# Patient Record
Sex: Female | Born: 1965 | Race: White | Hispanic: No | Marital: Married | Smoking: Never smoker
Health system: Southern US, Community
[De-identification: ages and names within clinical notes are randomized; demographics above are authoritative.]

## PROBLEM LIST (undated history)

## (undated) DIAGNOSIS — E119 Type 2 diabetes mellitus without complications: Secondary | ICD-10-CM

## (undated) DIAGNOSIS — IMO0001 Reserved for inherently not codable concepts without codable children: Secondary | ICD-10-CM

## (undated) DIAGNOSIS — G43909 Migraine, unspecified, not intractable, without status migrainosus: Secondary | ICD-10-CM

## (undated) DIAGNOSIS — K219 Gastro-esophageal reflux disease without esophagitis: Secondary | ICD-10-CM

## (undated) DIAGNOSIS — I10 Essential (primary) hypertension: Secondary | ICD-10-CM

## (undated) DIAGNOSIS — K760 Fatty (change of) liver, not elsewhere classified: Secondary | ICD-10-CM

## (undated) DIAGNOSIS — R112 Nausea with vomiting, unspecified: Secondary | ICD-10-CM

## (undated) DIAGNOSIS — Z9889 Other specified postprocedural states: Secondary | ICD-10-CM

## (undated) HISTORY — DX: Essential (primary) hypertension: I10

## (undated) HISTORY — DX: Migraine, unspecified, not intractable, without status migrainosus: G43.909

## (undated) HISTORY — PX: CHOLECYSTECTOMY: SHX55

## (undated) HISTORY — DX: Gastro-esophageal reflux disease without esophagitis: K21.9

## (undated) HISTORY — DX: Reserved for inherently not codable concepts without codable children: IMO0001

---

## 1989-02-25 HISTORY — PX: TUBAL LIGATION: SHX77

## 1997-02-25 HISTORY — PX: OTHER SURGICAL HISTORY: SHX169

## 1997-10-27 ENCOUNTER — Ambulatory Visit (HOSPITAL_COMMUNITY): Admission: RE | Admit: 1997-10-27 | Discharge: 1997-10-27 | Payer: Self-pay | Admitting: Gastroenterology

## 1997-11-30 ENCOUNTER — Encounter: Payer: Self-pay | Admitting: Gastroenterology

## 1997-11-30 ENCOUNTER — Ambulatory Visit (HOSPITAL_COMMUNITY): Admission: RE | Admit: 1997-11-30 | Discharge: 1997-11-30 | Payer: Self-pay | Admitting: Gastroenterology

## 1997-12-01 ENCOUNTER — Ambulatory Visit (HOSPITAL_COMMUNITY): Admission: RE | Admit: 1997-12-01 | Discharge: 1997-12-01 | Payer: Self-pay | Admitting: Gastroenterology

## 1997-12-01 ENCOUNTER — Encounter: Payer: Self-pay | Admitting: Gastroenterology

## 1997-12-05 ENCOUNTER — Inpatient Hospital Stay (HOSPITAL_COMMUNITY): Admission: RE | Admit: 1997-12-05 | Discharge: 1997-12-10 | Payer: Self-pay | Admitting: General Surgery

## 1998-01-24 ENCOUNTER — Ambulatory Visit (HOSPITAL_COMMUNITY): Admission: RE | Admit: 1998-01-24 | Discharge: 1998-01-24 | Payer: Self-pay | Admitting: General Surgery

## 1998-02-06 ENCOUNTER — Ambulatory Visit (HOSPITAL_COMMUNITY): Admission: RE | Admit: 1998-02-06 | Discharge: 1998-02-06 | Payer: Self-pay | Admitting: General Surgery

## 1998-02-06 ENCOUNTER — Encounter: Payer: Self-pay | Admitting: General Surgery

## 1998-07-21 ENCOUNTER — Ambulatory Visit (HOSPITAL_COMMUNITY): Admission: RE | Admit: 1998-07-21 | Discharge: 1998-07-21 | Payer: Self-pay | Admitting: Gastroenterology

## 1998-07-21 ENCOUNTER — Encounter: Payer: Self-pay | Admitting: Gastroenterology

## 1998-12-13 ENCOUNTER — Ambulatory Visit (HOSPITAL_COMMUNITY): Admission: RE | Admit: 1998-12-13 | Discharge: 1998-12-13 | Payer: Self-pay | Admitting: Gastroenterology

## 1998-12-13 ENCOUNTER — Encounter: Payer: Self-pay | Admitting: Gastroenterology

## 2000-12-12 ENCOUNTER — Other Ambulatory Visit: Admission: RE | Admit: 2000-12-12 | Discharge: 2000-12-12 | Payer: Self-pay | Admitting: Obstetrics and Gynecology

## 2000-12-24 ENCOUNTER — Ambulatory Visit (HOSPITAL_COMMUNITY): Admission: RE | Admit: 2000-12-24 | Discharge: 2000-12-24 | Payer: Self-pay | Admitting: Gastroenterology

## 2000-12-24 ENCOUNTER — Encounter (INDEPENDENT_AMBULATORY_CARE_PROVIDER_SITE_OTHER): Payer: Self-pay

## 2004-10-04 ENCOUNTER — Ambulatory Visit (HOSPITAL_COMMUNITY): Admission: RE | Admit: 2004-10-04 | Discharge: 2004-10-04 | Payer: Self-pay | Admitting: Obstetrics and Gynecology

## 2004-11-07 ENCOUNTER — Ambulatory Visit (HOSPITAL_COMMUNITY): Admission: RE | Admit: 2004-11-07 | Discharge: 2004-11-07 | Payer: Self-pay | Admitting: Obstetrics and Gynecology

## 2005-12-17 ENCOUNTER — Ambulatory Visit (HOSPITAL_COMMUNITY): Admission: RE | Admit: 2005-12-17 | Discharge: 2005-12-17 | Payer: Self-pay | Admitting: Obstetrics and Gynecology

## 2006-08-28 ENCOUNTER — Ambulatory Visit (HOSPITAL_COMMUNITY): Admission: RE | Admit: 2006-08-28 | Discharge: 2006-08-28 | Payer: Self-pay | Admitting: Family Medicine

## 2007-04-10 ENCOUNTER — Other Ambulatory Visit: Admission: RE | Admit: 2007-04-10 | Discharge: 2007-04-10 | Payer: Self-pay | Admitting: Obstetrics and Gynecology

## 2008-06-14 ENCOUNTER — Other Ambulatory Visit: Admission: RE | Admit: 2008-06-14 | Discharge: 2008-06-14 | Payer: Self-pay | Admitting: Obstetrics and Gynecology

## 2009-03-13 ENCOUNTER — Ambulatory Visit (HOSPITAL_COMMUNITY): Admission: RE | Admit: 2009-03-13 | Discharge: 2009-03-13 | Payer: Self-pay | Admitting: Obstetrics and Gynecology

## 2009-06-19 ENCOUNTER — Ambulatory Visit (HOSPITAL_COMMUNITY): Admission: RE | Admit: 2009-06-19 | Discharge: 2009-06-19 | Payer: Self-pay | Admitting: Family Medicine

## 2010-03-18 ENCOUNTER — Encounter: Payer: Self-pay | Admitting: Obstetrics and Gynecology

## 2010-07-13 NOTE — Procedures (Signed)
[facility]  Patient:    Jeanette Pierce, Jeanette Pierce Visit Number: [id number] MRN: [id number]          Service Type: Attending:  Verlin Grills, M.D. Dictated by:   Verlin Grills, M.D. Proc. Date: 12/24/00   CC:         Sharlet Salina T. Hoxworth, M.D.  Venita Lick. Pleas Koch., M.D. [facility]   Procedure Report  PROCEDURE PERFORMED:  Colonoscopy.  INDICATION FOR PROCEDURE:  Jeanette Pierce (date of birth 1965/09/26) is a 45 year old female.  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  She underwent removal of her distal ileum, cecum, ileocecal valve, appendix and distal ascending colon by Dr. Glenna Fellows.  She has also undergone a laparoscopic cholecystectomy and tubal ligation.  She is allergic to BACTRIM and ERYTHROMYCIN.  Jeanette Pierce is having right lower quadrant abdominal pain and watery, nonbloody diarrhea.  ENDOSCOPIST:  Verlin Grills, M.D.  PREMEDICATION:  Versed 10 mg, Demerol 50 mg.  ENDOSCOPE: Olympus pediatric colonoscope.  DESCRIPTION OF PROCEDURE:  After obtaining informed consent, Jeanette Pierce was placed in the left lateral decubitus position.  I administered intravenous Versed and intravenous Demerol to achieve conscious sedation for the procedure.  The patients blood pressure, oxygen saturation and cardiac rhythm were monitored throughout the procedure and documented in the medical record.  Anal inspection was normal.  Digital rectal exam was normal.  The Olympus pediatric video colonoscope was introduced into the rectum and easily advanced to what appears to be an end-to-end small bowel - right colon anastomosis. Colonic preparation for the exam today was excellent.  Rectum normal.  Sigmoid colon and descending colon normal.  Splenic flexure normal.  Transverse colon normal.  Hepatic flexure normal.  Ascending colon normal.  Ileal right colonic anastomosis normal.  Distal ileum  normal.  ASSESSMENT:  Normal proctocolonoscopy to the ileal right colonic surgical anastomosis; normal appearing distal ileum.  Multiple biopsies were taken through the length of the colon to rule out microscopic - collagenous colitis. Dictated by:   Verlin Grills, M.D. Attending:  Verlin Grills, M.D. DD:  12/24/00 TD:  12/25/00 Job: [id number]

## 2012-06-24 ENCOUNTER — Other Ambulatory Visit: Payer: Self-pay | Admitting: Obstetrics and Gynecology

## 2012-09-06 ENCOUNTER — Other Ambulatory Visit: Payer: Self-pay | Admitting: Family Medicine

## 2012-09-16 ENCOUNTER — Encounter: Payer: Self-pay | Admitting: Family Medicine

## 2012-09-16 ENCOUNTER — Ambulatory Visit (INDEPENDENT_AMBULATORY_CARE_PROVIDER_SITE_OTHER): Payer: Self-pay | Admitting: Family Medicine

## 2012-09-16 DIAGNOSIS — K219 Gastro-esophageal reflux disease without esophagitis: Secondary | ICD-10-CM

## 2012-09-16 DIAGNOSIS — G43909 Migraine, unspecified, not intractable, without status migrainosus: Secondary | ICD-10-CM | POA: Insufficient documentation

## 2012-09-16 MED ORDER — ATENOLOL 100 MG PO TABS: 100.0000 mg | ORAL_TABLET | Freq: Every day | ORAL | Status: DC

## 2012-09-16 MED ORDER — IMIPRAMINE HCL 25 MG PO TABS: ORAL_TABLET | ORAL | Status: DC

## 2012-09-16 NOTE — Progress Notes (Signed)
  Subjective:    Patient ID: Jeanette Pierce, female    DOB: 07/13/1965, 47 y.o.   MRN: [id number]  Hypertension This is a chronic problem. The current episode started more than 1 year ago. The problem is unchanged. Pertinent negatives include no anxiety, blurred vision or chest pain. There are no associated agents to hypertension. Past treatments include beta blockers. The current treatment provides moderate improvement. There are no compliance problems.  There is no history of CVA.   prophylacitic rx still helping. Mod headaches around twice per wk On grapefruit diet.cholesterol in the past--good.  Review of Systems  Eyes: Negative for blurred vision.  Cardiovascular: Negative for chest pain.   Takes ibuprofen. Four to six ibu's as need ed for ha's     Objective:   Physical Exam  Alert no acute distress. HEENT normal. Neuro exam intact. Lungs clear heart regular in rhythm      Assessment & Plan:  In impression chronic migraine headaches stable on current medication. Plan medications refilled. Diet exercise discussed. WSL

## 2012-10-02 ENCOUNTER — Telehealth: Payer: Self-pay | Admitting: Family Medicine

## 2012-10-02 NOTE — Telephone Encounter (Signed)
Discussed with pt take a home pregnancy test and carolyn will do bw papers and she can pick up on monday

## 2012-10-02 NOTE — Telephone Encounter (Signed)
Pt having headaches and nausea. Menstrual cramps but no cycle since 08/27/12. Last 2 cycles have been 2 weeks apart. She is not taking birth control and has not taken a pregnancy test. Wants to know if she can have test done for menopause.

## 2012-10-02 NOTE — Telephone Encounter (Signed)
Patient says that she doesn't know if she is starting menopause or if she is pregnant .Marland Kitchen She would like someone to call her back to determine what is going on so she doesn't have to take a test right away.

## 2012-10-05 ENCOUNTER — Other Ambulatory Visit: Payer: Self-pay | Admitting: Nurse Practitioner

## 2012-10-05 DIAGNOSIS — N912 Amenorrhea, unspecified: Secondary | ICD-10-CM

## 2012-10-05 NOTE — Telephone Encounter (Signed)
Please call patient at work [phone] and let her know when Jeanette Pierce does get her paper work done, stopped by on Monday AM to get papers but not ready, does she need to go fasting for this blood work?

## 2012-10-05 NOTE — Telephone Encounter (Signed)
Blood work papers left up front for patient pick up. Patient notified

## 2012-10-05 NOTE — Telephone Encounter (Signed)
No bloodwork for hormones does not have to be fasting.  I will order labs and send to printer.

## 2012-10-06 LAB — LUTEINIZING HORMONE: LH: 5.2 m[IU]/mL

## 2012-10-08 ENCOUNTER — Encounter: Payer: Self-pay | Admitting: Nurse Practitioner

## 2012-11-09 ENCOUNTER — Ambulatory Visit (INDEPENDENT_AMBULATORY_CARE_PROVIDER_SITE_OTHER): Payer: Self-pay | Admitting: Nurse Practitioner

## 2012-11-09 ENCOUNTER — Encounter: Payer: Self-pay | Admitting: Nurse Practitioner

## 2012-11-09 VITALS — BP 120/92 | Temp 101.1°F | Ht 62.0 in | Wt 200.6 lb

## 2012-11-09 DIAGNOSIS — J069 Acute upper respiratory infection, unspecified: Secondary | ICD-10-CM

## 2012-11-09 DIAGNOSIS — J209 Acute bronchitis, unspecified: Secondary | ICD-10-CM

## 2012-11-09 DIAGNOSIS — R0989 Other specified symptoms and signs involving the circulatory and respiratory systems: Secondary | ICD-10-CM

## 2012-11-09 MED ORDER — MEDROXYPROGESTERONE ACETATE 10 MG PO TABS: 10.0000 mg | ORAL_TABLET | Freq: Every day | ORAL | Status: DC

## 2012-11-09 MED ORDER — PREDNISONE 20 MG PO TABS: ORAL_TABLET | ORAL | Status: DC

## 2012-11-09 MED ORDER — ALBUTEROL SULFATE HFA 108 (90 BASE) MCG/ACT IN AERS: 2.0000 | INHALATION_SPRAY | RESPIRATORY_TRACT | Status: DC | PRN

## 2012-11-09 MED ORDER — ALBUTEROL SULFATE (2.5 MG/3ML) 0.083% IN NEBU
2.5000 mg | INHALATION_SOLUTION | Freq: Once | RESPIRATORY_TRACT | Status: AC
  Administered 2012-11-09: 2.5 mg via RESPIRATORY_TRACT

## 2012-11-09 MED ORDER — AMOXICILLIN 500 MG PO TABS: ORAL_TABLET | ORAL | Status: DC

## 2012-11-09 MED ORDER — METHYLPREDNISOLONE ACETATE 40 MG/ML IJ SUSP
40.0000 mg | Freq: Once | INTRAMUSCULAR | Status: AC
  Administered 2012-11-09: 40 mg via INTRAMUSCULAR

## 2012-11-09 NOTE — Patient Instructions (Signed)
Endometrial hyperplasia

## 2012-11-10 ENCOUNTER — Encounter: Payer: Self-pay | Admitting: Nurse Practitioner

## 2012-11-10 NOTE — Progress Notes (Signed)
Subjective:  Presents complaints of cough and congestion over the past week, worse over the past 3 days. Low-grade fever. Cough slightly better, producing green sputum. Some headache. Body aches. Sore throat especially with cough. No ear pain. Slight wheezing at times. No vomiting diarrhea or abdominal pain. Taking fluids well. Voiding normal limit. Some relief of cough with OTC meds.  Objective:   BP 120/92  Temp(Src) 101.1 F (38.4 C)  Ht 5\' 2"  (1.575 m)  Wt 200 lb 9.6 oz (90.992 kg)  BMI 36.68 kg/m2 NAD. Alert, oriented. TMs clear effusion, no erythema. Pharynx injected with PND noted. Neck supple with mild soft nontender adenopathy. Lungs coarse expiratory crackles noted throughout lung fields with scattered expiratory wheezes. No tachypnea. Normal color. Given albuterol 2.5 mg neb treatment. Wheezing and crackles resolved. Subjective improvement of symptoms. Heart regular rate rhythm.  Assessment:Acute bronchitis - Plan: albuterol (PROVENTIL) (2.5 MG/3ML) 0.083% nebulizer solution 2.5 mg, methylPREDNISolone acetate (DEPO-MEDROL) injection 40 mg  Reactive airway disease that is not asthma  Acute upper respiratory infections of unspecified site  Plan: Meds ordered this encounter  Medications  . predniSONE (DELTASONE) 20 MG tablet    Sig: 3 po qd x 3 d then 2 po qd x 3 d then 1 po qd x 3 d; start 9/16    Dispense:  18 tablet    Refill:  0    Order Specific Question:  Supervising Provider    Answer:  Merlyn Albert [[id number]]  . albuterol (PROVENTIL HFA;VENTOLIN HFA) 108 (90 BASE) MCG/ACT inhaler    Sig: Inhale 2 puffs into the lungs every 4 (four) hours as needed for wheezing.    Dispense:  1 Inhaler    Refill:  0    Please dispense least expensive brand; patient will be paying out of pocket    Order Specific Question:  Supervising Provider    Answer:  Merlyn Albert [[id number]]  . amoxicillin (AMOXIL) 500 MG tablet    Sig: One po TID x 10 days    Dispense:  30 tablet    Refill:  0     Order Specific Question:  Supervising Provider    Answer:  Merlyn Albert [[id number]]  . medroxyPROGESTERone (PROVERA) 10 MG tablet    Sig: Take 1 tablet (10 mg total) by mouth daily. X 7 d q 3 months if no menstrual cycle    Dispense:  7 tablet    Refill:  3    HOLD UNTIL PATIENT CALLS    Order Specific Question:  Supervising Provider    Answer:  Merlyn Albert [[id number]]  . albuterol (PROVENTIL) (2.5 MG/3ML) 0.083% nebulizer solution 2.5 mg    Sig:   . methylPREDNISolone acetate (DEPO-MEDROL) injection 40 mg    Sig:    OTC meds as directed for congestion. Warning signs reviewed. Call back in 72 hours if no improvement, sooner if worse.

## 2013-02-22 ENCOUNTER — Telehealth: Payer: Self-pay | Admitting: Family Medicine

## 2013-02-22 ENCOUNTER — Other Ambulatory Visit: Payer: Self-pay | Admitting: Nurse Practitioner

## 2013-02-22 MED ORDER — AMOXICILLIN 500 MG PO TABS: ORAL_TABLET | ORAL | Status: DC

## 2013-02-22 NOTE — Telephone Encounter (Signed)
Will call in antibiotic but recommend office visit by end of week if no better or sooner if worse.

## 2013-02-22 NOTE — Telephone Encounter (Signed)
No correct # in system (tried work #- their closed)

## 2013-02-22 NOTE — Telephone Encounter (Signed)
Patient calls with complaints of slight temperature, sore throat, cough, tightness in chest, coughing up yellowish green mucous. She said she cannot afford an OV at this time and is hoping to have something called in.  [facility]

## 2013-02-23 NOTE — Telephone Encounter (Signed)
Notified patient antibiotic was called in to pharmacy.

## 2013-03-16 ENCOUNTER — Encounter: Payer: Self-pay | Admitting: Family Medicine

## 2013-03-16 ENCOUNTER — Ambulatory Visit (INDEPENDENT_AMBULATORY_CARE_PROVIDER_SITE_OTHER): Payer: Self-pay | Admitting: Family Medicine

## 2013-03-16 VITALS — BP 154/86 | Ht 62.0 in | Wt 216.2 lb

## 2013-03-16 DIAGNOSIS — G43909 Migraine, unspecified, not intractable, without status migrainosus: Secondary | ICD-10-CM

## 2013-03-16 MED ORDER — IBUPROFEN 800 MG PO TABS: 800.0000 mg | ORAL_TABLET | Freq: Four times a day (QID) | ORAL | Status: DC | PRN

## 2013-03-16 MED ORDER — ATENOLOL 100 MG PO TABS: 100.0000 mg | ORAL_TABLET | Freq: Every day | ORAL | Status: DC

## 2013-03-16 MED ORDER — ALBUTEROL SULFATE HFA 108 (90 BASE) MCG/ACT IN AERS: 2.0000 | INHALATION_SPRAY | RESPIRATORY_TRACT | Status: DC | PRN

## 2013-03-16 MED ORDER — IMIPRAMINE HCL 25 MG PO TABS: ORAL_TABLET | ORAL | Status: DC

## 2013-03-16 NOTE — Progress Notes (Signed)
   Subjective:    Patient ID: Jeanette Pierce, female    DOB: 17-May-1965, 48 y.o.   MRN: [id number]  HPI Patient is here today for a 6 month check up on her migraines.   She states her migraines are under better control. Atenolol helping the headaches.  Works at [employer].  ibu more for the severe headches.     She would like a prescription for 800mg  Ibu.   Exercising some. But not a lot.  Review of Systems No chest pain no back pain no abdominal pain no change in bowel habits no blood in stool no rash ROS otherwise negative    Objective:   Physical Exam  Alert no apparent distress. HEENT normal. Lungs clear. Heart regular in rhythm. Blood pressure improved on repeat 140/82.      Assessment & Plan:  Impression 1 migraine headaches discussed #2 borderline blood pressure. Plan maintain same medications. Diet exercise discussed in encourage. WSL

## 2013-09-29 ENCOUNTER — Ambulatory Visit: Payer: Self-pay | Admitting: Family Medicine

## 2013-10-15 ENCOUNTER — Encounter: Payer: Self-pay | Admitting: Family Medicine

## 2013-10-15 ENCOUNTER — Ambulatory Visit (INDEPENDENT_AMBULATORY_CARE_PROVIDER_SITE_OTHER): Payer: Self-pay | Admitting: Family Medicine

## 2013-10-15 VITALS — BP 102/78 | Ht 62.0 in | Wt 212.2 lb

## 2013-10-15 DIAGNOSIS — G43119 Migraine with aura, intractable, without status migrainosus: Secondary | ICD-10-CM

## 2013-10-15 MED ORDER — IMIPRAMINE HCL 25 MG PO TABS: ORAL_TABLET | ORAL | Status: DC

## 2013-10-15 MED ORDER — ATENOLOL 100 MG PO TABS: 100.0000 mg | ORAL_TABLET | Freq: Every day | ORAL | Status: DC

## 2013-10-15 NOTE — Progress Notes (Signed)
   Subjective:    Patient ID: Jeanette Pierce, female    DOB: 1966-01-25, 48 y.o.   MRN: [id number]  HPI Patient is here for a follow up visit on her migraine headaches. Patient states that her migraines are lasting longer now. The last 2 migraines she had lasted for about 1 week.   Migraines lasting longerPatient has no other concerns at this time.   Substantial nausea and constant headache  Ibuprofen  Pain steady throb, begins with it  Gets nausea but no vomiting  Feels unsteady momentarily/    Review of Systems No loss of consciousness no seizure disorder and chest pain no neck pain no back pain no radiation to arms ROS otherwise negative    Objective:   Physical Exam  Alert talkative pleasant no acute distress. Vitals stable. Lungs clear. Heart regular in rhythm neuro exam intact      Assessment & Plan:  Impression worsening migraine headaches discussed. Patient wishes to stance same maintenance meds due to financial considerations.

## 2014-03-22 ENCOUNTER — Ambulatory Visit (INDEPENDENT_AMBULATORY_CARE_PROVIDER_SITE_OTHER): Payer: Self-pay | Admitting: Family Medicine

## 2014-03-22 ENCOUNTER — Encounter: Payer: Self-pay | Admitting: Family Medicine

## 2014-03-22 VITALS — BP 122/75 | Ht 62.0 in | Wt 215.0 lb

## 2014-03-22 DIAGNOSIS — J329 Chronic sinusitis, unspecified: Secondary | ICD-10-CM

## 2014-03-22 DIAGNOSIS — G43119 Migraine with aura, intractable, without status migrainosus: Secondary | ICD-10-CM

## 2014-03-22 MED ORDER — IMIPRAMINE HCL 25 MG PO TABS: ORAL_TABLET | ORAL | Status: DC

## 2014-03-22 MED ORDER — AMOXICILLIN 500 MG PO CAPS: 500.0000 mg | ORAL_CAPSULE | Freq: Three times a day (TID) | ORAL | Status: DC

## 2014-03-22 MED ORDER — ATENOLOL 100 MG PO TABS: 100.0000 mg | ORAL_TABLET | Freq: Every day | ORAL | Status: DC

## 2014-03-22 NOTE — Progress Notes (Signed)
   Subjective:    Patient ID: Rhea Pinkaquel H Parcel, female    DOB: 14-May-1965, 49 y.o.   MRN: [id number]  HPI Patient is here today for a 6 month check up.  She needs a refill on her meds.  migr ha's more freq, two or three in the last 6 mo that were difficult  Outlet adult color ing books  Diet disc not the best Drinking some sugar in th4 diet  Headache and sinus cong and frontal, sig coughing and hacking, taking alka selt cold and flu, still bring up green gunky xstuffe C/o cold since last Thursday. Taking OTC meds.     Review of Systems No fever no chills good cough decent appetite no chest pain no abdominal pain    Objective:   Physical Exam Alert vitals stable HET moderate nasal congestion frontal tenderness pharynx normal neck supple lungs clear heart regular rhythm       Assessment & Plan:  Impression 1 acute rhinosinusitis #2 migraine headaches fairly stable plan chronic meds refilled. Diet exercise discussed preventive measures encouraged but declined due to no insurance a mocks 500 3 times a day 10 days. WSL

## 2014-05-13 ENCOUNTER — Other Ambulatory Visit: Payer: Self-pay | Admitting: Family Medicine

## 2014-06-15 ENCOUNTER — Other Ambulatory Visit: Payer: Self-pay | Admitting: Family Medicine

## 2014-09-13 ENCOUNTER — Other Ambulatory Visit: Payer: Self-pay | Admitting: Family Medicine

## 2014-09-21 ENCOUNTER — Ambulatory Visit (INDEPENDENT_AMBULATORY_CARE_PROVIDER_SITE_OTHER): Payer: Self-pay | Admitting: Nurse Practitioner

## 2014-09-21 ENCOUNTER — Encounter: Payer: Self-pay | Admitting: Nurse Practitioner

## 2014-09-21 VITALS — BP 116/72 | Ht 62.0 in | Wt 223.0 lb

## 2014-09-21 DIAGNOSIS — G43009 Migraine without aura, not intractable, without status migrainosus: Secondary | ICD-10-CM

## 2014-09-21 MED ORDER — ATENOLOL 100 MG PO TABS
100.0000 mg | ORAL_TABLET | Freq: Every day | ORAL | Status: DC
Start: 2014-09-21 — End: 2015-03-19

## 2014-09-21 MED ORDER — IMIPRAMINE HCL 25 MG PO TABS: ORAL_TABLET | ORAL | Status: DC

## 2014-09-21 NOTE — Progress Notes (Signed)
Subjective:  Presents for recheck of her migraine headaches. Stable. Rare headache. Mild headache usually relieved with rest and a light snack. Has no insurance at this time. Defers physical and lab work.  Objective:   BP 116/72 mmHg  Ht  (1.575 m)  Wt 223 lb (101.152 kg)  BMI 40.78 kg/m2 NAD. Alert, oriented. Lungs clear. Heart regular rate rhythm.  Assessment: Migraine without aura and without status migrainosus, not intractable  Plan:  Meds ordered this encounter  Medications  . imipramine (TOFRANIL) 25 MG tablet    Sig: Take four tabs qhs    Dispense:  360 tablet    Refill:  1    Order Specific Question:  Supervising Provider    Answer:  Merlyn Albert [[id number]]  . atenolol (TENORMIN) 100 MG tablet    Sig: Take 1 tablet (100 mg total) by mouth daily.    Dispense:  90 tablet    Refill:  0    Order Specific Question:  Supervising Provider    Answer:  Merlyn Albert [[id number]]   Continue current medications as directed. If patient gets insurance, recommend preventive health physical. Return in about 6 months (around 03/24/2015) for recheck.

## 2015-03-19 ENCOUNTER — Other Ambulatory Visit: Payer: Self-pay | Admitting: Nurse Practitioner

## 2015-03-21 ENCOUNTER — Ambulatory Visit (INDEPENDENT_AMBULATORY_CARE_PROVIDER_SITE_OTHER): Payer: Self-pay | Admitting: Family Medicine

## 2015-03-21 ENCOUNTER — Encounter: Payer: Self-pay | Admitting: Family Medicine

## 2015-03-21 VITALS — BP 120/70 | Ht 62.0 in | Wt 225.2 lb

## 2015-03-21 DIAGNOSIS — G43009 Migraine without aura, not intractable, without status migrainosus: Secondary | ICD-10-CM

## 2015-03-21 MED ORDER — IMIPRAMINE HCL 25 MG PO TABS: ORAL_TABLET | ORAL | Status: DC

## 2015-03-21 MED ORDER — ATENOLOL 100 MG PO TABS: ORAL_TABLET | ORAL | Status: DC

## 2015-03-21 NOTE — Progress Notes (Signed)
   Subjective:    Patient ID: Jeanette Pierce, female    DOB: 1965-10-08, 50 y.o.   MRN: [id number]  HPI Patient is here today for a follow up visit on migraines. Patient is doing well. Patient does have some concerns about her weight.   Patient states overall chronic meds for controlling her migraines well. Has a severe 11 twice per month. Generally Motrin helps it.   Admits to virtually no exercise. Admits to admits to difficulty with snacking in the evening time.   Minimal trouble with wheezing   Review of Systems No chest pain no vomiting no diarrhea ROS otherwise negative    Objective:   Physical Exam  Alert vitals stable. HEENT normal lungs clear heart regular in rhythm neuro intact      Assessment & Plan:  Impression migraine headaches good control discussed maintain same meds #2 obesity discussed plan diet exercise discussed meds refilled recheck in 6 months WSL of note patient is not insured and she declines all preventive measures I've encouraged her to reconsider particularly important intervention such as regular mammograms etc.

## 2015-03-24 ENCOUNTER — Ambulatory Visit: Payer: Self-pay | Admitting: Nurse Practitioner

## 2015-07-20 ENCOUNTER — Other Ambulatory Visit: Payer: Self-pay | Admitting: Family Medicine

## 2015-09-17 ENCOUNTER — Other Ambulatory Visit: Payer: Self-pay | Admitting: Family Medicine

## 2015-09-18 ENCOUNTER — Ambulatory Visit (INDEPENDENT_AMBULATORY_CARE_PROVIDER_SITE_OTHER): Payer: Self-pay | Admitting: Family Medicine

## 2015-09-18 ENCOUNTER — Encounter: Payer: Self-pay | Admitting: Family Medicine

## 2015-09-18 VITALS — BP 122/82 | Ht 62.0 in | Wt 210.6 lb

## 2015-09-18 DIAGNOSIS — G43009 Migraine without aura, not intractable, without status migrainosus: Secondary | ICD-10-CM

## 2015-09-18 MED ORDER — IBUPROFEN 800 MG PO TABS: 800.0000 mg | ORAL_TABLET | Freq: Four times a day (QID) | ORAL | 2 refills | Status: DC | PRN

## 2015-09-18 MED ORDER — ATENOLOL 100 MG PO TABS: ORAL_TABLET | ORAL | 1 refills | Status: DC

## 2015-09-18 MED ORDER — IMIPRAMINE HCL 25 MG PO TABS: ORAL_TABLET | ORAL | 1 refills | Status: DC

## 2015-09-18 NOTE — Progress Notes (Signed)
   Subjective:    Patient ID: Jeanette Pierce, female    DOB: [birth year], 50 y.o.   MRN: [id number]  HPI  Patient arrives for a follow up on migraines. Patient states she has been doing well but has noticed more headaches during the heat  Takes both meds qhs, if delays by an hour or tow   Rarely misses  Uses ibu 800 prn for an actual ha   Not much nausea  Not bad lately    Review of Systems No headache, no major weight loss or weight gain, no chest pain no back pain abdominal pain no change in bowel habits complete ROS otherwise negative     Objective:   Physical Exam Alert vital stable no acute distress blood pressure good on repeat HEENT normal lungs clear heart regular rate and rhythm neuro intact       Assessment & Plan:  Impression migraine headaches overall decent control plan compliance discussed medications refilled diet exercise discussed recheck in 6 months WSL

## 2015-09-19 ENCOUNTER — Other Ambulatory Visit: Payer: Self-pay | Admitting: Family Medicine

## 2016-02-09 ENCOUNTER — Other Ambulatory Visit: Payer: Self-pay | Admitting: Family Medicine

## 2016-02-09 DIAGNOSIS — Z1231 Encounter for screening mammogram for malignant neoplasm of breast: Secondary | ICD-10-CM

## 2016-02-24 ENCOUNTER — Other Ambulatory Visit: Payer: Self-pay | Admitting: Family Medicine

## 2016-02-27 NOTE — Telephone Encounter (Signed)
Ok times three mo since stable

## 2016-03-04 ENCOUNTER — Encounter (HOSPITAL_COMMUNITY): Payer: Self-pay | Admitting: Radiology

## 2016-03-04 ENCOUNTER — Ambulatory Visit (HOSPITAL_COMMUNITY)
Admission: RE | Admit: 2016-03-04 | Discharge: 2016-03-04 | Disposition: A | Discharge status: Home / Self Care | Payer: Self-pay | Source: Ambulatory Visit | Attending: Family Medicine | Admitting: Family Medicine

## 2016-03-04 DIAGNOSIS — Z1231 Encounter for screening mammogram for malignant neoplasm of breast: Secondary | ICD-10-CM | POA: Insufficient documentation

## 2016-03-18 ENCOUNTER — Ambulatory Visit: Payer: Self-pay | Admitting: Family Medicine

## 2016-03-25 ENCOUNTER — Encounter: Payer: Self-pay | Admitting: Family Medicine

## 2016-03-25 ENCOUNTER — Ambulatory Visit (INDEPENDENT_AMBULATORY_CARE_PROVIDER_SITE_OTHER): Payer: Self-pay | Admitting: Family Medicine

## 2016-03-25 VITALS — BP 124/76 | Ht 62.0 in | Wt 226.0 lb

## 2016-03-25 DIAGNOSIS — G43009 Migraine without aura, not intractable, without status migrainosus: Secondary | ICD-10-CM

## 2016-03-25 MED ORDER — ATENOLOL 100 MG PO TABS: ORAL_TABLET | ORAL | 1 refills | Status: DC

## 2016-03-25 MED ORDER — IMIPRAMINE HCL 25 MG PO TABS: 100.0000 mg | ORAL_TABLET | Freq: Every day | ORAL | 1 refills | Status: DC

## 2016-03-25 NOTE — Progress Notes (Signed)
   Subjective:    Patient ID: Jeanette Pierce, female    DOB: 02/09/1966, 51 y.o.   MRN: [id number]  Hypertension  This is a chronic problem. Compliance problems include exercise and diet.   Actually patient takes atenolol primarily for control of migraine headaches and not blood pressure  migr stil under good control Acid reflux. Taking tums. Sig these days. Pt takes tums, helps quickly, goes away.  Cycles are off. Every other month all last yr,  No cycle from October to January. Mid jan had a cycle that was rather solid ,       Review of Systems No headache, no major weight loss or weight gain, no chest pain no back pain abdominal pain no change in bowel habits complete ROS otherwise negative     Objective:   Physical Exam Alert vitals stable, NAD. Blood pressure good on repeat. HEENT normal. Lungs clear. Heart regular rate and rhythm.        Assessment & Plan:  Impression #1 migraine headaches good control #3 irregular cycles with occasional hot flashes likely nearing menopause discussed plan medications refilled. 15 minutes spent most in discussion. Follow-up in 6 months declines blood work due to no insurance status

## 2016-09-03 ENCOUNTER — Other Ambulatory Visit: Payer: Self-pay | Admitting: Family Medicine

## 2016-09-03 NOTE — Telephone Encounter (Signed)
Last seen 03/25/16 for Migraine

## 2016-09-16 ENCOUNTER — Encounter: Payer: Self-pay | Admitting: Family Medicine

## 2016-09-16 ENCOUNTER — Ambulatory Visit (INDEPENDENT_AMBULATORY_CARE_PROVIDER_SITE_OTHER): Payer: Self-pay | Admitting: Family Medicine

## 2016-09-16 VITALS — BP 124/96 | Ht 62.0 in | Wt 227.0 lb

## 2016-09-16 DIAGNOSIS — G43109 Migraine with aura, not intractable, without status migrainosus: Secondary | ICD-10-CM

## 2016-09-16 MED ORDER — IMIPRAMINE HCL 25 MG PO TABS: 100.0000 mg | ORAL_TABLET | Freq: Every day | ORAL | 1 refills | Status: DC

## 2016-09-16 MED ORDER — ATENOLOL 100 MG PO TABS: ORAL_TABLET | ORAL | 1 refills | Status: DC

## 2016-09-16 NOTE — Progress Notes (Signed)
   Subjective:    Patient ID: Jeanette Pierce, female    DOB: 08/10/1965, 51 y.o.   MRN: [id number]  Hypertension  This is a recurrent problem. The current episode started more than 1 year ago.  Is a    : A he should supplement patient is taking in hopes of helping lose weight  Patient states she is here for migraines and states she does not diet, and does not exercise as she needs to.  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 Blood pressure good on repeat. HEENT normal. Lungs clear. Heart regular rate and rhythm.        Assessment & Plan:  Imp migr h a's good control, meds refilled exrcise disc

## 2017-03-19 ENCOUNTER — Ambulatory Visit: Payer: Self-pay | Admitting: Family Medicine

## 2017-03-19 ENCOUNTER — Encounter: Payer: Self-pay | Admitting: Family Medicine

## 2017-03-19 VITALS — BP 134/90 | Ht 62.0 in | Wt 235.0 lb

## 2017-03-19 DIAGNOSIS — G43109 Migraine with aura, not intractable, without status migrainosus: Secondary | ICD-10-CM

## 2017-03-19 MED ORDER — ATENOLOL 100 MG PO TABS: ORAL_TABLET | ORAL | 3 refills | Status: DC

## 2017-03-19 MED ORDER — IMIPRAMINE HCL 25 MG PO TABS: 100.0000 mg | ORAL_TABLET | Freq: Every day | ORAL | 3 refills | Status: DC

## 2017-03-19 NOTE — Progress Notes (Signed)
   Subjective:    Patient ID: Jeanette Pierce H Amore, female    DOB: 1965-05-31, 52 y.o.   MRN: [id number]  Hypertension  This is a chronic problem. Compliance problems include diet and exercise.    Pt declines flu vaccine.   Pt states no concerns today.    Two or three times per month gets migraines h a's  Now wearing new glasses   Pt not exercising    Review of Systems No headache, no major weight loss or weight gain, no chest pain no back pain abdominal pain no change in bowel habits complete ROS otherwise negative     Objective:   Physical Exam Alert vitals stable, NAD. Blood pressure good on repeat. HEENT normal. Lungs clear. Heart regular rate and rhythm.        Assessment & Plan:  Impression migraine headaches.  Medication definitely helping.  No negative side effects.  Diet exercise discussed.  Patient declines all preventive screening mammograms future colonoscopy and physicals etc.

## 2017-05-24 LAB — POCT GLYCOSYLATED HEMOGLOBIN (HGB A1C)

## 2017-05-24 LAB — GLUCOSE, POCT (MANUAL RESULT ENTRY): POC GLUCOSE: 232 mg/dL — AB (ref 70–99)

## 2017-05-30 ENCOUNTER — Encounter: Payer: Self-pay | Admitting: Nurse Practitioner

## 2017-05-30 ENCOUNTER — Ambulatory Visit: Payer: Self-pay | Admitting: Nurse Practitioner

## 2017-05-30 VITALS — BP 132/80 | Ht 62.0 in | Wt 236.4 lb

## 2017-05-30 DIAGNOSIS — Z79899 Other long term (current) drug therapy: Secondary | ICD-10-CM

## 2017-05-30 DIAGNOSIS — E119 Type 2 diabetes mellitus without complications: Secondary | ICD-10-CM

## 2017-05-30 DIAGNOSIS — K219 Gastro-esophageal reflux disease without esophagitis: Secondary | ICD-10-CM

## 2017-05-30 DIAGNOSIS — Z13220 Encounter for screening for lipoid disorders: Secondary | ICD-10-CM

## 2017-05-30 MED ORDER — METFORMIN HCL ER 500 MG PO TB24: 500.0000 mg | ORAL_TABLET | Freq: Every day | ORAL | 2 refills | Status: DC

## 2017-05-30 NOTE — Progress Notes (Signed)
Subjective:    Patient ID: Rhea Pink, female    DOB: Feb 20, 1966, 52 y.o.   MRN: [id number]  HPI Patient presents today after attending a health screening through her employer that demonstrated a Hgb A1C of 6.5 and a random glucose testing of 232. She has never been diagnosed with diabetes. She denies polyphagia, polydipsia, or polyuria. She denies numbness or tingling in BLE. She denies chest pain or shortness of breath. Last eye exam was performed at the end of 2018. Last Dental exam at the beginning of 2018. Patient complaints of epigastric pain. She reports often eating late at night but denies smoking, caffeine intake, or alcohol use. She uses tums 2-3 times per week that alleviates the symptoms.   Past Medical History:  Diagnosis Date  . Hypertension   . Migraine headache   . Reflux    Past Surgical History:  Procedure Laterality Date  . CHOLECYSTECTOMY  before 1999  . iliocolotomy  1999  . TUBAL LIGATION  1991   Family History  Problem Relation Age of Onset  . Heart disease Mother   . COPD Father   . Asthma Father   . Cancer Maternal Grandfather        colon and stomach   Social History   Tobacco Use  . Smoking status: Never Smoker  . Smokeless tobacco: Never Used  Substance Use Topics  . Alcohol use: Not on file  . Drug use: Not on file   Occupation: Recently unemployed Living Environment: Lives with husband at home Caffeine: Minimal caffeine use.  Diet: Breakfast is usually cereal or oatmeal. Lunch is often a salad or sandwich. Dinner is often skipped and eats late night snacks.  Exercise: Denies any current physical activity  Allergies  Allergen Reactions  . Erythromycin     Nausea, vomiting    Current Outpatient Medications:  .  atenolol (TENORMIN) 100 MG tablet, TAKE 1 TABLET (100 MG TOTAL) BY MOUTH DAILY., Disp: 90 tablet, Rfl: 3 .  ibuprofen (ADVIL,MOTRIN) 800 MG tablet, TAKE 1 TABLET BY MOUTH EVERY 6 HOURS AS NEEDED, Disp: 36 tablet, Rfl: 2 .   imipramine (TOFRANIL) 25 MG tablet, Take 4 tablets (100 mg total) by mouth at bedtime., Disp: 360 tablet, Rfl: 3 .  Rizatriptan Benzoate (MAXALT PO), Take 10 mg by mouth. , Disp: , Rfl:    Review of Systems General: Pt reports adequate level with a regular sleeping pattern. Pt denies unexplained weight loss or weight gain.  Cardiac: Denies chest pain, pressure, or palpitations. Pt denies syncope or dizziness.  Pulmonary: Denies shortness of breath, dyspnea, or cough.   Endocrine: Denies polydipsia, polyphagia, polyuria. Also denies intolerance or sensitivity to heat or cold.       Objective:   Physical Exam  Vitals:   05/30/17 1006  BP: 132/80     General: WDWN. Appears calm and well-groomed. Dressed appropriate for weather. Thyroid: Midline and non tender with no visible enlargement, nodules, or masses on palpation.  Pulmonary: Chest expansion symmetrical. No use of accessory muscles. lungs without adventitious breath sounds on auscultation.  Cardiovascular: RRR. S1 S2 intact. No murmurs or gallops. Pulses: dorsalis pedis and posterior tibial +2. No peripheral edema.  Abdomen: Soft with mild tenderness over gastric area. No masses or organomegaly noted on palpation.   Diabetic Foot Exam - Simple   Simple Foot Form Diabetic Foot exam was performed with the following findings:  Yes 05/30/2017  1:05 PM  Visual Inspection No deformities, no ulcerations, no other  skin breakdown bilaterally:  Yes Sensation Testing Intact to touch and monofilament testing bilaterally:  Yes Pulse Check Posterior Tibialis and Dorsalis pulse intact bilaterally:  Yes Comments        Assessment & Plan:   Problem List Items Addressed This Visit      Digestive   Esophageal reflux     Endocrine   Type 2 diabetes mellitus without complication, without long-term current use of insulin (HCC) - Primary   Relevant Medications   metFORMIN (GLUCOPHAGE-XR) 500 MG 24 hr tablet    Other Visit Diagnoses     High risk medication use       Relevant Orders   Hepatic function panel   Basic metabolic panel   TSH   Lipid screening       Relevant Orders   Lipid panel       Plan -Labs: Lipid panel, liver function, BMP, and TSH. Limited labs due to lack of insurance.  -Medications: Begin taking metformin. Potential ADE discussed. -Patient teaching: Patient pap smear 2-3 years over due. Discussed with patient the importance of receiving a pap smear, but patient unable to afford at this time due to recent unemployment. Healthy diet, increasing activity, and reducing stress discussed. Decrease sugar and simple carbs in diet. Given Rx for glucose testing machine and supplies. Check sugar at least 3 times per week, record results and call back in a few weeks if no improvement.  Bring record to next visit.  -Follow Up: Return in 3 months for recheck  Return in about 3 months (around 08/29/2017).

## 2017-05-30 NOTE — Patient Instructions (Addendum)
Diabetes Mellitus and Nutrition When you have diabetes (diabetes mellitus), it is very important to have healthy eating habits because your blood sugar (glucose) levels are greatly affected by what you eat and drink. Eating healthy foods in the appropriate amounts, at about the same times every day, can help you:  Control your blood glucose.  Lower your risk of heart disease.  Improve your blood pressure.  Reach or maintain a healthy weight.  Every person with diabetes is different, and each person has different needs for a meal plan. Your health care provider may recommend that you work with a diet and nutrition specialist (dietitian) to make a meal plan that is best for you. Your meal plan may vary depending on factors such as:  The calories you need.  The medicines you take.  Your weight.  Your blood glucose, blood pressure, and cholesterol levels.  Your activity level.  Other health conditions you have, such as heart or kidney disease.  How do carbohydrates affect me? Carbohydrates affect your blood glucose level more than any other type of food. Eating carbohydrates naturally increases the amount of glucose in your blood. Carbohydrate counting is a method for keeping track of how many carbohydrates you eat. Counting carbohydrates is important to keep your blood glucose at a healthy level, especially if you use insulin or take certain oral diabetes medicines. It is important to know how many carbohydrates you can safely have in each meal. This is different for every person. Your dietitian can help you calculate how many carbohydrates you should have at each meal and for snack. Foods that contain carbohydrates include:  Bread, cereal, rice, pasta, and crackers.  Potatoes and corn.  Peas, beans, and lentils.  Milk and yogurt.  Fruit and juice.  Desserts, such as cakes, cookies, ice cream, and candy.  How does alcohol affect me? Alcohol can cause a sudden decrease in blood  glucose (hypoglycemia), especially if you use insulin or take certain oral diabetes medicines. Hypoglycemia can be a life-threatening condition. Symptoms of hypoglycemia (sleepiness, dizziness, and confusion) are similar to symptoms of having too much alcohol. If your health care provider says that alcohol is safe for you, follow these guidelines:  Limit alcohol intake to no more than 1 drink per day for nonpregnant women and 2 drinks per day for men. One drink equals 12 oz of beer, 5 oz of wine, or 1 oz of hard liquor.  Do not drink on an empty stomach.  Keep yourself hydrated with water, diet soda, or unsweetened iced tea.  Keep in mind that regular soda, juice, and other mixers may contain a lot of sugar and must be counted as carbohydrates.  What are tips for following this plan? Reading food labels  Start by checking the serving size on the label. The amount of calories, carbohydrates, fats, and other nutrients listed on the label are based on one serving of the food. Many foods contain more than one serving per package.  Check the total grams (g) of carbohydrates in one serving. You can calculate the number of servings of carbohydrates in one serving by dividing the total carbohydrates by 15. For example, if a food has 30 g of total carbohydrates, it would be equal to 2 servings of carbohydrates.  Check the number of grams (g) of saturated and trans fats in one serving. Choose foods that have low or no amount of these fats.  Check the number of milligrams (mg) of sodium in one serving. Most people   the number of grams (g) of saturated and trans fats in one serving. Choose foods that have low or no amount of these fats.  · Check the number of milligrams (mg) of sodium in one serving. Most people should limit total sodium intake to less than 2,300 mg per day.  · Always check the nutrition information of foods labeled as "low-fat" or "nonfat". These foods may be higher in added sugar or refined carbohydrates and should be avoided.  · Talk to your dietitian to identify your daily goals for nutrients listed on the label.  Shopping  · Avoid buying canned, premade, or processed foods. These  foods tend to be high in fat, sodium, and added sugar.  · Shop around the outside edge of the grocery store. This includes fresh fruits and vegetables, bulk grains, fresh meats, and fresh dairy.  Cooking  · Use low-heat cooking methods, such as baking, instead of high-heat cooking methods like deep frying.  · [place] using healthy oils, such as olive, canola, or sunflower oil.  · Avoid cooking with butter, cream, or high-fat meats.  Meal planning  · Eat meals and snacks regularly, preferably at the same times every day. Avoid going long periods of time without eating.  · Eat foods high in fiber, such as fresh fruits, vegetables, beans, and whole grains. Talk to your dietitian about how many servings of carbohydrates you can eat at each meal.  · Eat 4-6 ounces of lean protein each day, such as lean meat, chicken, fish, eggs, or tofu. 1 ounce is equal to 1 ounce of meat, chicken, or fish, 1 egg, or 1/4 cup of tofu.  · Eat some foods each day that contain healthy fats, such as avocado, nuts, seeds, and fish.  Lifestyle    · Check your blood glucose regularly.  · Exercise at least 30 minutes 5 or more days each week, or as told by your health care provider.  · Take medicines as told by your health care provider.  · Do not use any products that contain nicotine or tobacco, such as cigarettes and e-cigarettes. If you need help quitting, ask your health care provider.  · Work with a counselor or diabetes educator to identify strategies to manage stress and any emotional and social challenges.  What are some questions to ask my health care provider?  · Do I need to meet with a diabetes educator?  · Do I need to meet with a dietitian?  · What number can I call if I have questions?  · When are the best times to check my blood glucose?  Where to find more information:  · American Diabetes Association: diabetes.org/food-and-fitness/food  · Academy of Nutrition and Dietetics:  www.eatright.org/resources/health/diseases-and-conditions/diabetes  · National Institute of Diabetes and Digestive and Kidney Diseases (NIH): www.niddk.nih.gov/health-information/diabetes/overview/diet-eating-physical-activity  Summary  · A healthy meal plan will help you control your blood glucose and maintain a healthy lifestyle.  · Working with a diet and nutrition specialist (dietitian) can help you make a meal plan that is best for you.  · Keep in mind that carbohydrates and alcohol have immediate effects on your blood glucose levels. It is important to count carbohydrates and to use alcohol carefully.  This information is not intended to replace advice given to you by your health care provider. Make sure you discuss any questions you have with your health care provider.  Document Released: 11/08/2004 Document Revised: 03/18/2016 Document Reviewed: 03/18/2016  Elsevier Interactive Patient Education © 2018 Elsevier Inc.      This helps you identify foods that cause symptoms.  Avoid foods that cause symptoms. These may be different for everyone.  Eat small meals often instead of 3 large meals a day.  Eat your meals slowly, in a place where you are relaxed.  Limit fried foods.  Sneeringer foods using methods other than frying.  Avoid drinking alcohol.  Avoid drinking large amounts of liquids with your meals.  Avoid bending over or lying down until 2-3 hours after eating. What foods are not recommended? These are some foods and drinks  that may make your symptoms worse: Vegetables Tomatoes. Tomato juice. Tomato and spaghetti sauce. Chili peppers. Onion and garlic. Horseradish. Fruits Oranges, grapefruit, and lemon (fruit and juice). Meats High-fat meats, fish, and poultry. This includes hot dogs, ribs, ham, sausage, salami, and bacon. Dairy Whole milk and chocolate milk. Sour cream. Cream. Butter. Ice cream. Cream cheese. Drinks Coffee and tea. Bubbly (carbonated) drinks or energy drinks. Condiments Hot sauce. Barbecue sauce. Sweets/Desserts Chocolate and cocoa. Donuts. Peppermint and spearmint. Fats and Oils High-fat foods. This includes [place] fries and potato chips. Other Vinegar. Strong spices. This includes black pepper, white pepper, red pepper, cayenne, curry powder, cloves, ginger, and chili powder. The items listed above may not be a complete list of foods and drinks to avoid. Contact your dietitian for more information. This information is not intended to replace advice given to you by your health care provider. Make sure you discuss any questions you have with your health care provider. Document Released: 08/13/2011 Document Revised: 07/20/2015 Document Reviewed: 12/16/2012 Elsevier Interactive Patient Education  2017 [employer]

## 2017-05-31 ENCOUNTER — Encounter: Payer: Self-pay | Admitting: Nurse Practitioner

## 2017-06-04 ENCOUNTER — Other Ambulatory Visit: Payer: Self-pay | Admitting: Family Medicine

## 2017-06-04 LAB — LIPID PANEL
Chol/HDL Ratio: 3.5 ratio (ref 0.0–4.4)
Cholesterol, Total: 203 mg/dL — ABNORMAL HIGH (ref 100–199)
HDL: 58 mg/dL (ref >39)
LDL CALC: 113 mg/dL — AB (ref 0–99)
TRIGLYCERIDES: 162 mg/dL — AB (ref 0–149)
VLDL Cholesterol Cal: 32 mg/dL (ref 5–40)

## 2017-06-04 LAB — HEPATIC FUNCTION PANEL
ALT: 87 IU/L — AB (ref 0–32)
AST: 44 IU/L — ABNORMAL HIGH (ref 0–40)
Albumin: 4.4 g/dL (ref 3.5–5.5)
Alkaline Phosphatase: 61 IU/L (ref 39–117)
BILIRUBIN TOTAL: 0.3 mg/dL (ref 0.0–1.2)
Bilirubin, Direct: 0.1 mg/dL (ref 0.00–0.40)
TOTAL PROTEIN: 6.8 g/dL (ref 6.0–8.5)

## 2017-06-04 LAB — TSH: TSH: 2.13 u[IU]/mL (ref 0.450–4.500)

## 2017-06-04 LAB — BASIC METABOLIC PANEL
BUN/Creatinine Ratio: 23 (ref 9–23)
BUN: 17 mg/dL (ref 6–24)
CALCIUM: 9.3 mg/dL (ref 8.7–10.2)
CHLORIDE: 103 mmol/L (ref 96–106)
CO2: 23 mmol/L (ref 20–29)
CREATININE: 0.74 mg/dL (ref 0.57–1.00)
GFR calc non Af Amer: 95 mL/min/{1.73_m2} (ref >59)
GFR, EST AFRICAN AMERICAN: 110 mL/min/{1.73_m2} (ref >59)
Glucose: 112 mg/dL — ABNORMAL HIGH (ref 65–99)
Potassium: 4.7 mmol/L (ref 3.5–5.2)
Sodium: 141 mmol/L (ref 134–144)

## 2017-06-09 ENCOUNTER — Telehealth: Payer: Self-pay | Admitting: Nurse Practitioner

## 2017-06-09 NOTE — Telephone Encounter (Signed)
Called pt. See result note.

## 2017-06-09 NOTE — Telephone Encounter (Signed)
Patient is calling to get results to her recent lab work.

## 2017-08-25 ENCOUNTER — Encounter: Payer: Self-pay | Admitting: Nurse Practitioner

## 2017-08-25 ENCOUNTER — Ambulatory Visit: Payer: Self-pay | Admitting: Nurse Practitioner

## 2017-08-25 VITALS — BP 122/88 | Ht 62.0 in | Wt 236.0 lb

## 2017-08-25 DIAGNOSIS — E119 Type 2 diabetes mellitus without complications: Secondary | ICD-10-CM

## 2017-08-25 LAB — POCT GLYCOSYLATED HEMOGLOBIN (HGB A1C): Hemoglobin A1C: 6.2 % — AB (ref 4.0–5.6)

## 2017-08-26 ENCOUNTER — Encounter: Payer: Self-pay | Admitting: Nurse Practitioner

## 2017-08-26 NOTE — Progress Notes (Signed)
Subjective:  Presents for recheck on her diabetes. Now walking about 2-3 miles 2-3 times per week. Has her glucose log. All of her sugars fasting and non fasting are below 130. Fasting averaging low 100s. Doing well with diet. Stopped Metformin due to concerns about side effects after talking to some friends. Mainly concerned about GI effects.   Objective:   BP 122/88   Ht 5\' 2"  (1.575 m)   Wt 236 lb (107 kg)   BMI 43.16 kg/m  NAD. Alert, oriented. Lungs clear. Heart RRR.  Results for orders placed or performed in visit on 08/25/17  POCT glycosylated hemoglobin (Hb A1C)  Result Value Ref Range   Hemoglobin A1C 6.2 (A) 4.0 - 5.6 %   HbA1c POC (<> result, manual entry)  4.0 - 5.6 %   HbA1c, POC (prediabetic range)  5.7 - 6.4 %   HbA1c, POC (controlled diabetic range)  0.0 - 7.0 %     Assessment:   Problem List Items Addressed This Visit      Endocrine   Type 2 diabetes mellitus without complication, without long-term current use of insulin (HCC) - Primary   Relevant Orders   POCT glycosylated hemoglobin (Hb A1C) (Completed)       Plan:  Encouraged continued healthy lifestyle changes. Discussed potential adverse effects with Metformin. Decreased risk of GI issues with coated XR tablet. Also this may help her with weight loss. If she decides to take medicine, stop if any issues.  Hold on urine microalbumin ratio at this point due to costs.  Return in about 4 months (around 12/26/2017).

## 2017-08-29 ENCOUNTER — Ambulatory Visit: Payer: Self-pay | Admitting: Nurse Practitioner

## 2017-12-29 ENCOUNTER — Encounter: Payer: Self-pay | Admitting: Family Medicine

## 2017-12-29 ENCOUNTER — Ambulatory Visit (INDEPENDENT_AMBULATORY_CARE_PROVIDER_SITE_OTHER): Payer: Self-pay | Admitting: Family Medicine

## 2017-12-29 VITALS — BP 124/86 | Ht 62.0 in | Wt 236.0 lb

## 2017-12-29 DIAGNOSIS — E119 Type 2 diabetes mellitus without complications: Secondary | ICD-10-CM

## 2017-12-29 LAB — POCT GLYCOSYLATED HEMOGLOBIN (HGB A1C): Hemoglobin A1C: 6.2 % — AB (ref 4.0–5.6)

## 2017-12-29 MED ORDER — IMIPRAMINE HCL 25 MG PO TABS: 100.0000 mg | ORAL_TABLET | Freq: Every day | ORAL | 0 refills | Status: DC

## 2017-12-29 NOTE — Progress Notes (Signed)
   Subjective:    Patient ID: Jeanette Pierce, female    DOB: 07-26-1965, 52 y.o.   MRN: [id number]  Diabetes  She presents for her follow-up diabetic visit. She has type 2 diabetes mellitus. Risk factors for coronary artery disease include diabetes mellitus. Current diabetic treatment includes diet. She is compliant with treatment all of the time. Her weight is stable. She is following a diabetic diet. She has not had a previous visit with a dietitian. She does not see a podiatrist.Eye exam is not current.   Patient states she would like to wean off of her migraine medication due to side effects  Results for orders placed or performed in visit on 12/29/17  POCT glycosylated hemoglobin (Hb A1C)  Result Value Ref Range   Hemoglobin A1C 6.2 (A) 4.0 - 5.6 %   HbA1c POC (<> result, manual entry)     HbA1c, POC (prediabetic range)     HbA1c, POC (controlled diabetic range)     Patient brings in long list of potential side effects associated with imipramine.  Le.  Would like to get off the medicine.  Patient also states that she does not believe she has diabetes.  Had an A1c at 6.5% had a health fair this spring.  Has not taken metformin.  Review of Systems No headache, no major weight loss or weight gain, no chest pain no back pain abdominal pain no change in bowel habits complete ROS otherwise negative     Objective:   Physical Exam  Alert vitals stable, NAD. Blood pressure good on repeat. HEENT normal. Lungs clear. Heart regular rate and rhythm.       Assessment & Plan:  Impression type 2 diabetes.  A1c better.  Patient rejecting diagnosis of  2.  Migraine headaches with desire to gout.  Will maintain Imitrex beta-blocker rationale discussed approach given

## 2017-12-29 NOTE — Patient Instructions (Signed)
Each month dro your imipramine by 25 mg. tis is a very slow and cautious drop, and a good idea, and if it recurs on this we might need to send you to a migraine specialist

## 2018-03-23 ENCOUNTER — Other Ambulatory Visit: Payer: Self-pay | Admitting: Family Medicine

## 2018-05-11 ENCOUNTER — Other Ambulatory Visit: Payer: Self-pay | Admitting: Family Medicine

## 2018-05-14 MED ORDER — IMIPRAMINE HCL 25 MG PO TABS: 100.0000 mg | ORAL_TABLET | Freq: Every day | ORAL | 0 refills | Status: DC

## 2018-05-14 NOTE — Addendum Note (Signed)
Addended by: Margaretha Sheffield on: 05/14/2018 09:18 AM   Modules accepted: Orders

## 2018-06-09 ENCOUNTER — Other Ambulatory Visit: Payer: Self-pay | Admitting: Family Medicine

## 2018-06-29 ENCOUNTER — Ambulatory Visit: Payer: Self-pay | Admitting: Family Medicine

## 2018-06-30 ENCOUNTER — Other Ambulatory Visit: Payer: Self-pay

## 2018-06-30 ENCOUNTER — Ambulatory Visit (INDEPENDENT_AMBULATORY_CARE_PROVIDER_SITE_OTHER): Payer: Self-pay | Admitting: Family Medicine

## 2018-06-30 ENCOUNTER — Encounter: Payer: Self-pay | Admitting: Family Medicine

## 2018-06-30 DIAGNOSIS — G43109 Migraine with aura, not intractable, without status migrainosus: Secondary | ICD-10-CM

## 2018-06-30 DIAGNOSIS — E119 Type 2 diabetes mellitus without complications: Secondary | ICD-10-CM

## 2018-06-30 MED ORDER — ATENOLOL 100 MG PO TABS: 100.0000 mg | ORAL_TABLET | Freq: Every day | ORAL | 1 refills | Status: DC

## 2018-06-30 MED ORDER — IMIPRAMINE HCL 25 MG PO TABS: 100.0000 mg | ORAL_TABLET | Freq: Every day | ORAL | 5 refills | Status: DC

## 2018-06-30 NOTE — Progress Notes (Signed)
   Subjective:    Patient ID: Jeanette Pierce, female    DOB: 1965-07-10, 53 y.o.   MRN: [id number]  Diabetes  She presents for her follow-up diabetic visit. She has type 2 diabetes mellitus. Risk factors for coronary artery disease include hypertension. Current diabetic treatment includes diet. Her weight is stable. She is following a diabetic diet.   Virtual Visit via Video Note  I connected with Jeanette Pierce on 06/30/18 at 10:00 AM EDT by a video enabled telemedicine application and verified that I am speaking with the correct person using two identifiers.  Location: Patient: home Provider: office   I discussed the limitations of evaluation and management by telemedicine and the availability of in person appointments. The patient expressed understanding and agreed to proceed.  History of Present Illness:    Observations/Objective:   Assessment and Plan:   Follow Up Instructions:    I discussed the assessment and treatment plan with the patient. The patient was provided an opportunity to ask questions and all were answered. The patient agreed with the plan and demonstrated an understanding of the instructions.   The patient was advised to call back or seek an in-person evaluation if the symptoms worsen or if the condition fails to improve as anticipated.  I provided18 minutes of non-face-to-face time during this encounter.     Patient attempting to watch her diet.  Has cut down her sugars.  Exercising fair amount.  Generally sugars in decent control.  Reports migraines stable.  Was unable to come off the imipramine.  Definitely would like to maintain.  Review of Systems No headache, no major weight loss or weight gain, no chest pain no back pain abdominal pain no change in bowel habits complete ROS otherwise negative     Objective:   Physical Exam  Virtual visit      Assessment & Plan:  Impression 1 type 2 diabetes good control discussed maintain same meds  2.   Migraine headaches.  Chronic.  Tolerated well with meds to maintain same follow-up in 6 months

## 2018-09-28 ENCOUNTER — Telehealth: Payer: Self-pay | Admitting: *Deleted

## 2018-09-28 MED ORDER — FLUCONAZOLE 150 MG PO TABS: 150.0000 mg | ORAL_TABLET | ORAL | 0 refills | Status: DC

## 2018-09-28 MED ORDER — KETOCONAZOLE 2 % EX CREA: 1.0000 "application " | TOPICAL_CREAM | Freq: Two times a day (BID) | CUTANEOUS | 1 refills | Status: DC

## 2018-09-28 NOTE — Telephone Encounter (Signed)
Patient calls and states she thinks she is dealing with a terrible yeast infection. Patient states she is completely ray in vaginal and rectal are and burns when se pees and poops and bleeds when she wipes. Patient states she is also having nausea and metallic taste in mouth and dizziness.  Patient uses [facility] in [place]

## 2018-09-28 NOTE — Telephone Encounter (Signed)
Diflucan 150 numb three one every other day. Ketoconazole 30 g bid to affected skin one ref

## 2018-09-28 NOTE — Telephone Encounter (Signed)
Prescriptions sent electronically to pharmacy. Patient notified. °

## 2018-09-30 ENCOUNTER — Ambulatory Visit (INDEPENDENT_AMBULATORY_CARE_PROVIDER_SITE_OTHER): Payer: Self-pay | Admitting: Family Medicine

## 2018-09-30 ENCOUNTER — Encounter (HOSPITAL_COMMUNITY): Payer: Self-pay | Admitting: Emergency Medicine

## 2018-09-30 ENCOUNTER — Inpatient Hospital Stay (HOSPITAL_COMMUNITY)
Admission: EM | Admit: 2018-09-30 | Discharge: 2018-10-02 | DRG: 638 | Disposition: A | Discharge status: Home / Self Care | Payer: Self-pay | Attending: Family Medicine | Admitting: Family Medicine

## 2018-09-30 ENCOUNTER — Other Ambulatory Visit: Payer: Self-pay

## 2018-09-30 ENCOUNTER — Encounter: Payer: Self-pay | Admitting: Family Medicine

## 2018-09-30 DIAGNOSIS — K760 Fatty (change of) liver, not elsewhere classified: Secondary | ICD-10-CM | POA: Diagnosis present

## 2018-09-30 DIAGNOSIS — E1310 Other specified diabetes mellitus with ketoacidosis without coma: Secondary | ICD-10-CM

## 2018-09-30 DIAGNOSIS — K219 Gastro-esophageal reflux disease without esophagitis: Secondary | ICD-10-CM | POA: Diagnosis present

## 2018-09-30 DIAGNOSIS — R748 Abnormal levels of other serum enzymes: Secondary | ICD-10-CM

## 2018-09-30 DIAGNOSIS — E1165 Type 2 diabetes mellitus with hyperglycemia: Secondary | ICD-10-CM

## 2018-09-30 DIAGNOSIS — B379 Candidiasis, unspecified: Secondary | ICD-10-CM | POA: Diagnosis present

## 2018-09-30 DIAGNOSIS — Z79899 Other long term (current) drug therapy: Secondary | ICD-10-CM

## 2018-09-30 DIAGNOSIS — E1110 Type 2 diabetes mellitus with ketoacidosis without coma: Principal | ICD-10-CM | POA: Diagnosis present

## 2018-09-30 DIAGNOSIS — E875 Hyperkalemia: Secondary | ICD-10-CM | POA: Diagnosis present

## 2018-09-30 DIAGNOSIS — N179 Acute kidney failure, unspecified: Secondary | ICD-10-CM | POA: Diagnosis present

## 2018-09-30 DIAGNOSIS — Z20828 Contact with and (suspected) exposure to other viral communicable diseases: Secondary | ICD-10-CM | POA: Diagnosis present

## 2018-09-30 DIAGNOSIS — G43909 Migraine, unspecified, not intractable, without status migrainosus: Secondary | ICD-10-CM | POA: Diagnosis present

## 2018-09-30 DIAGNOSIS — I10 Essential (primary) hypertension: Secondary | ICD-10-CM

## 2018-09-30 DIAGNOSIS — Z881 Allergy status to other antibiotic agents status: Secondary | ICD-10-CM

## 2018-09-30 HISTORY — DX: Type 2 diabetes mellitus without complications: E11.9

## 2018-09-30 LAB — SARS CORONAVIRUS 2 BY RT PCR (HOSPITAL ORDER, PERFORMED IN ~~LOC~~ HOSPITAL LAB): SARS Coronavirus 2: NEGATIVE

## 2018-09-30 LAB — BLOOD GAS, VENOUS
Acid-base deficit: 15 mmol/L — ABNORMAL HIGH (ref 0.0–2.0)
Bicarbonate: 13.6 mmol/L — ABNORMAL LOW (ref 20.0–28.0)
Drawn by: 4437
FIO2: 21
O2 Saturation: 92.8 %
Patient temperature: 37
pCO2, Ven: 24.5 mmHg — ABNORMAL LOW (ref 44.0–60.0)
pH, Ven: 7.263 (ref 7.250–7.430)
pO2, Ven: 78.6 mmHg — ABNORMAL HIGH (ref 32.0–45.0)

## 2018-09-30 LAB — URINALYSIS, ROUTINE W REFLEX MICROSCOPIC
Bacteria, UA: NONE SEEN
Bilirubin Urine: NEGATIVE
Glucose, UA: >=500 mg/dL — AB
Hgb urine dipstick: NEGATIVE
Ketones, ur: 20 mg/dL — AB
Leukocytes,Ua: NEGATIVE
Nitrite: NEGATIVE
Protein, ur: NEGATIVE mg/dL
Specific Gravity, Urine: 1.025 (ref 1.005–1.030)
pH: 5 (ref 5.0–8.0)

## 2018-09-30 LAB — CBC WITH DIFFERENTIAL/PLATELET
Abs Immature Granulocytes: 0.1 10*3/uL — ABNORMAL HIGH (ref 0.00–0.07)
Basophils Absolute: 0.1 10*3/uL (ref 0.0–0.1)
Basophils Relative: 1 %
Eosinophils Absolute: 0 10*3/uL (ref 0.0–0.5)
Eosinophils Relative: 0 %
HCT: 55.6 % — ABNORMAL HIGH (ref 36.0–46.0)
Hemoglobin: 17.8 g/dL — ABNORMAL HIGH (ref 12.0–15.0)
Immature Granulocytes: 1 %
Lymphocytes Relative: 14 %
Lymphs Abs: 1.6 10*3/uL (ref 0.7–4.0)
MCH: 29 pg (ref 26.0–34.0)
MCHC: 32 g/dL (ref 30.0–36.0)
MCV: 90.6 fL (ref 80.0–100.0)
Monocytes Absolute: 0.6 10*3/uL (ref 0.1–1.0)
Monocytes Relative: 6 %
Neutro Abs: 8.7 10*3/uL — ABNORMAL HIGH (ref 1.7–7.7)
Neutrophils Relative %: 78 %
Platelets: 383 10*3/uL (ref 150–400)
RBC: 6.14 MIL/uL — ABNORMAL HIGH (ref 3.87–5.11)
RDW: 14.4 % (ref 11.5–15.5)
WBC: 11.1 10*3/uL — ABNORMAL HIGH (ref 4.0–10.5)
nRBC: 0 % (ref 0.0–0.2)

## 2018-09-30 LAB — COMPREHENSIVE METABOLIC PANEL
ALT: 118 U/L — ABNORMAL HIGH (ref 0–44)
AST: 45 U/L — ABNORMAL HIGH (ref 15–41)
Albumin: 5.1 g/dL — ABNORMAL HIGH (ref 3.5–5.0)
Alkaline Phosphatase: 169 U/L — ABNORMAL HIGH (ref 38–126)
Anion gap: >20 — ABNORMAL HIGH (ref 5–15)
BUN: 43 mg/dL — ABNORMAL HIGH (ref 6–20)
CO2: 12 mmol/L — ABNORMAL LOW (ref 22–32)
Calcium: 11.2 mg/dL — ABNORMAL HIGH (ref 8.9–10.3)
Chloride: 92 mmol/L — ABNORMAL LOW (ref 98–111)
Creatinine, Ser: 1.42 mg/dL — ABNORMAL HIGH (ref 0.44–1.00)
GFR calc Af Amer: 50 mL/min — ABNORMAL LOW (ref >60)
GFR calc non Af Amer: 43 mL/min — ABNORMAL LOW (ref >60)
Glucose, Bld: 942 mg/dL (ref 70–99)
Potassium: 6.6 mmol/L (ref 3.5–5.1)
Sodium: 133 mmol/L — ABNORMAL LOW (ref 135–145)
Total Bilirubin: 1.4 mg/dL — ABNORMAL HIGH (ref 0.3–1.2)
Total Protein: 9 g/dL — ABNORMAL HIGH (ref 6.5–8.1)

## 2018-09-30 LAB — BASIC METABOLIC PANEL
Anion gap: >20 — ABNORMAL HIGH (ref 5–15)
Anion gap: 12 (ref 5–15)
BUN: 35 mg/dL — ABNORMAL HIGH (ref 6–20)
BUN: 27 mg/dL — ABNORMAL HIGH (ref 6–20)
CO2: 12 mmol/L — ABNORMAL LOW (ref 22–32)
CO2: 16 mmol/L — ABNORMAL LOW (ref 22–32)
Calcium: 9.5 mg/dL (ref 8.9–10.3)
Calcium: 9.2 mg/dL (ref 8.9–10.3)
Chloride: 105 mmol/L (ref 98–111)
Chloride: 112 mmol/L — ABNORMAL HIGH (ref 98–111)
Creatinine, Ser: 1.43 mg/dL — ABNORMAL HIGH (ref 0.44–1.00)
Creatinine, Ser: 0.93 mg/dL (ref 0.44–1.00)
GFR calc Af Amer: 49 mL/min — ABNORMAL LOW (ref >60)
GFR calc Af Amer: >60 mL/min (ref >60)
GFR calc non Af Amer: 43 mL/min — ABNORMAL LOW (ref >60)
GFR calc non Af Amer: >60 mL/min (ref >60)
Glucose, Bld: 705 mg/dL (ref 70–99)
Glucose, Bld: 345 mg/dL — ABNORMAL HIGH (ref 70–99)
Potassium: 4.2 mmol/L (ref 3.5–5.1)
Potassium: 4 mmol/L (ref 3.5–5.1)
Sodium: 139 mmol/L (ref 135–145)
Sodium: 140 mmol/L (ref 135–145)

## 2018-09-30 LAB — CBG MONITORING, ED
Glucose-Capillary: >600 mg/dL (ref 70–99)
Glucose-Capillary: >600 mg/dL (ref 70–99)
Glucose-Capillary: >600 mg/dL (ref 70–99)
Glucose-Capillary: >600 mg/dL (ref 70–99)
Glucose-Capillary: 558 mg/dL (ref 70–99)
Glucose-Capillary: 445 mg/dL — ABNORMAL HIGH (ref 70–99)
Glucose-Capillary: 350 mg/dL — ABNORMAL HIGH (ref 70–99)
Glucose-Capillary: 283 mg/dL — ABNORMAL HIGH (ref 70–99)

## 2018-09-30 LAB — MAGNESIUM: Magnesium: 2.9 mg/dL — ABNORMAL HIGH (ref 1.7–2.4)

## 2018-09-30 MED ORDER — ATENOLOL 25 MG PO TABS
100.0000 mg | ORAL_TABLET | Freq: Every day | ORAL | Status: DC
  Administered 2018-09-30 – 2018-10-01 (×2): 100 mg via ORAL
  Filled 2018-09-30 (×2): qty 4

## 2018-09-30 MED ORDER — SODIUM CHLORIDE 0.9 % IV SOLN
INTRAVENOUS | Status: DC
  Administered 2018-09-30: 21:00:00 via INTRAVENOUS

## 2018-09-30 MED ORDER — ENOXAPARIN SODIUM 40 MG/0.4ML ~~LOC~~ SOLN
40.0000 mg | SUBCUTANEOUS | Status: DC
  Administered 2018-09-30 – 2018-10-01 (×2): 40 mg via SUBCUTANEOUS
  Filled 2018-09-30 (×2): qty 0.4

## 2018-09-30 MED ORDER — DEXTROSE-NACL 5-0.45 % IV SOLN
INTRAVENOUS | Status: DC
  Administered 2018-10-01: via INTRAVENOUS

## 2018-09-30 MED ORDER — CALCIUM GLUCONATE-NACL 1-0.675 GM/50ML-% IV SOLN
1.0000 g | Freq: Once | INTRAVENOUS | Status: AC
  Administered 2018-09-30: 1000 mg via INTRAVENOUS
  Filled 2018-09-30: qty 50

## 2018-09-30 MED ORDER — DEXTROSE 50 % IV SOLN: 25.0000 mL | INTRAVENOUS | Status: DC | PRN

## 2018-09-30 MED ORDER — SODIUM CHLORIDE 0.9 % IV BOLUS
500.0000 mL | Freq: Once | INTRAVENOUS | Status: AC
  Administered 2018-09-30: 500 mL via INTRAVENOUS

## 2018-09-30 MED ORDER — KETOCONAZOLE 2 % EX CREA
1.0000 "application " | TOPICAL_CREAM | Freq: Two times a day (BID) | CUTANEOUS | Status: DC
  Administered 2018-09-30 – 2018-10-01 (×3): 1 via TOPICAL
  Filled 2018-09-30 (×2): qty 15

## 2018-09-30 MED ORDER — INSULIN REGULAR BOLUS VIA INFUSION
0.0000 [IU] | Freq: Three times a day (TID) | INTRAVENOUS | Status: DC
  Filled 2018-09-30: qty 10

## 2018-09-30 MED ORDER — KETOCONAZOLE 2 % EX CREA
TOPICAL_CREAM | CUTANEOUS | Status: AC
  Filled 2018-09-30: qty 15

## 2018-09-30 MED ORDER — FLUCONAZOLE 150 MG PO TABS
150.0000 mg | ORAL_TABLET | Freq: Once | ORAL | Status: AC
  Administered 2018-10-01: 150 mg via ORAL
  Filled 2018-09-30: qty 1

## 2018-09-30 MED ORDER — IMIPRAMINE HCL 25 MG PO TABS
100.0000 mg | ORAL_TABLET | Freq: Every day | ORAL | Status: DC
  Administered 2018-09-30 – 2018-10-01 (×2): 100 mg via ORAL
  Filled 2018-09-30 (×2): qty 2
  Filled 2018-09-30: qty 4

## 2018-09-30 MED ORDER — INSULIN REGULAR(HUMAN) IN NACL 100-0.9 UT/100ML-% IV SOLN
INTRAVENOUS | Status: DC
  Administered 2018-09-30: 5.4 [IU]/h via INTRAVENOUS
  Filled 2018-09-30: qty 100

## 2018-09-30 MED ORDER — SODIUM CHLORIDE 0.9 % IV BOLUS
2000.0000 mL | Freq: Once | INTRAVENOUS | Status: AC
  Administered 2018-09-30: 2000 mL via INTRAVENOUS

## 2018-09-30 MED ORDER — SODIUM CHLORIDE 0.9 % IV SOLN: INTRAVENOUS | Status: AC

## 2018-09-30 MED ORDER — IMIPRAMINE HCL 25 MG PO TABS
ORAL_TABLET | ORAL | Status: AC
  Filled 2018-09-30: qty 4

## 2018-09-30 MED ORDER — INSULIN REGULAR(HUMAN) IN NACL 100-0.9 UT/100ML-% IV SOLN
INTRAVENOUS | Status: DC
  Administered 2018-09-30: 8.9 [IU]/h via INTRAVENOUS
  Administered 2018-09-30: 16.2 [IU]/h via INTRAVENOUS
  Filled 2018-09-30: qty 100

## 2018-09-30 MED ORDER — DEXTROSE-NACL 5-0.45 % IV SOLN: INTRAVENOUS | Status: DC

## 2018-09-30 MED ORDER — SODIUM CHLORIDE 0.9 % IV SOLN
INTRAVENOUS | Status: DC
  Administered 2018-09-30: 18:00:00 via INTRAVENOUS

## 2018-09-30 NOTE — ED Notes (Signed)
CRITICAL VALUE ALERT  Critical Value:  Potassium 6.6, Glucose 942  Date & Time Notied:  09/30/18 1636  Provider Notified: Dr. Roderic Palau  Orders Received/Actions taken: EDP notified, order for EKG

## 2018-09-30 NOTE — H&P (Signed)
History and Physical    Jeanette Pierce SLH:[id number] DOB: [birth year] DOA: 09/30/2018  PCP: Mikey Kirschner, MD   Patient coming from: Home  I have personally briefly reviewed patient's old medical records in [facility]  Chief Complaint: Weakness, Increased urination, high blood glucose  HPI: Jeanette Pierce is a 53 y.o. female with medical history significant for borderline diabetes mellitus, hypertension, presented to the ED with complaints of increased urination, lightheadedness and weakness over the past few days.  Reports vomiting in the ED.  Denies abdominal pain, no pain with urination, fever no chills, no loose stools, no difficulty breathing or cough. Patient called her primary care provider today, was told to check her blood sugar glucose-which read high.  Patient was referred to the ED.  She is not on medications for her diabetes.  ED Course: Tachycardic to 117, blood pressure 134/109.  Anion gap greater than 20, serum bicarb 12, potassium 6.6.  Blood glucose 942.  WBC 11.1.  EKG shows sinus tachycardia.  Patient started on insulin GTT.  Hospitalist admit for DKA.  Review of Systems: As per HPI all other systems reviewed and negative.  Past Medical History:  Diagnosis Date  . Diabetes mellitus without complication ([place])   . Hypertension   . Migraine headache   . Reflux     Past Surgical History:  Procedure Laterality Date  . CHOLECYSTECTOMY  before 1999  . iliocolotomy  1999  . TUBAL LIGATION  1991     reports that she has never smoked. She has never used smokeless tobacco. She reports previous alcohol use. She reports previous drug use.  Allergies  Allergen Reactions  . Erythromycin Nausea And Vomiting    Nausea, vomiting    Family History  Problem Relation Age of Onset  . Heart disease Mother   . COPD Father   . Asthma Father   . Cancer Maternal Grandfather        colon and stomach    Prior to Admission medications   Medication Sig Start Date End  Date Taking? Authorizing Provider  atenolol (TENORMIN) 100 MG tablet Take 1 tablet (100 mg total) by mouth daily. Patient taking differently: Take 100 mg by mouth at bedtime.  06/30/18  Yes Mikey Kirschner, MD  fluconazole (DIFLUCAN) 150 MG tablet Take 1 tablet (150 mg total) by mouth every other day. 09/28/18  Yes Mikey Kirschner, MD  ibuprofen (ADVIL,MOTRIN) 800 MG tablet TAKE 1 TABLET BY MOUTH EVERY 6 HOURS AS NEEDED Patient taking differently: Take 800 mg by mouth every 6 (six) hours as needed for mild pain or moderate pain.  06/04/17  Yes Luking, Elayne Snare, MD  imipramine (TOFRANIL) 25 MG tablet Take 4 tablets (100 mg total) by mouth at bedtime. Patient taking differently: Take 75 mg by mouth at bedtime.  06/30/18  Yes Mikey Kirschner, MD  ketoconazole (NIZORAL) 2 % cream Apply 1 application topically 2 (two) times daily. Affected skin 09/28/18  Yes Mikey Kirschner, MD  rizatriptan (MAXALT) 10 MG tablet Take 10 mg by mouth daily as needed (for migraine pain).    Yes [provider]    Physical Exam: Vitals:   09/30/18 1251 09/30/18 1815  BP: (!) 134/109 133/84  Pulse:  (!) 117  Resp: (!) 22   Temp: 97.9 F (36.6 C)   TempSrc: Oral   SpO2: 97% 100%  Weight: 93.9 kg   Height: 5\' 2"  (1.575 m)     Constitutional: NAD, calm, comfortable  Vitals:   09/30/18 1251 09/30/18 1815  BP: (!) 134/109 133/84  Pulse:  (!) 117  Resp: (!) 22   Temp: 97.9 F (36.6 C)   TempSrc: Oral   SpO2: 97% 100%  Weight: 93.9 kg   Height: 5\' 2"  (1.575 m)    Eyes: PERRL, lids and conjunctivae normal ENMT: Mucous membranes are dry. Posterior pharynx clear of any exudate or lesions.  Neck: normal, supple, no masses, no thyromegaly Respiratory: clear to auscultation bilaterally, no wheezing, no crackles. Normal respiratory effort. No accessory muscle use.  Cardiovascular: Regular rate and rhythm, no murmurs / rubs / gallops. No extremity edema. 2+ pedal pulses.  Abdomen: no tenderness, no masses  palpated. No hepatosplenomegaly. Bowel sounds positive.  Musculoskeletal: no clubbing / cyanosis. No joint deformity upper and lower extremities. Good ROM, no contractures. Normal muscle tone.  Skin: no rashes, lesions, ulcers. No induration Neurologic: CN 2-12 grossly intact. Sensation intact, DTR normal. Strength 5/5 in all 4.  Psychiatric: Normal judgment and insight. Alert and oriented x 3. Normal mood.   Labs on Admission: I have personally reviewed following labs and imaging studies  CBC: Recent Labs  Lab 09/30/18 1436  WBC 11.1*  NEUTROABS 8.7*  HGB 17.8*  HCT 55.6*  MCV 90.6  PLT 383   Basic Metabolic Panel: Recent Labs  Lab 09/30/18 1436  NA 133*  K 6.6*  CL 92*  CO2 12*  GLUCOSE 942*  BUN 43*  CREATININE 1.42*  CALCIUM 11.2*   Liver Function Tests: Recent Labs  Lab 09/30/18 1436  AST 45*  ALT 118*  ALKPHOS 169*  BILITOT 1.4*  PROT 9.0*  ALBUMIN 5.1*   CBG: Recent Labs  Lab 09/30/18 1250 09/30/18 1707 09/30/18 1824  GLUCAP >600* >600* >600*   Urine analysis:    Component Value Date/Time   COLORURINE STRAW (A) 09/30/2018 1256   APPEARANCEUR CLEAR 09/30/2018 1256   LABSPEC 1.025 09/30/2018 1256   PHURINE 5.0 09/30/2018 1256   GLUCOSEU >=500 (A) 09/30/2018 1256   HGBUR NEGATIVE 09/30/2018 1256   BILIRUBINUR NEGATIVE 09/30/2018 1256   KETONESUR 20 (A) 09/30/2018 1256   PROTEINUR NEGATIVE 09/30/2018 1256   NITRITE NEGATIVE 09/30/2018 1256   LEUKOCYTESUR NEGATIVE 09/30/2018 1256    Radiological Exams on Admission: No results found.  EKG: Independently reviewed.  Sinus tachycardia rate 115.  QTc 476.  Large P waves.  No old EKG to compare.  Assessment/Plan Active Problems:   DKA (diabetic ketoacidoses) (HCC)   DKA- random glucose 942, serum bicarb 12, anion gap > 20.  Possible component of HHS. Previously borderline diabetic, not on medications.  2 Liter bolus given in ED. -Continue insulin GTT, per DKA protocol - 500 ml N/s bolus -  Cont N/s 150 cc/hr  -BMP Q4h x 4 - HGbA1c - Add on venous blood gas  Acute kidney injury-creatinine 1.4, baseline ~0.7.  Likely prerenal from polyuria poor p.o. intake in the setting of DKA. -Hydrate - Follow BMP  Hyperkalemia- potassium 6.6.  Likely secondary to hyperglycemia, and acute kidney injury.  EKG without concerning abnormalities (no old EKG to compare). -Monitor K closely -Calcium gluconate x1  Elevated liver enzymes-AST 45, ALT 118, ALP 119.  Liver enzymes more elevated than prior. Benign abdominal exam. -Hepatitis panel -Right upper quadrant ultrasound -Liver panel a.m.  Hypertension-stable. -Continue home atenolol  Migraine headaches-stable. -Continue home atenolol, imipramine.  Yeast infection-recent diagnosis. Completing 3 dose course of Diflucan and on topical ketonazole. - Resume home meds  HIV and SARS-CoV-2 test  pending.   DVT prophylaxis: Lovenox Code Status: Full Family Communication: None at bedside Disposition Plan: Per rounding team Consults called: None Admission status: Inpt, Step down I certify that at the point of admission it is my clinical judgment that the patient will require inpatient hospital care spanning beyond 2 midnights from the point of admission due to high intensity of service, high risk for further deterioration and high frequency of surveillance required. The following factors support the patient status of inpatient: DKA requiring insulin drip and hence stepdown level of care.   Onnie BoerEjiroghene E Rashiya Lofland MD [facility]  09/30/2018, 7:14 PM

## 2018-09-30 NOTE — Progress Notes (Signed)
   Subjective:    Patient ID: Jeanette Pierce, female    DOB: [birth year], 53 y.o.   MRN: [id number]  HPI Pt states she is extremely dehydrated. Pt states that this has been going on for a couple days; has to urinate every 5 minutes; has a metal taste in mouth; barely able to keep food/drink down; decreased appetite.  Virtual Visit via Video Note  I connected with [place] on 09/30/18 at 11:30 AM EDT by a video enabled telemedicine application and verified that I am speaking with the correct person using two identifiers.  Location: Patient: home Provider: office   I discussed the limitations of evaluation and management by telemedicine and the availability of in person appointments. The patient expressed understanding and agreed to proceed.  History of Present Illness:    Observations/Objective:   Assessment and Plan:   Follow Up Instructions:    I discussed the assessment and treatment plan with the patient. The patient was provided an opportunity to ask questions and all were answered. The patient agreed with the plan and demonstrated an understanding of the instructions.   The patient was advised to call back or seek an in-person evaluation if the symptoms worsen or if the condition fails to improve as anticipated.  I provided 18 minutes of non-face-to-face time during this encounter.   Vicente Males, LPN  Diminished energy.  Frequent urination.  Weakness.  Lightheaded.  Feeling dehydrated.  Has not been checking her sugars  Review of Systems No chest pain no abdominal pain no change in bowel habits    Objective:   Physical Exam  Virtual      Assessment & Plan:  Impression polyuria with severe weakness and lightheadedness.  I asked patient to check her glucose which she did while we were on the phone.  Her glucose was too high to register.  She was directed to go immediately to the emergency room.  We called and spoke with the ER physician

## 2018-09-30 NOTE — ED Triage Notes (Signed)
Pt states that her monitor states that her sugar was high. She was sent by dr Karin Golden. She has been vomiting this has been going on for a couple of days.

## 2018-10-01 ENCOUNTER — Encounter (HOSPITAL_COMMUNITY): Payer: Self-pay

## 2018-10-01 ENCOUNTER — Inpatient Hospital Stay (HOSPITAL_COMMUNITY): Payer: Self-pay

## 2018-10-01 DIAGNOSIS — I10 Essential (primary) hypertension: Secondary | ICD-10-CM

## 2018-10-01 DIAGNOSIS — E1110 Type 2 diabetes mellitus with ketoacidosis without coma: Principal | ICD-10-CM

## 2018-10-01 LAB — CBG MONITORING, ED
Glucose-Capillary: 146 mg/dL — ABNORMAL HIGH (ref 70–99)
Glucose-Capillary: 163 mg/dL — ABNORMAL HIGH (ref 70–99)
Glucose-Capillary: 165 mg/dL — ABNORMAL HIGH (ref 70–99)
Glucose-Capillary: 166 mg/dL — ABNORMAL HIGH (ref 70–99)
Glucose-Capillary: 167 mg/dL — ABNORMAL HIGH (ref 70–99)
Glucose-Capillary: 169 mg/dL — ABNORMAL HIGH (ref 70–99)
Glucose-Capillary: 199 mg/dL — ABNORMAL HIGH (ref 70–99)
Glucose-Capillary: 200 mg/dL — ABNORMAL HIGH (ref 70–99)
Glucose-Capillary: 222 mg/dL — ABNORMAL HIGH (ref 70–99)

## 2018-10-01 LAB — BASIC METABOLIC PANEL
Anion gap: 7 (ref 5–15)
Anion gap: 6 (ref 5–15)
BUN: 24 mg/dL — ABNORMAL HIGH (ref 6–20)
BUN: 23 mg/dL — ABNORMAL HIGH (ref 6–20)
CO2: 22 mmol/L (ref 22–32)
CO2: 23 mmol/L (ref 22–32)
Calcium: 9.2 mg/dL (ref 8.9–10.3)
Calcium: 8.8 mg/dL — ABNORMAL LOW (ref 8.9–10.3)
Chloride: 114 mmol/L — ABNORMAL HIGH (ref 98–111)
Chloride: 112 mmol/L — ABNORMAL HIGH (ref 98–111)
Creatinine, Ser: 0.78 mg/dL (ref 0.44–1.00)
Creatinine, Ser: 0.8 mg/dL (ref 0.44–1.00)
GFR calc Af Amer: >60 mL/min (ref >60)
GFR calc Af Amer: >60 mL/min (ref >60)
GFR calc non Af Amer: >60 mL/min (ref >60)
GFR calc non Af Amer: >60 mL/min (ref >60)
Glucose, Bld: 175 mg/dL — ABNORMAL HIGH (ref 70–99)
Glucose, Bld: 147 mg/dL — ABNORMAL HIGH (ref 70–99)
Potassium: 4.1 mmol/L (ref 3.5–5.1)
Potassium: 3.8 mmol/L (ref 3.5–5.1)
Sodium: 143 mmol/L (ref 135–145)
Sodium: 141 mmol/L (ref 135–145)

## 2018-10-01 LAB — HEPATIC FUNCTION PANEL
ALT: 79 U/L — ABNORMAL HIGH (ref 0–44)
AST: 30 U/L (ref 15–41)
Albumin: 3.8 g/dL (ref 3.5–5.0)
Alkaline Phosphatase: 112 U/L (ref 38–126)
Bilirubin, Direct: 0.1 mg/dL (ref 0.0–0.2)
Indirect Bilirubin: 0.6 mg/dL (ref 0.3–0.9)
Total Bilirubin: 0.7 mg/dL (ref 0.3–1.2)
Total Protein: 6.6 g/dL (ref 6.5–8.1)

## 2018-10-01 LAB — GLUCOSE, CAPILLARY
Glucose-Capillary: 226 mg/dL — ABNORMAL HIGH (ref 70–99)
Glucose-Capillary: 169 mg/dL — ABNORMAL HIGH (ref 70–99)

## 2018-10-01 MED ORDER — INSULIN ASPART 100 UNIT/ML ~~LOC~~ SOLN
18.0000 [IU] | Freq: Three times a day (TID) | SUBCUTANEOUS | Status: DC
  Administered 2018-10-01: 18 [IU] via SUBCUTANEOUS

## 2018-10-01 MED ORDER — INSULIN ASPART 100 UNIT/ML ~~LOC~~ SOLN
15.0000 [IU] | Freq: Three times a day (TID) | SUBCUTANEOUS | Status: DC
  Administered 2018-10-01 (×2): 15 [IU] via SUBCUTANEOUS
  Filled 2018-10-01: qty 1

## 2018-10-01 MED ORDER — INSULIN ASPART 100 UNIT/ML ~~LOC~~ SOLN
0.0000 [IU] | Freq: Three times a day (TID) | SUBCUTANEOUS | Status: DC
  Administered 2018-10-01: 4 [IU] via SUBCUTANEOUS
  Administered 2018-10-01: 7 [IU] via SUBCUTANEOUS
  Administered 2018-10-02 (×2): 11 [IU] via SUBCUTANEOUS

## 2018-10-01 MED ORDER — LIVING WELL WITH DIABETES BOOK
Freq: Once | Status: DC
  Filled 2018-10-01: qty 1

## 2018-10-01 MED ORDER — INSULIN GLARGINE 100 UNIT/ML ~~LOC~~ SOLN
70.0000 [IU] | SUBCUTANEOUS | Status: DC
  Administered 2018-10-01 – 2018-10-02 (×2): 70 [IU] via SUBCUTANEOUS
  Filled 2018-10-01 (×6): qty 0.7

## 2018-10-01 NOTE — Progress Notes (Addendum)
PROGRESS NOTE    Jeanette Pierce  XLK:[id number]  DOB: 1965-07-28  DOA: 09/30/2018 PCP: Merlyn AlbertLuking, William S, MD   Brief Admission Hx: 53 year old female with hypertension presented with severe symptomatic diabetic ketoacidosis  MDM/Assessment & Plan:   1. Diabetic ketoacidosis-patient has responded well to IV fluid hydration and the IV insulin infusion.  Her anion gap is corrected and she is being transitioned to subcutaneous insulin and taken off the IV insulin infusion.  Her insulin drip is running at about 5 to 6 units/h.  She is being transitioned to Lantus 70 units once daily followed by NovoLog 15 units 3 times daily AC plus resistant sliding scale coverage.  Check blood glucose 5 times per day after the insulin infusion has been discontinued.  Strict instructions were given for the insulin infusion to continue until 2 hours after Lantus has been given.  Consult to the diabetes education and coordination staff.  The patient would prefer insulin pens and is motivated to start teaching.  Hemoglobin A1c unfortunately is still pending at this time.  Added C-peptide and GAD 65 antibody testing. 2. Hyperkalemia-resolved this was secondary to acidosis associated with DKA.  Monitor BMP. 3. Elevated LFTs status hepatitis panel pending however ultrasound of the abdomen suggesting hepatic steatosis which is likely the cause. 4. Essential hypertension-patient has been resumed on her home atenolol. 5. Chronic migraine headaches- resumed on home imipramine and atenolol. 6. Yeast infections-secondary to poorly controlled diabetes mellitus-she has been treated with fluconazole and topical ketoconazole.  DVT prophylaxis: Lovenox Code Status: Full Family Communication: Patient updated at bedside Disposition Plan: Inpatient MedSurg for further treatments   Consultants:  Diabetes coordinator staff  Procedures:  N/A  Antimicrobials:  N/A  Subjective: Patient without specific complaints.  She is  tired today.  She is able to eat and drink.  Objective: Vitals:   10/01/18 0400 10/01/18 0430 10/01/18 0500 10/01/18 0630  BP: 102/75 107/75 104/73 105/73  Pulse: 67 67 64 63  Resp:      Temp:      TempSrc:      SpO2: 95% 97% 95% 100%  Weight:      Height:        Intake/Output Summary (Last 24 hours) at 10/01/2018 1231 Last data filed at 10/01/2018 1230 Gross per 24 hour  Intake 3857.9 ml  Output --  Net 3857.9 ml   Filed Weights   09/30/18 1251  Weight: 93.9 kg     REVIEW OF SYSTEMS  As per history otherwise all reviewed and reported negative  Exam:  General exam: Awake alert in no distress cooperative and pleasant. Respiratory system: Clear. No increased work of breathing. Cardiovascular system: S1 & S2 heard. No JVD, murmurs, gallops, clicks or pedal edema. Gastrointestinal system: Abdomen is nondistended, soft and nontender. Normal bowel sounds heard. Central nervous system: Alert and oriented. No focal neurological deficits. Extremities: no CCE.  Data Reviewed: Basic Metabolic Panel: Recent Labs  Lab 09/30/18 1436 09/30/18 1918 09/30/18 2229 10/01/18 0230 10/01/18 0631  NA 133* 139 140 143 141  K 6.6* 4.2 4.0 4.1 3.8  CL 92* 105 112* 114* 112*  CO2 12* 12* 16* 22 23  GLUCOSE 942* 705* 345* 175* 147*  BUN 43* 35* 27* 24* 23*  CREATININE 1.42* 1.43* 0.93 0.78 0.80  CALCIUM 11.2* 9.5 9.2 9.2 8.8*  MG  --  2.9*  --   --   --    Liver Function Tests: Recent Labs  Lab 09/30/18 1436 10/01/18 0230  AST 45* 30  ALT 118* 79*  ALKPHOS 169* 112  BILITOT 1.4* 0.7  PROT 9.0* 6.6  ALBUMIN 5.1* 3.8   No results for input(s): LIPASE, AMYLASE in the last 168 hours. No results for input(s): AMMONIA in the last 168 hours. CBC: Recent Labs  Lab 09/30/18 1436  WBC 11.1*  NEUTROABS 8.7*  HGB 17.8*  HCT 55.6*  MCV 90.6  PLT 383   Cardiac Enzymes: No results for input(s): CKTOTAL, CKMB, CKMBINDEX, TROPONINI in the last 168 hours. CBG (last 3)  Recent  Labs    10/01/18 0617 10/01/18 0757 10/01/18 0927  GLUCAP 146* 169* 163*   Recent Results (from the past 240 hour(s))  SARS Coronavirus 2 [facility] order, Performed in [facility] hospital lab) Nasopharyngeal Nasopharyngeal Swab     Status: None   Collection Time: 09/30/18  4:52 PM   Specimen: Nasopharyngeal Swab  Result Value Ref Range Status   SARS Coronavirus 2 NEGATIVE NEGATIVE Final    Comment: (NOTE) If result is NEGATIVE SARS-CoV-2 target nucleic acids are NOT DETECTED. The SARS-CoV-2 RNA is generally detectable in upper and lower  respiratory specimens during the acute phase of infection. The lowest  concentration of SARS-CoV-2 viral copies this assay can detect is 250  copies / mL. A negative result does not preclude SARS-CoV-2 infection  and should not be used as the sole basis for treatment or other  patient management decisions.  A negative result may occur with  improper specimen collection / handling, submission of specimen other  than nasopharyngeal swab, presence of viral mutation(s) within the  areas targeted by this assay, and inadequate number of viral copies  (<250 copies / mL). A negative result must be combined with clinical  observations, patient history, and epidemiological information. If result is POSITIVE SARS-CoV-2 target nucleic acids are DETECTED. The SARS-CoV-2 RNA is generally detectable in upper and lower  respiratory specimens dur ing the acute phase of infection.  Positive  results are indicative of active infection with SARS-CoV-2.  Clinical  correlation with patient history and other diagnostic information is  necessary to determine patient infection status.  Positive results do  not rule out bacterial infection or co-infection with other viruses. If result is PRESUMPTIVE POSTIVE SARS-CoV-2 nucleic acids MAY BE PRESENT.   A presumptive positive result was obtained on the submitted specimen  and confirmed on repeat testing.  While 2019 novel  coronavirus  (SARS-CoV-2) nucleic acids may be present in the submitted sample  additional confirmatory testing may be necessary for epidemiological  and / or clinical management purposes  to differentiate between  SARS-CoV-2 and other Sarbecovirus currently known to infect humans.  If clinically indicated additional testing with an alternate test  methodology [phone]) is advised. The SARS-CoV-2 RNA is generally  detectable in upper and lower respiratory sp ecimens during the acute  phase of infection. The expected result is Negative. Fact Sheet for Patients:  StrictlyIdeas.no Fact Sheet for Healthcare Providers: BankingDealers.co.za This test is not yet approved or cleared by the [place] FDA and has been authorized for detection and/or diagnosis of SARS-CoV-2 by FDA under an Emergency Use Authorization (EUA).  This EUA will remain in effect (meaning this test can be used) for the duration of the COVID-19 declaration under Section 564(b)(1) of the Act, 21 U.S.C. section 360bbb-3(b)(1), unless the authorization is terminated or revoked sooner. Performed at [facility], [address], [place], [place] [address]      Studies: US Abdomen Limited Ruq  Result  Date: 10/01/2018 CLINICAL DATA:  Elevated liver enzymes EXAM: ULTRASOUND ABDOMEN LIMITED RIGHT UPPER QUADRANT COMPARISON:  Report for CT abdomen/pelvis 12/14/1998 (images unavailable). FINDINGS: Gallbladder: Patient reportedly status post cholecystectomy. Common bile duct: Diameter: 0.5 cm Liver: No focal lesion identified. Diffusely increased echogenicity of the liver parenchyma. The interrogated main portal vein is patent on color Doppler imaging with normal direction of blood flow toward the liver. Other: Incidentally noted 4.5 x 5 x 5.1 cm cyst arising from the upper pole of the right kidney. IMPRESSION: Diffusely increased echogenicity of the liver parenchyma. Findings may be seen  in the setting of steatosis or fibrosis. Consider ultrasound elastography for further evaluation. Incidentally noted 5.1 cm right upper pole renal cyst. Electronically Signed   By: Jackey LogeKyle  Golden   On: 10/01/2018 09:09     Scheduled Meds:  atenolol  100 mg Oral QHS   enoxaparin (LOVENOX) injection  40 mg Subcutaneous Q24H   [START ON 10/02/2018] fluconazole  150 mg Oral Once   imipramine  100 mg Oral QHS   insulin aspart  0-20 Units Subcutaneous TID WC   insulin aspart  15 Units Subcutaneous TID WC   insulin glargine  70 Units Subcutaneous Q24H   ketoconazole  1 application Topical BID   living well with diabetes book   Does not apply Once   Continuous Infusions:  dextrose 5 % and 0.45% NaCl Stopped (10/01/18 1230)   insulin Stopped (10/01/18 1216)    Active Problems:   DKA (diabetic ketoacidoses) (HCC)   Time spent:   Standley Dakinslanford Ashleynicole Mcclees, MD [facility] 10/01/2018, 12:31 PM    LOS: 1 day  How to contact the [facility] Attending or Consulting provider 7A - 7P or covering provider during after hours 7P -7A, for this patient?  1. Check the care team in [facility] and look for a) attending/consulting [facility] provider listed and b) the [facility] team listed 2. Log into [email or web address] and use [place]'s universal password to access. If you do not have the password, please contact the hospital operator. 3. Locate the [facility] provider you are looking for under [facility] and page to a number that you can be directly reached. 4. If you still have difficulty reaching the provider, please page the [facility] (Director on Call) for the Hospitalists listed on amion for assistance.

## 2018-10-01 NOTE — Progress Notes (Signed)
Inpatient Diabetes Program Recommendations  AACE/ADA: New Consensus Statement on Inpatient Glycemic Control (2015)  Target Ranges:  Prepandial:   less than 140 mg/dL      Peak postprandial:   less than 180 mg/dL (1-2 hours)      Critically ill patients:  140 - 180 mg/dL   Lab Results  Component Value Date   GLUCAP 163 (H) 10/01/2018   HGBA1C 6.2 (A) 12/29/2017    Review of Glycemic Control  Diabetes history: Borderline DM Outpatient Diabetes medications: None Current orders for Inpatient glycemic control: Lantus 70 units + Novolog 15 units tid + Novolog resistant tid  Inpatient Diabetes Program Recommendations:   Noted patient is new onset diabetes. Will order resources and speak with patient after admitted.  Thank you, Nani Gasser. Junelle Hashemi, RN, MSN, CDE  Diabetes Coordinator Inpatient Glycemic Control Team Team Pager [phone] (8am-5pm) 10/01/2018 11:24 AM

## 2018-10-01 NOTE — ED Notes (Signed)
Spoke with Dr. Olevia Bowens, labs reported, verbal order to decrease insulin drip to 4.5

## 2018-10-02 ENCOUNTER — Telehealth: Payer: Self-pay | Admitting: Family Medicine

## 2018-10-02 DIAGNOSIS — I10 Essential (primary) hypertension: Secondary | ICD-10-CM

## 2018-10-02 DIAGNOSIS — K219 Gastro-esophageal reflux disease without esophagitis: Secondary | ICD-10-CM

## 2018-10-02 LAB — BASIC METABOLIC PANEL
Anion gap: 7 (ref 5–15)
BUN: 17 mg/dL (ref 6–20)
CO2: 22 mmol/L (ref 22–32)
Calcium: 8.4 mg/dL — ABNORMAL LOW (ref 8.9–10.3)
Chloride: 107 mmol/L (ref 98–111)
Creatinine, Ser: 0.78 mg/dL (ref 0.44–1.00)
GFR calc Af Amer: >60 mL/min (ref >60)
GFR calc non Af Amer: >60 mL/min (ref >60)
Glucose, Bld: 264 mg/dL — ABNORMAL HIGH (ref 70–99)
Potassium: 3.9 mmol/L (ref 3.5–5.1)
Sodium: 136 mmol/L (ref 135–145)

## 2018-10-02 LAB — GLUCOSE, CAPILLARY
Glucose-Capillary: 260 mg/dL — ABNORMAL HIGH (ref 70–99)
Glucose-Capillary: 257 mg/dL — ABNORMAL HIGH (ref 70–99)
Glucose-Capillary: 291 mg/dL — ABNORMAL HIGH (ref 70–99)

## 2018-10-02 LAB — MAGNESIUM: Magnesium: 1.9 mg/dL (ref 1.7–2.4)

## 2018-10-02 LAB — HEPATITIS PANEL, ACUTE
HCV Ab: <0.1 s/co ratio (ref 0.0–0.9)
Hep A IgM: NEGATIVE
Hep B C IgM: NEGATIVE
Hepatitis B Surface Ag: NEGATIVE

## 2018-10-02 LAB — HIV ANTIBODY (ROUTINE TESTING W REFLEX): HIV Screen 4th Generation wRfx: NONREACTIVE

## 2018-10-02 LAB — C-PEPTIDE: C-Peptide: 4.6 ng/mL — ABNORMAL HIGH (ref 1.1–4.4)

## 2018-10-02 LAB — HEMOGLOBIN A1C
Hgb A1c MFr Bld: >15.5 % — ABNORMAL HIGH (ref 4.8–5.6)
Mean Plasma Glucose: >398 mg/dL

## 2018-10-02 MED ORDER — LIVING WELL WITH DIABETES BOOK
Freq: Once | Status: AC
  Administered 2018-10-02: 13:00:00

## 2018-10-02 MED ORDER — BLOOD GLUCOSE METER KIT: PACK | 0 refills | Status: DC

## 2018-10-02 MED ORDER — NOVOLOG FLEXPEN 100 UNIT/ML ~~LOC~~ SOPN: 20.0000 [IU] | PEN_INJECTOR | Freq: Three times a day (TID) | SUBCUTANEOUS | 1 refills | Status: DC

## 2018-10-02 MED ORDER — INSULIN ASPART 100 UNIT/ML ~~LOC~~ SOLN
20.0000 [IU] | Freq: Three times a day (TID) | SUBCUTANEOUS | Status: DC
  Administered 2018-10-02 (×2): 20 [IU] via SUBCUTANEOUS

## 2018-10-02 MED ORDER — INSULIN STARTER KIT- PEN NEEDLES (ENGLISH)
1.0000 | Freq: Once | Status: DC
  Filled 2018-10-02: qty 1

## 2018-10-02 MED ORDER — METFORMIN HCL ER 500 MG PO TB24
500.0000 mg | ORAL_TABLET | Freq: Every day | ORAL | Status: DC
  Administered 2018-10-02: 500 mg via ORAL
  Filled 2018-10-02: qty 1

## 2018-10-02 MED ORDER — IMIPRAMINE HCL 25 MG PO TABS: 75.0000 mg | ORAL_TABLET | Freq: Every day | ORAL | Status: DC

## 2018-10-02 MED ORDER — LANTUS SOLOSTAR 100 UNIT/ML ~~LOC~~ SOPN: 60.0000 [IU] | PEN_INJECTOR | Freq: Every day | SUBCUTANEOUS | 1 refills | Status: DC

## 2018-10-02 MED ORDER — INSULIN PEN NEEDLE 31G X 5 MM MISC: 1.0000 | 1 refills | Status: DC

## 2018-10-02 MED ORDER — METFORMIN HCL ER 500 MG PO TB24: 500.0000 mg | ORAL_TABLET | Freq: Two times a day (BID) | ORAL | 1 refills | Status: DC

## 2018-10-02 MED ORDER — PANTOPRAZOLE SODIUM 40 MG PO TBEC
40.0000 mg | DELAYED_RELEASE_TABLET | Freq: Every day | ORAL | Status: DC
  Administered 2018-10-02: 40 mg via ORAL
  Filled 2018-10-02: qty 1

## 2018-10-02 NOTE — Telephone Encounter (Signed)
Forestine Na called and made this patient a hospital f/u appt for next week for diabetes.  Does this need to be a virtual or in office (with prescreening)?

## 2018-10-02 NOTE — Discharge Summary (Signed)
Physician Discharge Summary  Jeanette Pierce:[id number] DOB: 1965/06/26 DOA: 09/30/2018  PCP: Jeanette Kirschner, MD  Admit date: 09/30/2018 Discharge date: 10/02/2018  Admitted From: Home  Disposition: Home   Recommendations for Outpatient Follow-up:  1. Follow up with PCP in 1 weeks 2. Establish care with diabetologist in 2 weeks 3. Please obtain BMP/CBC in 1-2 weeks 4. Please follow up on the following pending results: c peptide / GAD 65 Abs  Discharge Condition: STABLE   CODE STATUS: FULL    Brief Hospitalization Summary: Please see all hospital notes, images, labs for full details of the hospitalization. Dr. Talmadge Pierce HPI: Jeanette Pierce is a 53 y.o. female with medical history significant for borderline diabetes mellitus, hypertension, presented to the ED with complaints of increased urination, lightheadedness and weakness over the past few days.  Reports vomiting in the ED.  Denies abdominal pain, no pain with urination, fever no chills, no loose stools, no difficulty breathing or cough. Patient called her primary care provider today, was told to check her blood sugar glucose-which read high.  Patient was referred to the ED.  She is not on medications for her diabetes.  ED Course: Tachycardic to 117, blood pressure 134/109.  Anion gap greater than 20, serum bicarb 12, potassium 6.6.  Blood glucose 942.  WBC 11.1.  EKG shows sinus tachycardia.  Patient started on insulin GTT.  Hospitalist admit for DKA.  Brief Admission Hx: 53 year old female with hypertension presented with severe symptomatic diabetic ketoacidosis  MDM/Assessment & Plan:   1. Diabetic ketoacidosis-RESOLVED now.  Patient has responded well to IV fluid hydration and the IV insulin infusion.  Her anion gap is corrected and she was transitioned to subcutaneous insulin and taken off the IV insulin infusion.  Her insulin drip was running at about 5 to 6 units/h.  She is being transitioned to Lantus 60 units once daily  followed by NovoLog 20 units 3 times daily AC plus resistant sliding scale coverage.  Check blood glucose 5 times per day after the insulin infusion has been discontinued.  Strict instructions were given for the insulin infusion to continue until 2 hours after Lantus has been given.  Consult to the diabetes education and coordination staff.  The patient would prefer insulin pens and is motivated to start teaching.  Hemoglobin A1c was >15.5.    C-peptide and GAD 65 antibody testing still pending.  Outpatient PCP and endocrinology recommended.  Pt prefers to have insulin pens.  Rxs given at discharge with rx for glucose meter and testing supplies.  2. Hyperkalemia-resolved this was secondary to acidosis associated with DKA.  Monitor BMP. 3. Elevated LFTs status hepatitis panel pending however ultrasound of the abdomen suggesting hepatic steatosis which is likely the cause. 4. Essential hypertension-patient has been resumed on her home atenolol. 5. Chronic migraine headaches- resumed on home imipramine and atenolol. 6. Yeast infections-secondary to poorly controlled diabetes mellitus-she has been treated with fluconazole and topical ketoconazole.  DVT prophylaxis: Lovenox Code Status: Full Family Communication: Patient updated at bedside Disposition Plan: Home   Consultants:  Diabetes coordinator staff  Procedures:  N/A  Antimicrobials:  N/A  Discharge Diagnoses:  Active Problems:   Esophageal reflux   DKA (diabetic ketoacidoses) ([place])   Hypertension   Discharge Instructions: Discharge Instructions    Call MD for:  extreme fatigue   Complete by: As directed    Call MD for:  persistant dizziness or light-headedness   Complete by: As directed    Call MD  for:  persistant nausea and vomiting   Complete by: As directed    Increase activity slowly   Complete by: As directed      Allergies as of 10/02/2018      Reactions   Erythromycin Nausea And Vomiting   Nausea, vomiting       Medication List    STOP taking these medications   fluconazole 150 MG tablet Commonly known as: DIFLUCAN     TAKE these medications   atenolol 100 MG tablet Commonly known as: TENORMIN Take 1 tablet (100 mg total) by mouth daily. What changed: when to take this   blood glucose meter kit and supplies Dispense based on patient and insurance preference. Use up to four times daily as directed. (FOR ICD-10 E10.9, E11.9).   ibuprofen 800 MG tablet Commonly known as: ADVIL TAKE 1 TABLET BY MOUTH EVERY 6 HOURS AS NEEDED What changed: reasons to take this   imipramine 25 MG tablet Commonly known as: TOFRANIL Take 3 tablets (75 mg total) by mouth at bedtime.   Insulin Pen Needle 31G X 5 MM Misc 1 Device by Does not apply route as directed.   ketoconazole 2 % cream Commonly known as: NIZORAL Apply 1 application topically 2 (two) times daily. Affected skin   Lantus SoloStar 100 UNIT/ML Solostar Pen Generic drug: Insulin Glargine Inject 60 Units into the skin daily.   Maxalt 10 MG tablet Generic drug: rizatriptan Take 10 mg by mouth daily as needed (for migraine pain).   metFORMIN 500 MG 24 hr tablet Commonly known as: Glucophage XR Take 1 tablet (500 mg total) by mouth 2 (two) times daily with a meal.   NovoLOG FlexPen 100 UNIT/ML FlexPen Generic drug: insulin aspart Inject 20 Units into the skin 3 (three) times daily with meals.      Follow-up Information    Jeanette Kirschner, MD Follow up on 10/08/2018.   Specialty: Family Medicine Why: Thursday at [place] with Dr Jeanette Pierce. The office will call to let you know if this is virtual or in person. Contact information: [place] [address] [phone]        Jeanette Anger, MD. Schedule an appointment as soon as possible for a visit in 2 week(s).   Specialty: Endocrinology Why: Establish Care for diabetes mellitus Contact information: [place]  [address] [phone]          Allergies  Allergen Reactions  . Erythromycin Nausea And Vomiting    Nausea, vomiting   Allergies as of 10/02/2018      Reactions   Erythromycin Nausea And Vomiting   Nausea, vomiting      Medication List    STOP taking these medications   fluconazole 150 MG tablet Commonly known as: DIFLUCAN     TAKE these medications   atenolol 100 MG tablet Commonly known as: TENORMIN Take 1 tablet (100 mg total) by mouth daily. What changed: when to take this   blood glucose meter kit and supplies Dispense based on patient and insurance preference. Use up to four times daily as directed. (FOR ICD-10 E10.9, E11.9).   ibuprofen 800 MG tablet Commonly known as: ADVIL TAKE 1 TABLET BY MOUTH EVERY 6 HOURS AS NEEDED What changed: reasons to take this   imipramine 25 MG tablet Commonly known as: TOFRANIL Take 3 tablets (75 mg total) by mouth at bedtime.   Insulin Pen Needle 31G X 5 MM Misc 1 Device by Does not apply  route as directed.   ketoconazole 2 % cream Commonly known as: NIZORAL Apply 1 application topically 2 (two) times daily. Affected skin   Lantus SoloStar 100 UNIT/ML Solostar Pen Generic drug: Insulin Glargine Inject 60 Units into the skin daily.   Maxalt 10 MG tablet Generic drug: rizatriptan Take 10 mg by mouth daily as needed (for migraine pain).   metFORMIN 500 MG 24 hr tablet Commonly known as: Glucophage XR Take 1 tablet (500 mg total) by mouth 2 (two) times daily with a meal.   NovoLOG FlexPen 100 UNIT/ML FlexPen Generic drug: insulin aspart Inject 20 Units into the skin 3 (three) times daily with meals.       Procedures/Studies: US Abdomen Limited Ruq  Result Date: 10/01/2018 CLINICAL DATA:  Elevated liver enzymes EXAM: ULTRASOUND ABDOMEN LIMITED RIGHT UPPER QUADRANT COMPARISON:  Report for CT abdomen/pelvis 12/14/1998 (images unavailable). FINDINGS: Gallbladder: Patient reportedly status post cholecystectomy. Common  bile duct: Diameter: 0.5 cm Liver: No focal lesion identified. Diffusely increased echogenicity of the liver parenchyma. The interrogated main portal vein is patent on color Doppler imaging with normal direction of blood flow toward the liver. Other: Incidentally noted 4.5 x 5 x 5.1 cm cyst arising from the upper pole of the right kidney. IMPRESSION: Diffusely increased echogenicity of the liver parenchyma. Findings may be seen in the setting of steatosis or fibrosis. Consider ultrasound elastography for further evaluation. Incidentally noted 5.1 cm right upper pole renal cyst. Electronically Signed   By: Kellie Simmering   On: 10/01/2018 09:09      Subjective: Pt reports feeling better and motivated to care for diabetes and worked closely with diabetes educator.   Discharge Exam: Vitals:   10/02/18 0000 10/02/18 0400  BP: 112/80 105/70  Pulse: 64 (!) 59  Resp: 18 16  Temp: 98.2 F (36.8 C) 98.1 F (36.7 C)  SpO2: 100% 98%   Vitals:   10/01/18 2008 10/01/18 2232 10/02/18 0000 10/02/18 0400  BP: 111/71 119/82 112/80 105/70  Pulse: 71 77 64 (!) 59  Resp: _0 Temp: 98.5 F (36.9 C)  98.2 F (36.8 C) 98.1 F (36.7 C)  TempSrc: Oral  Oral Oral  SpO2: 98%  100% 98%  Weight:      Height:        General: Pt is alert, awake, not in acute distress Cardiovascular: RRR, S1/S2 +, no rubs, no gallops Respiratory: CTA bilaterally, no wheezing, no rhonchi Abdominal: Soft, NT, ND, bowel sounds + Extremities: no edema, no cyanosis   The results of significant diagnostics from this hospitalization (including imaging, microbiology, ancillary and laboratory) are listed below for reference.     Microbiology: Recent Results (from the past 240 hour(s))  SARS Coronavirus 2 [facility] order, Performed in [facility] hospital lab) Nasopharyngeal Nasopharyngeal Swab     Status: None   Collection Time: 09/30/18  4:52 PM   Specimen: Nasopharyngeal Swab  Result Value Ref Range Status   SARS  Coronavirus 2 NEGATIVE NEGATIVE Final    Comment: (NOTE) If result is NEGATIVE SARS-CoV-2 target nucleic acids are NOT DETECTED. The SARS-CoV-2 RNA is generally detectable in upper and lower  respiratory specimens during the acute phase of infection. The lowest  concentration of SARS-CoV-2 viral copies this assay can detect is 250  copies / mL. A negative result does not preclude SARS-CoV-2 infection  and should not be used as the sole basis for treatment or other  patient management decisions.  A negative result may occur with  improper specimen collection / handling, submission of specimen other  than nasopharyngeal swab, presence of viral mutation(s) within the  areas targeted by this assay, and inadequate number of viral copies  (<250 copies / mL). A negative result must be combined with clinical  observations, patient history, and epidemiological information. If result is POSITIVE SARS-CoV-2 target nucleic acids are DETECTED. The SARS-CoV-2 RNA is generally detectable in upper and lower  respiratory specimens dur ing the acute phase of infection.  Positive  results are indicative of active infection with SARS-CoV-2.  Clinical  correlation with patient history and other diagnostic information is  necessary to determine patient infection status.  Positive results do  not rule out bacterial infection or co-infection with other viruses. If result is PRESUMPTIVE POSTIVE SARS-CoV-2 nucleic acids MAY BE PRESENT.   A presumptive positive result was obtained on the submitted specimen  and confirmed on repeat testing.  While 2019 novel coronavirus  (SARS-CoV-2) nucleic acids may be present in the submitted sample  additional confirmatory testing may be necessary for epidemiological  and / or clinical management purposes  to differentiate between  SARS-CoV-2 and other Sarbecovirus currently known to infect humans.  If clinically indicated additional testing with an alternate test   methodology [phone]) is advised. The SARS-CoV-2 RNA is generally  detectable in upper and lower respiratory sp ecimens during the acute  phase of infection. The expected result is Negative. Fact Sheet for Patients:  StrictlyIdeas.no Fact Sheet for Healthcare Providers: BankingDealers.co.za This test is not yet approved or cleared by the [place] FDA and has been authorized for detection and/or diagnosis of SARS-CoV-2 by FDA under an Emergency Use Authorization (EUA).  This EUA will remain in effect (meaning this test can be used) for the duration of the COVID-19 declaration under Section 564(b)(1) of the Act, 21 U.S.C. section 360bbb-3(b)(1), unless the authorization is terminated or revoked sooner. Performed at [facility], [address] [place], [place] [address]      Labs: BNP (last 3 results) No results for input(s): BNP in the last 8760 hours. Basic Metabolic Panel: Recent Labs  Lab 09/30/18 1918 09/30/18 2229 10/01/18 0230 10/01/18 0631 10/02/18 0537  NA 139 140 143 141 136  K 4.2 4.0 4.1 3.8 3.9  CL 105 112* 114* 112* 107  CO2 12* 16* _0 GLUCOSE 705* 345* 175* 147* 264*  BUN 35* 27* 24* 23* 17  CREATININE 1.43* 0.93 0.78 0.80 0.78  CALCIUM 9.5 9.2 9.2 8.8* 8.4*  MG 2.9*  --   --   --  1.9   Liver Function Tests: Recent Labs  Lab 09/30/18 1436 10/01/18 0230  AST 45* 30  ALT 118* 79*  ALKPHOS 169* 112  BILITOT 1.4* 0.7  PROT 9.0* 6.6  ALBUMIN 5.1* 3.8   No results for input(s): LIPASE, AMYLASE in the last 168 hours. No results for input(s): AMMONIA in the last 168 hours. CBC: Recent Labs  Lab 09/30/18 1436  WBC 11.1*  NEUTROABS 8.7*  HGB 17.8*  HCT 55.6*  MCV 90.6  PLT 383   Cardiac Enzymes: No results for input(s): CKTOTAL, CKMB, CKMBINDEX, TROPONINI in the last 168 hours. BNP: Invalid input(s): POCBNP CBG: Recent Labs  Lab 10/01/18 1637 10/01/18 2053 10/02/18 0256  10/02/18 0759 10/02/18 1142  GLUCAP 226* 169* 260* 257* 291*   D-Dimer No results for input(s): DDIMER in the last 72 hours. Hgb A1c Recent Labs    09/30/18 1436  HGBA1C >15.5*   Lipid Profile No  results for input(s): CHOL, HDL, LDLCALC, TRIG, CHOLHDL, LDLDIRECT in the last 72 hours. Thyroid function studies No results for input(s): TSH, T4TOTAL, T3FREE, THYROIDAB in the last 72 hours.  Invalid input(s): FREET3 Anemia work up No results for input(s): VITAMINB12, FOLATE, FERRITIN, TIBC, IRON, RETICCTPCT in the last 72 hours. Urinalysis    Component Value Date/Time   COLORURINE STRAW (A) 09/30/2018 1256   APPEARANCEUR CLEAR 09/30/2018 1256   LABSPEC 1.025 09/30/2018 1256   PHURINE 5.0 09/30/2018 1256   GLUCOSEU >=500 (A) 09/30/2018 1256   HGBUR NEGATIVE 09/30/2018 1256   [place] 09/30/2018 1256   KETONESUR 20 (A) 09/30/2018 1256   PROTEINUR NEGATIVE 09/30/2018 1256   NITRITE NEGATIVE 09/30/2018 1256   LEUKOCYTESUR NEGATIVE 09/30/2018 1256   Sepsis Labs Invalid input(s): PROCALCITONIN,  WBC,  LACTICIDVEN Microbiology Recent Results (from the past 240 hour(s))  SARS Coronavirus 2 [facility] order, Performed in [facility] hospital lab) Nasopharyngeal Nasopharyngeal Swab     Status: None   Collection Time: 09/30/18  4:52 PM   Specimen: Nasopharyngeal Swab  Result Value Ref Range Status   SARS Coronavirus 2 NEGATIVE NEGATIVE Final    Comment: (NOTE) If result is NEGATIVE SARS-CoV-2 target nucleic acids are NOT DETECTED. The SARS-CoV-2 RNA is generally detectable in upper and lower  respiratory specimens during the acute phase of infection. The lowest  concentration of SARS-CoV-2 viral copies this assay can detect is 250  copies / mL. A negative result does not preclude SARS-CoV-2 infection  and should not be used as the sole basis for treatment or other  patient management decisions.  A negative result may occur with  improper specimen collection /  handling, submission of specimen other  than nasopharyngeal swab, presence of viral mutation(s) within the  areas targeted by this assay, and inadequate number of viral copies  (<250 copies / mL). A negative result must be combined with clinical  observations, patient history, and epidemiological information. If result is POSITIVE SARS-CoV-2 target nucleic acids are DETECTED. The SARS-CoV-2 RNA is generally detectable in upper and lower  respiratory specimens dur ing the acute phase of infection.  Positive  results are indicative of active infection with SARS-CoV-2.  Clinical  correlation with patient history and other diagnostic information is  necessary to determine patient infection status.  Positive results do  not rule out bacterial infection or co-infection with other viruses. If result is PRESUMPTIVE POSTIVE SARS-CoV-2 nucleic acids MAY BE PRESENT.   A presumptive positive result was obtained on the submitted specimen  and confirmed on repeat testing.  While 2019 novel coronavirus  (SARS-CoV-2) nucleic acids may be present in the submitted sample  additional confirmatory testing may be necessary for epidemiological  and / or clinical management purposes  to differentiate between  SARS-CoV-2 and other Sarbecovirus currently known to infect humans.  If clinically indicated additional testing with an alternate test  methodology [phone]) is advised. The SARS-CoV-2 RNA is generally  detectable in upper and lower respiratory sp ecimens during the acute  phase of infection. The expected result is Negative. Fact Sheet for Patients:  StrictlyIdeas.no Fact Sheet for Healthcare Providers: BankingDealers.co.za This test is not yet approved or cleared by the [place] FDA and has been authorized for detection and/or diagnosis of SARS-CoV-2 by FDA under an Emergency Use Authorization (EUA).  This EUA will remain in effect (meaning this  test can be used) for the duration of the COVID-19 declaration under Section 564(b)(1) of the Act, 21 U.S.C. section 360bbb-3(b)(1), unless  the authorization is terminated or revoked sooner. Performed at [facility], [address] [place], [place] [address]     Time coordinating discharge: 33 mins  SIGNED:  Irwin Brakeman, MD  [facility] 10/02/2018, 12:27 PM How to contact the [facility] Attending or Consulting provider [place] or covering provider during after hours [place], for this patient?  1. Check the care team in [facility] and look for a) attending/consulting [facility] provider listed and b) the [place] team listed 2. Log into [email or web address] and use [place]'s universal password to access. If you do not have the password, please contact the hospital operator. 3. Locate the [facility] provider you are looking for under [facility] and page to a number that you can be directly reached. 4. If you still have difficulty reaching the provider, please page the [facility] (Director on Call) for the Hospitalists listed on amion for assistance.

## 2018-10-02 NOTE — Plan of Care (Signed)
  RD consulted for nutrition education regarding diabetes.   Lab Results  Component Value Date   HGBA1C >15.5 (H) 09/30/2018    RD provided "Carbohydrate Counting for People with Diabetes" handout from the Academy of Nutrition and Dietetics. Discussed different food groups and their effects on blood sugar, emphasizing carbohydrate-containing foods. Provided list of carbohydrates and recommended serving sizes of common foods.  Discussed importance of controlled and consistent carbohydrate intake throughout the day. Provided examples of ways to balance meals/snacks and encouraged intake of high-fiber, whole grain complex carbohydrates. Teach back method used.  Expect good compliance.  Body mass index is 39.35 kg/m. Pt meets criteria for obesity based on current BMI.  Current diet order is CHO mod patient is consuming approximately 80% of meals at this time. Labs and medications reviewed. No further nutrition interventions warranted at this time. RD contact information provided. If additional nutrition issues arise, please re-consult RD.  Jeanette Pierce, RD, LDN  After Hours/Weekend Pager: [phone]

## 2018-10-02 NOTE — Progress Notes (Signed)
Inpatient Diabetes Program Recommendations  AACE/ADA: New Consensus Statement on Inpatient Glycemic Control (2015)  Target Ranges:  Prepandial:   less than 140 mg/dL      Peak postprandial:   less than 180 mg/dL (1-2 hours)      Critically ill patients:  140 - 180 mg/dL   Lab Results  Component Value Date   RKVTXL 217 (H) 10/02/2018   HGBA1C >15.5 (H) 09/30/2018    Review of Glycemic Control  Inpatient Diabetes Program Recommendations:   Spoke with pt about new diagnosis. Discussed A1C results with them and explained what an A1C is, basic pathophysiology of DM Type 2, basic home care, basic diabetes diet nutrition principles, importance of checking CBGs and maintaining good CBG control to prevent long-term and short-term complications. Reviewed signs and symptoms of hyperglycemia and hypoglycemia and how to treat hypoglycemia at home. Also reviewed blood sugar goals at home.   RNs to provide ongoing basic DM education at bedside with this patient. Have ordered educational booklet, insulin starter kit, and DM videos. Have also placed RD consult for DM diet education for this patient.  Educated patient on insulin pen use at home. Reviewed contents of insulin flexpen starter kit. Reviewed all steps if insulin pen including attachment of needle, 2-unit air shot, dialing up dose, giving injection, removing needle, disposal of sharps, storage of unused insulin, disposal of insulin etc. Patient able to provide successful return demonstration. Also reviewed troubleshooting with insulin pen. MD to give patient Rxs for insulin pens and insulin pen needles.  Reviewed nutrition basics with patient and patient has been drinking large quantities of milk and juice but willing to decrease to sugar free drinks.  Patient appreciative of education.  Thank you, Nani Gasser. Tenicia Gural, RN, MSN, CDE  Diabetes Coordinator Inpatient Glycemic Control Team Team Pager [phone] (8am-5pm) 10/02/2018 11:29 AM

## 2018-10-02 NOTE — Discharge Instructions (Signed)
°  Please check blood sugar 5 times per day and report blood sugar readings to primary care provider and diabetes providers.   IMPORTANT INFORMATION: PAY CLOSE ATTENTION   PHYSICIAN DISCHARGE INSTRUCTIONS  Follow with Primary care provider  Mikey Kirschner, MD  and other consultants as instructed by your Hospitalist Physician  [place] IF SYMPTOMS COME BACK, WORSEN OR NEW PROBLEM DEVELOPS   Please note: You were cared for by a hospitalist during your hospital stay. Every effort will be made to forward records to your primary care provider.  You can request that your primary care provider send for your hospital records if they have not received them.  Once you are discharged, your primary care physician will handle any further medical issues. Please note that NO REFILLS for any discharge medications will be authorized once you are discharged, as it is imperative that you return to your primary care physician (or establish a relationship with a primary care physician if you do not have one) for your post hospital discharge needs so that they can reassess your need for medications and monitor your lab values.  Please get a complete blood count and chemistry panel checked by your Primary MD at your next visit, and again as instructed by your Primary MD.  Get Medicines reviewed and adjusted: Please take all your medications with you for your next visit with your Primary MD  Laboratory/radiological data: Please request your Primary MD to go over all hospital tests and procedure/radiological results at the follow up, please ask your primary care provider to get all Hospital records sent to his/her office.  In some cases, they will be blood work, cultures and biopsy results pending at the time of your discharge. Please request that your primary care provider follow up on these results.  If you are diabetic, please bring your blood sugar readings with you to your  follow up appointment with primary care.    Please call and make your follow up appointments as soon as possible.    Also Note the following: If you experience worsening of your admission symptoms, develop shortness of breath, life threatening emergency, suicidal or homicidal thoughts you must seek medical attention immediately by calling 911 or calling your MD immediately  if symptoms less severe.  You must read complete instructions/literature along with all the possible adverse reactions/side effects for all the Medicines you take and that have been prescribed to you. Take any new Medicines after you have completely understood and accpet all the possible adverse reactions/side effects.   Do not drive when taking Pain medications or sleeping medications (Benzodiazepines)  Do not take more than prescribed Pain, Sleep and Anxiety Medications. It is not advisable to combine anxiety,sleep and pain medications without talking with your primary care practitioner  Special Instructions: If you have smoked or chewed Tobacco  in the last 2 yrs please stop smoking, stop any regular Alcohol  and or any Recreational drug use.  Wear Seat belts while driving.  Do not drive if taking any narcotic, mind altering or controlled substances or recreational drugs or alcohol.

## 2018-10-02 NOTE — Telephone Encounter (Signed)
Can do virtual 

## 2018-10-02 NOTE — Progress Notes (Signed)
Nsg Discharge Note  Admit Date:  09/30/2018 Discharge date: 10/02/2018   Ranae Pila to be D/C'd Home per MD order.  AVS completed. Patient able to verbalize understanding.  Discharge Medication: Allergies as of 10/02/2018      Reactions   Erythromycin Nausea And Vomiting   Nausea, vomiting      Medication List    STOP taking these medications   fluconazole 150 MG tablet Commonly known as: DIFLUCAN     TAKE these medications   atenolol 100 MG tablet Commonly known as: TENORMIN Take 1 tablet (100 mg total) by mouth daily. What changed: when to take this   blood glucose meter kit and supplies Dispense based on patient and insurance preference. Use up to four times daily as directed. (FOR ICD-10 E10.9, E11.9).   ibuprofen 800 MG tablet Commonly known as: ADVIL TAKE 1 TABLET BY MOUTH EVERY 6 HOURS AS NEEDED What changed: reasons to take this   imipramine 25 MG tablet Commonly known as: TOFRANIL Take 3 tablets (75 mg total) by mouth at bedtime.   Insulin Pen Needle 31G X 5 MM Misc 1 Device by Does not apply route as directed.   ketoconazole 2 % cream Commonly known as: NIZORAL Apply 1 application topically 2 (two) times daily. Affected skin   Lantus SoloStar 100 UNIT/ML Solostar Pen Generic drug: Insulin Glargine Inject 60 Units into the skin daily.   Maxalt 10 MG tablet Generic drug: rizatriptan Take 10 mg by mouth daily as needed (for migraine pain).   metFORMIN 500 MG 24 hr tablet Commonly known as: Glucophage XR Take 1 tablet (500 mg total) by mouth 2 (two) times daily with a meal.   NovoLOG FlexPen 100 UNIT/ML FlexPen Generic drug: insulin aspart Inject 20 Units into the skin 3 (three) times daily with meals.       Discharge Assessment: Vitals:   10/02/18 0000 10/02/18 0400  BP: 112/80 105/70  Pulse: 64 (!) 59  Resp: 18 16  Temp: 98.2 F (36.8 C) 98.1 F (36.7 C)  SpO2: 100% 98%   Skin clean, dry and intact without evidence of skin break down, no  evidence of skin tears noted. IV catheter discontinued intact. Site without signs and symptoms of complications - no redness or edema noted at insertion site, patient denies c/o pain - only slight tenderness at site.  Dressing with slight pressure applied.  D/c Instructions-Education: Discharge instructions given to patient with verbalized understanding. D/c education completed with patient including follow up instructions, medication list, d/c activities limitations if indicated, with other d/c instructions as indicated by MD - patient able to verbalize understanding, all questions fully answered. Patient instructed to return to ED, call 911, or call MD for any changes in condition.  Patient escorted via [place], and D/C home via private auto.  Santiago Stenzel Loletha Grayer, RN 10/02/2018 1:29 PM

## 2018-10-02 NOTE — ED Provider Notes (Signed)
[facility] Provider Note   CSN: [id number] Arrival date & time: 09/30/18  1216     History   Chief Complaint Chief Complaint  Patient presents with  . Hyperglycemia    HPI Jeanette Pierce is a 53 y.o. female.     Patient complains of vomiting and increased urination.  The history is provided by the patient, a relative and medical records. No language interpreter was used.  Weakness Severity:  Moderate Onset quality:  Sudden Timing:  Constant Progression:  Worsening Chronicity:  New Context: not alcohol use   Relieved by:  Nothing Worsened by:  Nothing Ineffective treatments:  None tried Associated symptoms: abdominal pain   Associated symptoms: no chest pain, no cough, no diarrhea, no frequency, no headaches and no seizures   Risk factors: no anemia     Past Medical History:  Diagnosis Date  . Diabetes mellitus without complication (HCC)   . Hypertension   . Migraine headache   . Reflux     Patient Active Problem List   Diagnosis Date Noted  . Hypertension   . DKA (diabetic ketoacidoses) (HCC) 09/30/2018  . Type 2 diabetes mellitus without complication, without long-term current use of insulin (HCC) 05/30/2017  . Migraine headache 09/16/2012  . Esophageal reflux 09/16/2012    Past Surgical History:  Procedure Laterality Date  . CHOLECYSTECTOMY  before 1999  . iliocolotomy  1999  . TUBAL LIGATION  1991     OB History   No obstetric history on file.      Home Medications    Prior to Admission medications   Medication Sig Start Date End Date Taking? Authorizing Provider  atenolol (TENORMIN) 100 MG tablet Take 1 tablet (100 mg total) by mouth daily. Patient taking differently: Take 100 mg by mouth at bedtime.  06/30/18  Yes Merlyn AlbertLuking, William S, MD  fluconazole (DIFLUCAN) 150 MG tablet Take 1 tablet (150 mg total) by mouth every other day. 09/28/18  Yes Merlyn AlbertLuking, William S, MD  ibuprofen (ADVIL,MOTRIN) 800 MG tablet TAKE 1 TABLET BY  MOUTH EVERY 6 HOURS AS NEEDED Patient taking differently: Take 800 mg by mouth every 6 (six) hours as needed for mild pain or moderate pain.  06/04/17  Yes Luking, Jonna CoupScott A, MD  imipramine (TOFRANIL) 25 MG tablet Take 4 tablets (100 mg total) by mouth at bedtime. Patient taking differently: Take 75 mg by mouth at bedtime.  06/30/18  Yes Merlyn AlbertLuking, William S, MD  ketoconazole (NIZORAL) 2 % cream Apply 1 application topically 2 (two) times daily. Affected skin 09/28/18  Yes Merlyn AlbertLuking, William S, MD  rizatriptan (MAXALT) 10 MG tablet Take 10 mg by mouth daily as needed (for migraine pain).    Yes [provider]    Family History Family History  Problem Relation Age of Onset  . Heart disease Mother   . COPD Father   . Asthma Father   . Cancer Maternal Grandfather        colon and stomach    Social History Social History   Tobacco Use  . Smoking status: Never Smoker  . Smokeless tobacco: Never Used  Substance Use Topics  . Alcohol use: Not Currently  . Drug use: Not Currently     Allergies   Erythromycin   Review of Systems Review of Systems  Constitutional: Negative for appetite change and fatigue.  HENT: Negative for congestion, ear discharge and sinus pressure.   Eyes: Negative for discharge.  Respiratory: Negative for cough.  Cardiovascular: Negative for chest pain.  Gastrointestinal: Positive for abdominal pain. Negative for diarrhea.  Genitourinary: Negative for frequency and hematuria.  Musculoskeletal: Negative for back pain.  Skin: Negative for rash.  Neurological: Positive for weakness. Negative for seizures and headaches.  Psychiatric/Behavioral: Negative for hallucinations.     Physical Exam Updated Vital Signs BP 105/70 (BP Location: Left Arm)   Pulse (!) 59   Temp 98.1 F (36.7 C) (Oral)   Resp 16   Ht 5\' 2"  (1.575 m)   Wt 97.6 kg   SpO2 98%   BMI 39.35 kg/m   Physical Exam Vitals signs and nursing note reviewed.  Constitutional:       Appearance: She is well-developed.  HENT:     Head: Normocephalic.  Eyes:     General: No scleral icterus.    Conjunctiva/sclera: Conjunctivae normal.  Neck:     Musculoskeletal: Neck supple.     Thyroid: No thyromegaly.  Cardiovascular:     Rate and Rhythm: Regular rhythm.     Heart sounds: No murmur. No friction rub. No gallop.      Comments: Tachycardia Pulmonary:     Breath sounds: No stridor. No wheezing or rales.  Chest:     Chest wall: No tenderness.  Abdominal:     General: There is no distension.     Tenderness: There is no abdominal tenderness. There is no rebound.  Musculoskeletal: Normal range of motion.  Lymphadenopathy:     Cervical: No cervical adenopathy.  Skin:    Findings: No erythema or rash.  Neurological:     Mental Status: She is oriented to person, place, and time.     Motor: No abnormal muscle tone.     Coordination: Coordination normal.  Psychiatric:        Behavior: Behavior normal.      ED Treatments / Results  Labs (all labs ordered are listed, but only abnormal results are displayed) Labs Reviewed  URINALYSIS, ROUTINE W REFLEX MICROSCOPIC - Abnormal; Notable for the following components:      Result Value   Color, Urine STRAW (*)    Glucose, UA >=500 (*)    Ketones, ur 20 (*)    All other components within normal limits  CBC WITH DIFFERENTIAL/PLATELET - Abnormal; Notable for the following components:   WBC 11.1 (*)    RBC 6.14 (*)    Hemoglobin 17.8 (*)    HCT 55.6 (*)    Neutro Abs 8.7 (*)    Abs Immature Granulocytes 0.10 (*)    All other components within normal limits  COMPREHENSIVE METABOLIC PANEL - Abnormal; Notable for the following components:   Sodium 133 (*)    Potassium 6.6 (*)    Chloride 92 (*)    CO2 12 (*)    Glucose, Bld 942 (*)    BUN 43 (*)    Creatinine, Ser 1.42 (*)    Calcium 11.2 (*)    Total Protein 9.0 (*)    Albumin 5.1 (*)    AST 45 (*)    ALT 118 (*)    Alkaline Phosphatase 169 (*)    Total  Bilirubin 1.4 (*)    GFR calc non Af Amer 43 (*)    GFR calc Af Amer 50 (*)    Anion gap >20 (*)    All other components within normal limits  BLOOD GAS, VENOUS - Abnormal; Notable for the following components:   pCO2, Ven 24.5 (*)    pO2, Ven 78.6 (*)  Bicarbonate 13.6 (*)    Acid-base deficit 15.0 (*)    All other components within normal limits  BASIC METABOLIC PANEL - Abnormal; Notable for the following components:   CO2 12 (*)    Glucose, Bld 705 (*)    BUN 35 (*)    Creatinine, Ser 1.43 (*)    GFR calc non Af Amer 43 (*)    GFR calc Af Amer 49 (*)    Anion gap >20 (*)    All other components within normal limits  BASIC METABOLIC PANEL - Abnormal; Notable for the following components:   Chloride 112 (*)    CO2 16 (*)    Glucose, Bld 345 (*)    BUN 27 (*)    All other components within normal limits  BASIC METABOLIC PANEL - Abnormal; Notable for the following components:   Chloride 114 (*)    Glucose, Bld 175 (*)    BUN 24 (*)    All other components within normal limits  BASIC METABOLIC PANEL - Abnormal; Notable for the following components:   Chloride 112 (*)    Glucose, Bld 147 (*)    BUN 23 (*)    Calcium 8.8 (*)    All other components within normal limits  MAGNESIUM - Abnormal; Notable for the following components:   Magnesium 2.9 (*)    All other components within normal limits  HEMOGLOBIN A1C - Abnormal; Notable for the following components:   Hgb A1c MFr Bld >15.5 (*)    All other components within normal limits  HEPATIC FUNCTION PANEL - Abnormal; Notable for the following components:   ALT 79 (*)    All other components within normal limits  C-PEPTIDE - Abnormal; Notable for the following components:   C-Peptide 4.6 (*)    All other components within normal limits  GLUCOSE, CAPILLARY - Abnormal; Notable for the following components:   Glucose-Capillary 226 (*)    All other components within normal limits  BASIC METABOLIC PANEL - Abnormal; Notable  for the following components:   Glucose, Bld 264 (*)    Calcium 8.4 (*)    All other components within normal limits  GLUCOSE, CAPILLARY - Abnormal; Notable for the following components:   Glucose-Capillary 169 (*)    All other components within normal limits  GLUCOSE, CAPILLARY - Abnormal; Notable for the following components:   Glucose-Capillary 260 (*)    All other components within normal limits  GLUCOSE, CAPILLARY - Abnormal; Notable for the following components:   Glucose-Capillary 257 (*)    All other components within normal limits  CBG MONITORING, ED - Abnormal; Notable for the following components:   Glucose-Capillary >600 (*)    All other components within normal limits  CBG MONITORING, ED - Abnormal; Notable for the following components:   Glucose-Capillary >600 (*)    All other components within normal limits  CBG MONITORING, ED - Abnormal; Notable for the following components:   Glucose-Capillary >600 (*)    All other components within normal limits  CBG MONITORING, ED - Abnormal; Notable for the following components:   Glucose-Capillary >600 (*)    All other components within normal limits  CBG MONITORING, ED - Abnormal; Notable for the following components:   Glucose-Capillary 558 (*)    All other components within normal limits  CBG MONITORING, ED - Abnormal; Notable for the following components:   Glucose-Capillary 445 (*)    All other components within normal limits  CBG MONITORING, ED - Abnormal; Notable  for the following components:   Glucose-Capillary 350 (*)    All other components within normal limits  CBG MONITORING, ED - Abnormal; Notable for the following components:   Glucose-Capillary 283 (*)    All other components within normal limits  CBG MONITORING, ED - Abnormal; Notable for the following components:   Glucose-Capillary 222 (*)    All other components within normal limits  CBG MONITORING, ED - Abnormal; Notable for the following components:    Glucose-Capillary 199 (*)    All other components within normal limits  CBG MONITORING, ED - Abnormal; Notable for the following components:   Glucose-Capillary 166 (*)    All other components within normal limits  CBG MONITORING, ED - Abnormal; Notable for the following components:   Glucose-Capillary 165 (*)    All other components within normal limits  CBG MONITORING, ED - Abnormal; Notable for the following components:   Glucose-Capillary 167 (*)    All other components within normal limits  CBG MONITORING, ED - Abnormal; Notable for the following components:   Glucose-Capillary 146 (*)    All other components within normal limits  CBG MONITORING, ED - Abnormal; Notable for the following components:   Glucose-Capillary 169 (*)    All other components within normal limits  CBG MONITORING, ED - Abnormal; Notable for the following components:   Glucose-Capillary 163 (*)    All other components within normal limits  CBG MONITORING, ED - Abnormal; Notable for the following components:   Glucose-Capillary 200 (*)    All other components within normal limits  SARS CORONAVIRUS 2 (HOSPITAL ORDER, PERFORMED IN [facility])  HIV ANTIBODY (ROUTINE TESTING W REFLEX)  HEPATITIS PANEL, ACUTE  MAGNESIUM  GLUTAMIC ACID DECARBOXYLASE AUTO ABS    EKG None  Radiology [place] Abdomen Limited Ruq  Result Date: 10/01/2018 CLINICAL DATA:  Elevated liver enzymes EXAM: ULTRASOUND ABDOMEN LIMITED RIGHT UPPER QUADRANT COMPARISON:  Report for CT abdomen/pelvis 12/14/1998 (images unavailable). FINDINGS: Gallbladder: Patient reportedly status post cholecystectomy. Common bile duct: Diameter: 0.5 cm Liver: No focal lesion identified. Diffusely increased echogenicity of the liver parenchyma. The interrogated main portal vein is patent on color Doppler imaging with normal direction of blood flow toward the liver. Other: Incidentally noted 4.5 x 5 x 5.1 cm cyst arising from the upper pole of the right  kidney. IMPRESSION: Diffusely increased echogenicity of the liver parenchyma. Findings may be seen in the setting of steatosis or fibrosis. Consider ultrasound elastography for further evaluation. Incidentally noted 5.1 cm right upper pole renal cyst. Electronically Signed   By: Jackey LogeKyle  Golden   On: 10/01/2018 09:09    Procedures Procedures (including critical care time)  Medications Ordered in ED Medications  atenolol (TENORMIN) tablet 100 mg (100 mg Oral Given 10/01/18 2230)  imipramine (TOFRANIL) tablet 100 mg (100 mg Oral Given 10/01/18 2230)  ketoconazole (NIZORAL) 2 % cream 1 application (1 application Topical Given 10/01/18 2231)  0.9 %  sodium chloride infusion ( Intravenous Not Given 09/30/18 2014)  enoxaparin (LOVENOX) injection 40 mg (40 mg Subcutaneous Given 10/01/18 2023)  insulin glargine (LANTUS) injection 70 Units (70 Units Subcutaneous Given 10/01/18 0946)  insulin aspart (novoLOG) injection 0-20 Units (11 Units Subcutaneous Given 10/02/18 0821)  living well with diabetes book MISC (0 each Does not apply Hold 10/01/18 0950)  insulin aspart (novoLOG) injection 20 Units (20 Units Subcutaneous Given 10/02/18 0821)  metFORMIN (GLUCOPHAGE-XR) 24 hr tablet 500 mg (500 mg Oral Given 10/02/18 0820)  pantoprazole (PROTONIX) EC tablet  40 mg (40 mg Oral Given 10/02/18 0820)  sodium chloride 0.9 % bolus 2,000 mL (0 mLs Intravenous Stopped 09/30/18 1806)  fluconazole (DIFLUCAN) tablet 150 mg (150 mg Oral Given 10/01/18 2230)  sodium chloride 0.9 % bolus 500 mL (0 mLs Intravenous Stopped 09/30/18 2015)  calcium gluconate 1 g/ 50 mL sodium chloride IVPB ( Intravenous Stopped 09/30/18 2046)  imipramine (TOFRANIL) 25 MG tablet (has no administration in time range)  ketoconazole (NIZORAL) 2 % cream (has no administration in time range)     Initial Impression / Assessment and Plan / ED Course  I have reviewed the triage vital signs and the nursing notes.  Pertinent labs & imaging results that were available during my  care of the patient were reviewed by me and considered in my medical decision making (see chart for details).        CRITICAL CARE Performed by: Bethann Berkshire Total critical care time: 45 minutes Critical care time was exclusive of separately billable procedures and treating other patients. Critical care was necessary to treat or prevent imminent or life-threatening deterioration. Critical care was time spent personally by me on the following activities: development of treatment plan with patient and/or surrogate as well as nursing, discussions with consultants, evaluation of patient's response to treatment, examination of patient, obtaining history from patient or surrogate, ordering and performing treatments and interventions, ordering and review of laboratory studies, ordering and review of radiographic studies, pulse oximetry and re-evaluation of patient's condition.  Patient in DKA she will be admitted to the ICU by medicine.  She is placed on insulin drip Final Clinical Impressions(s) / ED Diagnoses   Final diagnoses:  Elevated liver enzymes    ED Discharge Orders    None       Bethann Berkshire, MD 10/02/18 1007

## 2018-10-06 LAB — GLUTAMIC ACID DECARBOXYLASE AUTO ABS: Glutamic Acid Decarb Ab: <5 U/mL (ref 0.0–5.0)

## 2018-10-08 ENCOUNTER — Other Ambulatory Visit: Payer: Self-pay

## 2018-10-08 ENCOUNTER — Telehealth: Payer: Self-pay | Admitting: Family Medicine

## 2018-10-08 ENCOUNTER — Ambulatory Visit (INDEPENDENT_AMBULATORY_CARE_PROVIDER_SITE_OTHER): Payer: Self-pay | Admitting: Family Medicine

## 2018-10-08 DIAGNOSIS — E119 Type 2 diabetes mellitus without complications: Secondary | ICD-10-CM

## 2018-10-08 DIAGNOSIS — E1165 Type 2 diabetes mellitus with hyperglycemia: Secondary | ICD-10-CM

## 2018-10-08 DIAGNOSIS — Z794 Long term (current) use of insulin: Secondary | ICD-10-CM

## 2018-10-08 NOTE — Telephone Encounter (Signed)
Pt called Dr. Liliane Channel office and they state she needs a referral to be seen and wouldn't schedule her an appt until that is done.

## 2018-10-08 NOTE — Telephone Encounter (Signed)
Lets do ref ins dep diabetes out of control

## 2018-10-08 NOTE — Telephone Encounter (Signed)
Please advise. Thank you

## 2018-10-08 NOTE — Telephone Encounter (Signed)
Referral ordered in EPIC. 

## 2018-10-08 NOTE — Progress Notes (Signed)
   Subjective:    Patient ID: Jeanette Pierce, female    DOB: 1965/08/17, 52 y.o.   MRN: [id number]  HPI hospitalization follow up. Having blurry vision since being home from hospital.  Lowest sugar 72 Highest 152. Pt states everything is tasting like metal again. Started yesterday.   Pt states insulin was $800 and wants to know if there was any assistance programs to help with cost.    Virtual Visit via Telephone Note  I connected with Jeanette Pierce on 10/08/18 at 10:00 AM EDT by telephone and verified that I am speaking with the correct person using two identifiers.  Location: Patient: home Provider: office  I discussed the limitations, risks, security and privacy concerns of performing an evaluation and management service by telephone and the availability of in person appointments. I also discussed with the patient that there may be a patient responsible charge related to this service. The patient expressed understanding and agreed to proceed.   History of Present Illness:    Observations/Objective:   Assessment and Plan:   Follow Up Instructions:    I discussed the assessment and treatment plan with the patient. The patient was provided an opportunity to ask questions and all were answered. The patient agreed with the plan and demonstrated an understanding of the instructions.   The patient was advised to call back or seek an in-person evaluation if the symptoms worsen or if the condition fails to improve as anticipated.  I provided 20 minutes of non-face-to-face time during this encounter.  Complete hospital record reviewed in presence of patient   Review of Systems No headache no chest pain no back pain    Objective:   Physical Exam  Virtual      Assessment & Plan:  Impression type 2 diabetes.  Now insulin-dependent.  Recent hospitalization.  Morning sugars are rather tight.  Discussed.  Visual changes due to change in sugar all levels discussed.  Insulin  adjusted as noted medications.  Referral to diabetes specialist.

## 2018-10-09 ENCOUNTER — Encounter: Payer: Self-pay | Admitting: Family Medicine

## 2018-10-10 ENCOUNTER — Encounter: Payer: Self-pay | Admitting: Family Medicine

## 2018-10-12 ENCOUNTER — Telehealth: Payer: Self-pay | Admitting: *Deleted

## 2018-10-12 ENCOUNTER — Encounter: Payer: Self-pay | Admitting: Family Medicine

## 2018-10-12 NOTE — Telephone Encounter (Signed)
Called and discussed with pt. Pt verbalized understanding.  

## 2018-10-15 ENCOUNTER — Telehealth: Payer: Self-pay | Admitting: Family Medicine

## 2018-10-15 NOTE — Telephone Encounter (Signed)
Pt states Dr. Nida's office will not take her due to her being self pay. They told pt they sent us a note saying she would need to be referred to [facility].  °

## 2018-10-15 NOTE — Telephone Encounter (Signed)
Pt states Dr. Liliane Channel office will not take her due to her being self pay. They told pt they sent [place] a note saying she would need to be referred to [facility].

## 2018-10-15 NOTE — Telephone Encounter (Signed)
Adjusted referral, sent to [facility], they'll contact pt to schedule, sent pt MyChart message to notify referral was sent

## 2018-10-19 ENCOUNTER — Telehealth: Payer: Self-pay | Admitting: *Deleted

## 2018-10-19 NOTE — Telephone Encounter (Signed)
Actually overall control is good tho a bit on the tight side would decrease lantus to 40. Would keep rapeid acting the same, but when glucose is under 80 before any meal, decrease the rapid acting by four units. Not unusual to still feel weak and still have vision not ideal. Will leave future changes to endocrinologist seeing pt on the second. Still too early to go to eye doc, would give it another 4 weeks

## 2018-10-19 NOTE — Telephone Encounter (Signed)
Pt dropped off blood sugar readings. In purple folder in dr steve's folder.

## 2018-10-19 NOTE — Telephone Encounter (Signed)
Patient advised that Dr Richardson Landry reviewed her readings and stated : Actually overall control is good tho a bit on the tight side would decrease lantus to 40. Would keep rapid acting the same, but when glucose is under 80 before any meal, decrease the rapid acting by four units. Not unusual to still feel weak and still have vision not ideal. Will leave future changes to endocrinologist seeing pt on the second. Still too early to go to eye doc, would give it another 4 weeks.Patient verbalized understanding.

## 2018-10-23 ENCOUNTER — Other Ambulatory Visit: Payer: Self-pay

## 2018-10-28 ENCOUNTER — Encounter: Payer: Self-pay | Admitting: Internal Medicine

## 2018-10-28 ENCOUNTER — Other Ambulatory Visit: Payer: Self-pay

## 2018-10-28 ENCOUNTER — Ambulatory Visit: Payer: Self-pay | Admitting: Internal Medicine

## 2018-10-28 VITALS — BP 138/98 | HR 73 | Temp 98.6°F | Ht 62.0 in | Wt 217.8 lb

## 2018-10-28 DIAGNOSIS — E118 Type 2 diabetes mellitus with unspecified complications: Secondary | ICD-10-CM | POA: Insufficient documentation

## 2018-10-28 DIAGNOSIS — E1165 Type 2 diabetes mellitus with hyperglycemia: Secondary | ICD-10-CM

## 2018-10-28 MED ORDER — METFORMIN HCL ER 500 MG PO TB24: 1000.0000 mg | ORAL_TABLET | Freq: Two times a day (BID) | ORAL | 6 refills | Status: DC

## 2018-10-28 MED ORDER — LANTUS SOLOSTAR 100 UNIT/ML ~~LOC~~ SOPN: 30.0000 [IU] | PEN_INJECTOR | Freq: Every day | SUBCUTANEOUS | 1 refills | Status: DC

## 2018-10-28 MED ORDER — NOVOLOG FLEXPEN 100 UNIT/ML ~~LOC~~ SOPN: 10.0000 [IU] | PEN_INJECTOR | Freq: Three times a day (TID) | SUBCUTANEOUS | 1 refills | Status: DC

## 2018-10-28 NOTE — Progress Notes (Signed)
Name: Jeanette Pierce  MRN/ DOB: [id number], 09-11-1965   Age/ Sex: 53 y.o., female    PCP: Mikey Kirschner, MD   Reason for Endocrinology Evaluation: Type 2 Diabetes Mellitus     Date of Initial Endocrinology Visit: 10/28/2018     PATIENT IDENTIFIER: Jeanette Pierce is a 53 y.o. female with a past medical history of T2Dm and HTN. The patient presented for initial endocrinology clinic visit on 10/28/2018 for consultative assistance with her diabetes management.    HPI: Jeanette Pierce is accompanied by her sister today Renee   Diagnosed with T2DM in 2019 Prior Medications tried/Intolerance: Insulin was started 09/2018 during hospitalization for DKA  Currently checking blood sugars 3 x / day,  Before meals  Hypoglycemia episodes : Yes       Symptoms: shaky               Frequency: 2 / week Hemoglobin A1c has ranged from 6.2% in 2019, peaking at > 15.5% in 2020. Patient required assistance for hypoglycemia: no Patient has required hospitalization within the last 1 year from hyper or hypoglycemia: Yes- 09/2018. She was admitted with severe diabetic ketoacidosis AG > 20, Bicarb 12, Glucose 942 and AKI) ,  UTI and hepatic steatosis     Prior to her admission  for 2 weeks she was having polydipsia and was drinking a lot of sugar-sweetened beverages.     In terms of diet, the patient eats 3 meals a day but snacks as well due to fear of hypoglycemia    HOME DIABETES REGIMEN: Metformin 500 mg  Lantus 40 units QAM Novolog    Statin: no ACE-I/ARB: no Prior Diabetic Education: yes- during hospitalization   GLUCOSE LOG :  This AM 111 mg/dL  Lunch 133   Overall 53-110 mg/dL  DIABETIC COMPLICATIONS: Microvascular complications:     Denies: CKD, retinopathy, neuropathy   Last eye exam: Completed   Macrovascular complications:    Denies: CAD, PVD, CVA   PAST HISTORY: Past Medical History:  Past Medical History:  Diagnosis Date  . Diabetes mellitus without complication  ([place])   . Hypertension   . Migraine headache   . Reflux    Past Surgical History:  Past Surgical History:  Procedure Laterality Date  . CHOLECYSTECTOMY  before 1999  . iliocolotomy  1999  . TUBAL LIGATION  1991      Social History:  reports that she has never smoked. She has never used smokeless tobacco. She reports previous alcohol use. She reports previous drug use. Family History:  Family History  Problem Relation Age of Onset  . Heart disease Mother   . COPD Father   . Asthma Father   . Cancer Maternal Grandfather        colon and stomach     HOME MEDICATIONS: Allergies as of 10/28/2018      Reactions   Erythromycin Nausea And Vomiting   Nausea, vomiting      Medication List       Accurate as of October 28, 2018  2:20 PM. If you have any questions, ask your nurse or doctor.        atenolol 100 MG tablet Commonly known as: TENORMIN Take 1 tablet (100 mg total) by mouth daily. What changed: when to take this   blood glucose meter kit and supplies Dispense based on patient and insurance preference. Use up to four times daily as directed. (FOR ICD-10 E10.9, E11.9).   ibuprofen 800 MG tablet  Commonly known as: ADVIL TAKE 1 TABLET BY MOUTH EVERY 6 HOURS AS NEEDED What changed: reasons to take this   imipramine 25 MG tablet Commonly known as: TOFRANIL Take 3 tablets (75 mg total) by mouth at bedtime.   Insulin Pen Needle 31G X 5 MM Misc 1 Device by Does not apply route as directed.   ketoconazole 2 % cream Commonly known as: NIZORAL Apply 1 application topically 2 (two) times daily. Affected skin   Lantus SoloStar 100 UNIT/ML Solostar Pen Generic drug: Insulin Glargine Inject 60 Units into the skin daily. What changed: how much to take   Maxalt 10 MG tablet Generic drug: rizatriptan Take 10 mg by mouth daily as needed (for migraine pain).   metFORMIN 500 MG 24 hr tablet Commonly known as: Glucophage XR Take 1 tablet (500 mg total) by mouth 2  (two) times daily with a meal.   NovoLOG FlexPen 100 UNIT/ML FlexPen Generic drug: insulin aspart Inject 20 Units into the skin 3 (three) times daily with meals. What changed: how much to take        ALLERGIES: Allergies  Allergen Reactions  . Erythromycin Nausea And Vomiting    Nausea, vomiting     REVIEW OF SYSTEMS: A comprehensive ROS was conducted with the patient and is negative except as per HPI and below:  Review of Systems  Constitutional: Positive for malaise/fatigue. Negative for weight loss.  HENT: Negative for congestion and sore throat.   Eyes: Negative for blurred vision and pain.  Respiratory: Negative for cough and shortness of breath.   Cardiovascular: Negative for chest pain and palpitations.  Gastrointestinal: Positive for diarrhea. Negative for nausea.       Has chronic diarrhea   Genitourinary: Negative for frequency.  Neurological: Negative for tingling and tremors.  Endo/Heme/Allergies: Negative for polydipsia.      OBJECTIVE:   VITAL SIGNS: BP (!) 138/98 (BP Location: Left Arm, Patient Position: Sitting, Cuff Size: Normal)   Pulse 73   Temp 98.6 F (37 C)   Ht '5\' 2"'  (1.575 m)   Wt 217 lb 12.8 oz (98.8 kg)   SpO2 98%   BMI 39.84 kg/m    PHYSICAL EXAM:  General: Pt appears well and is in NAD  Hydration: Well-hydrated with moist mucous membranes and good skin turgor  HEENT: Head: Unremarkable with good dentition. Oropharynx clear without exudate.  Eyes: External eye exam normal without stare, lid lag or exophthalmos.  EOM intact.    Neck: General: Supple without adenopathy or carotid bruits. Thyroid: Thyroid size normal.  No goiter or nodules appreciated. No thyroid bruit.  Lungs: Clear with good BS bilat with no rales, rhonchi, or wheezes  Heart: RRR with normal S1 and S2 and no gallops; no murmurs; no rub  Abdomen: Normoactive bowel sounds, soft, nontender, without masses or organomegaly palpable  Extremities:  Lower extremities - No  pretibial edema. No lesions.  Skin: Normal texture and temperature to palpation. No rash noted. No Acanthosis nigricans/skin tags. No lipohypertrophy.  Neuro: MS is good with appropriate affect, pt is alert and Ox3    DM foot exam: 10/28/18  The skin of the feet is intact without sores or ulcerations. The pedal pulses are 2+ on right and 2+ on left. The sensation is intact to a screening 5.07, 10 gram monofilament bilaterally   DATA REVIEWED:  Lab Results  Component Value Date   HGBA1C >15.5 (H) 09/30/2018   HGBA1C 6.2 (A) 12/29/2017   HGBA1C 6.2 (A) 08/25/2017  Lab Results  Component Value Date   LDLCALC 113 (H) 06/03/2017   CREATININE 0.78 10/02/2018    Lab Results  Component Value Date   CHOL 203 (H) 06/03/2017   HDL 58 06/03/2017   LDLCALC 113 (H) 06/03/2017   TRIG 162 (H) 06/03/2017   CHOLHDL 3.5 06/03/2017       Results for Jeanette Pierce, Jeanette Pierce (MRN [id number]) as of 10/28/2018 12:13  Ref. Range 10/01/2018 09:14  Glutamic Acid Decarb Ab Latest Ref Range: 0.0 - 5.0 U/mL <5.0  Results for Jeanette Pierce, Jeanette Pierce (MRN [id number]) as of 10/28/2018 12:13  Ref. Range 10/01/2018 09:14  C-Peptide Latest Ref Range: 1.1 - 4.4 ng/mL 4.6 (H)   ASSESSMENT / PLAN / RECOMMENDATIONS:   1) Type 2 Diabetes Mellitus, Poorly controlled, Without complications - Most recent A1c of >15.5 %. Goal A1c < 7.0 %.    Plan: GENERAL:  Pt was diagnosed with DM in 2019, she was prescribed Metformin that she never took in the past due to fear of side effects   She has done a great job since her discharge in taking her insulin and checking her glucose.   Unable to interpret C-peptide levels without a concomitant serum glucose  She does not have insurance, and has been paying cash for insulin, we discussed long term options such as patience assistance program vs switching to [employer] brand insulin for cost effectiveness. She will let me know when its close to the time of refills.  MEDICATIONS:  Decrease Lantus to  30 units daily  Continue Novolog 10 units with each meal   Increase Metformin 500 mg to 2 tablets with Breakfast and 2 tablets with supper ( titration instructions provided)   EDUCATION / INSTRUCTIONS:  BG monitoring instructions: Patient is instructed to check her blood sugars 4 times a day, fasting and bedtime.  Call [facility] if: BG persistently < 70 or > 300. . I reviewed the Rule of 15 for the treatment of hypoglycemia in detail with the patient. Literature supplied.   2) Diabetic complications:   Eye: Unknown to have known diabetic retinopathy.   Neuro/ Feet: Does not have known diabetic peripheral neuropathy.  Renal: Patient does not have known baseline CKD. She is not on an ACEI/ARB at present.  3) Lipids: Patient is not on a statin.    4) Hypertension: Minimally above  goal of < 140/90 mmHg. Will monitor    F/u in 2 months    Signed electronically by: Mack Guise, MD  [facility]  [facility] [address] [place], [place] [address] Phone: [phone] FAX: [phone]   CC: Mikey Kirschner, [place] [address] Phone: [phone]  Fax: [phone]    Return to Endocrinology clinic as below: Future Appointments  Date Time Provider [place]  12/31/2018 10:50 AM Peace Noyes, Melanie Crazier, MD [id number] None

## 2018-10-28 NOTE — Patient Instructions (Signed)
-   Increase Metformin to 2 tablets with breakfast and 1 tablet with supper, if no vomiting or worsening diarrhea, after 1-2 weeks please increase to 2 tablets with Breakfast and 2 tablets with supper   - Decrease Lantus to 30 units daily   - Continue Novolog at 10 units with Each meal    - Check sugar before each meal and bedtime    - Choose healthy, lower carb lower calorie snacks: toss salad, cooked vegetables, cottage cheese, peanut butter, low fat cheese / string cheese, lower sodium deli meat, tuna salad or chicken salad    HOW TO TREAT LOW BLOOD SUGARS (Blood sugar LESS THAN 70 MG/DL)  Please follow the RULE OF 15 for the treatment of hypoglycemia treatment (when your (blood sugars are less than 70 mg/dL)    STEP 1: Take 15 grams of carbohydrates when your blood sugar is low, which includes:   3-4 GLUCOSE TABS  OR  3-4 OZ OF JUICE OR REGULAR SODA OR  ONE TUBE OF GLUCOSE GEL     STEP 2: RECHECK blood sugar in 15 MINUTES STEP 3: If your blood sugar is still low at the 15 minute recheck --> then, go back to STEP 1 and treat AGAIN with another 15 grams of carbohydrates.

## 2018-10-29 ENCOUNTER — Other Ambulatory Visit: Payer: Self-pay | Admitting: Family Medicine

## 2018-10-29 NOTE — Telephone Encounter (Signed)
Ok plus 3 ref 

## 2018-11-05 ENCOUNTER — Encounter: Payer: Self-pay | Admitting: Internal Medicine

## 2018-11-05 NOTE — Telephone Encounter (Signed)
error 

## 2018-11-26 ENCOUNTER — Encounter: Payer: Self-pay | Attending: Internal Medicine | Admitting: Nutrition

## 2018-11-26 ENCOUNTER — Other Ambulatory Visit: Payer: Self-pay

## 2018-11-26 ENCOUNTER — Telehealth: Payer: Self-pay | Admitting: Internal Medicine

## 2018-11-26 DIAGNOSIS — E1111 Type 2 diabetes mellitus with ketoacidosis with coma: Secondary | ICD-10-CM | POA: Insufficient documentation

## 2018-11-26 DIAGNOSIS — I10 Essential (primary) hypertension: Secondary | ICD-10-CM | POA: Insufficient documentation

## 2018-11-26 DIAGNOSIS — E1165 Type 2 diabetes mellitus with hyperglycemia: Secondary | ICD-10-CM | POA: Insufficient documentation

## 2018-11-26 NOTE — Telephone Encounter (Signed)
Blood sugars have been as follows: 10/1  88 a.m. 9/30 93 a.m. 72 lunch 79 100@6 :40 117@ 7:54 107@ 9 9/29 107 a.m. 96 at dinner and 97 at bedtime

## 2018-11-26 NOTE — Telephone Encounter (Signed)
Please advise 

## 2018-11-26 NOTE — Telephone Encounter (Signed)
Pt informed of changes and stated an understanding 

## 2018-11-26 NOTE — Telephone Encounter (Signed)
Patients states that her nutritionist wanted to see if Dr. Kelton Pillar could opt her from taking her insulin aspart (NOVOLOG FLEXPEN) 100 UNIT/ML FlexPen until her next appointment with the nutritionist on October 6th. Patient states her sugars have been running low and they want to see if taking her off would help.  Please Advise, Thanks

## 2018-11-26 NOTE — Progress Notes (Signed)
  Medical Nutrition Therapy:  Appt start time: 0800 end time:  0900.  Assessment: She had DKA about 2 months ago.  Has had DM for a few years/ Sees Dr. Kelton Pillar, Endo in [place] now. Doing much better since leaving hospital. Currently on: Lantus 30 units a day and 10 units of Novolog with meals. She notes her BS are dopping in the 70-80's and never over 130 even before bed. BS in low 90's at bedtime. Get low blood sugar symptoms before lunch and before dinner. Woke up with a low BS the other day in the 60's.  Eating three meals per day and eating better foods.. She notes she was drinking a lot of sodas and sweets before DKA. She notes she has lost some weight also and is now gaining some back.       Trying to walk for activity,     Before bed usually. Avg 80's, 107 before bedtime. avg 90 30 lantus   Preferred Learning Style:  No preference indicated    Learning Readiness:   Ready  Change in progress   MEDICATIONS: See list.   DIETARY INTAKE:  24-hr recall:  B ( 10 AM):eggs, eggs, rkey bacon and cherrios 1 1/2 c Snk ( AM):  L ( 330PM):Ham and cheese on white bread, 1 cup milk, animal crackers and 2 sf puddings., Snk ( PM): D ( 8 PM): 1 cup soup, 8 oz milk, Snk ( PM): 1030: 1 svg pudding, 1 sbg animcal crackrs,  BS before bed:  Beverages:   Usual physical activity:   Estimated energy needs: 1200-1500 calories 135 g carbohydrates 90 g protein 33 g fat  Progress Towards Goal(s):  In progress.   Nutritional Diagnosis:  NB-1.1 Food and nutrition-related knowledge deficit As related to DIabetes DKA.  As evidenced by A1C > 12%.    Intervention:  Nutrition and Diabetes education provided on My Plate, CHO counting, meal planning, portion sizes, timing of meals, avoiding snacks between meals unless having a low blood sugar, target ranges for A1C and blood sugars, signs/symptoms and treatment of hyper/hypoglycemia, monitoring blood sugars, taking medications as prescribed,  benefits of exercising 30 minutes per day and prevention of complications of DM.  Goals  Follow MY Plate  Eat three meals at times discussed. Eat 30-45 grams of carbs per meal  Drink  only water  Cut out snacks  Follow medication guidelines Ask Dr. Kelton Pillar if you can reduce Lantus and or reduce and stop Novolog. Prevent hypoglycemia Increase veggies with lunch and dinner Walk 60 minutes a day. Get A1C to 7% or less.    Teaching Method Utilized:  Visual Auditory Hands on  Handouts given during visit include:  The Plate Method   Meal Plan Card  Barriers to learning/adherence to lifestyle change: none  Demonstrated degree of understanding via:  Teach Back   Monitoring/Evaluation:  Dietary intake, exercise, , and body weight in 1 week.' May need adjusting of insulins to prevent hypoglycemia.Marland Kitchen

## 2018-11-30 ENCOUNTER — Encounter: Payer: Self-pay | Admitting: Nutrition

## 2018-11-30 NOTE — Patient Instructions (Signed)
Goals  Follow MY Plate  Eat three meals at times discussed. Eat 30-45 grams of carbs per meal  Drink  only water  Cut out snacks  Follow medication guidelines Ask Dr. Kelton Pillar if you can reduce Lantus and or reduce and stop Novolog. Prevent hypoglycemia Increase veggies with lunch and dinner Walk 60 minutes a day. Get A1C to 7% or less.

## 2018-12-01 ENCOUNTER — Other Ambulatory Visit: Payer: Self-pay

## 2018-12-01 ENCOUNTER — Encounter: Payer: Self-pay | Attending: Family Medicine | Admitting: Nutrition

## 2018-12-01 DIAGNOSIS — E1111 Type 2 diabetes mellitus with ketoacidosis with coma: Secondary | ICD-10-CM | POA: Insufficient documentation

## 2018-12-01 DIAGNOSIS — E1165 Type 2 diabetes mellitus with hyperglycemia: Secondary | ICD-10-CM | POA: Insufficient documentation

## 2018-12-01 DIAGNOSIS — I10 Essential (primary) hypertension: Secondary | ICD-10-CM | POA: Insufficient documentation

## 2018-12-01 DIAGNOSIS — E119 Type 2 diabetes mellitus without complications: Secondary | ICD-10-CM | POA: Insufficient documentation

## 2018-12-01 NOTE — Progress Notes (Signed)
  Medical Nutrition Therapy:  Appt start time: 1300 end time:  1330 Assessment: She had DKA about 2 months ago.  Has had DM for a few years/ Sees Dr. Kelton Pillar, Endo in [place] now. Here with her husband.  Dr. Kelton Pillar reduced Lantus to 24 units and 8 units Novolog due to consistent low blood sugars. FBS 83- 96,  BL 68-90, BD:67-106  Bedtime:69-130 mg./dl  She has had multiple BS less than 90 at meals time and didn't know NOT to take meal time insulin and therefore has been having multple low's between meals.  She has done an excellent job documenting meals, BS and tracking food labels and reading labels. She did get confused with "Weight Grams" vs Total CHO grams". She is eating 3 meals and multiple snacks between meals due to blood sugars dropping in the 60/70's. She is eating excessive carbs at meals trying to keep the BS in the 100's range before meals. She would like to lose weight and this constant eating to bring up her BS are preventing her further weight loss.      She would benefit from further reducing of her lantus and meal time insulin. Eating well balanced meals with good variety. Needs more lower carb veggies but has been afraid to eat them as much because of dropping BS's.       Trying to walk for a little activity,  Preferred Learning Style:  No preference indicated    Learning Readiness:   Ready  Change in progress   MEDICATIONS: See list.   DIETARY INTAKE:  Food journal reviewed which reveals she is eating 3 meals and mulitple snacks throughout the day due to dropping blood sugars in the 60/70's.  Usual physical activity:   Estimated energy needs: 1200-1500 calories 135 g carbohydrates 90 g protein 33 g fat  Progress Towards Goal(s):  In progress.   Nutritional Diagnosis:  NB-1.1 Food and nutrition-related knowledge deficit As related to DIabetes DKA.  As evidenced by A1C > 12%.    Intervention:  Nutrition and Diabetes education provided on My Plate, CHO  counting, meal planning, portion sizes, timing of meals, avoiding snacks between meals unless having a low blood sugar, target ranges for A1C and blood sugars, signs/symptoms and treatment of hyper/hypoglycemia, monitoring blood sugars, taking medications as prescribed, benefits of exercising 30 minutes per day and prevention of complications of DM. Meal planning, reading food labels and preventing hypoglycemia.  Goals Call Dr. Kelton Pillar and request further reduction in insulin . Eat meals on time. IF BS are less than 70, 2-3 times a week, call Dr. Kelton Pillar again. DO NOT TAKE meal time insulin when BS are less than 90 BEFORE meals. Keep eating 45 grams of carbs per meals . Follow The Plate Method Exercise when able.  Prevent hypoglycemia.    Teaching Method Utilized:  Visual Auditory Hands on  Handouts given during visit include:  The Plate Method   Meal Plan Card  Barriers to learning/adherence to lifestyle change: none  Demonstrated degree of understanding via:  Teach Back   Monitoring/Evaluation:  Dietary intake, exercise, , and body weight in 1 month.She would benefit form further reduction of insulins to prevent hypoglycemia.. Suggest to try to stop meal time insulin to see how BS's are with just basal insulin.

## 2018-12-02 ENCOUNTER — Encounter: Payer: Self-pay | Admitting: Nutrition

## 2018-12-02 NOTE — Patient Instructions (Signed)
Goals Call Dr. Kelton Pillar and request further reduction in insulin . Eat meals on time. IF BS are less than 70, 2-3 times a week, call Dr. Kelton Pillar again. DO NOT TAKE meal time insulin when BS are less than 90 BEFORE meals. Keep eating 45 grams of carbs per meals . Follow The Plate Method Exercise when able.  Prevent hypoglycemia.

## 2018-12-03 ENCOUNTER — Encounter: Payer: Self-pay | Admitting: Nutrition

## 2018-12-04 ENCOUNTER — Encounter: Payer: Self-pay | Admitting: Internal Medicine

## 2018-12-29 ENCOUNTER — Other Ambulatory Visit: Payer: Self-pay

## 2018-12-31 ENCOUNTER — Ambulatory Visit: Payer: Self-pay | Admitting: Internal Medicine

## 2018-12-31 ENCOUNTER — Encounter: Payer: Self-pay | Admitting: Internal Medicine

## 2018-12-31 VITALS — BP 132/84 | HR 75 | Temp 98.4°F | Ht 62.0 in | Wt 220.2 lb

## 2018-12-31 DIAGNOSIS — E119 Type 2 diabetes mellitus without complications: Secondary | ICD-10-CM

## 2018-12-31 LAB — POCT GLYCOSYLATED HEMOGLOBIN (HGB A1C): Hemoglobin A1C: 6 % — AB (ref 4.0–5.6)

## 2018-12-31 MED ORDER — LANTUS SOLOSTAR 100 UNIT/ML ~~LOC~~ SOPN: 18.0000 [IU] | PEN_INJECTOR | Freq: Every day | SUBCUTANEOUS | 1 refills | Status: DC

## 2018-12-31 NOTE — Patient Instructions (Addendum)
-   Fasting goal between 80- 130 mg/dL   - STOP Novolog  - Decrease Lantus to 18 units       HOW TO TREAT LOW BLOOD SUGARS (Blood sugar LESS THAN 70 MG/DL)  Please follow the RULE OF 15 for the treatment of hypoglycemia treatment (when your (blood sugars are less than 70 mg/dL)    STEP 1: Take 15 grams of carbohydrates when your blood sugar is low, which includes:   3-4 GLUCOSE TABS  OR  3-4 OZ OF JUICE OR REGULAR SODA OR  ONE TUBE OF GLUCOSE GEL     STEP 2: RECHECK blood sugar in 15 MINUTES STEP 3: If your blood sugar is still low at the 15 minute recheck --> then, go back to STEP 1 and treat AGAIN with another 15 grams of carbohydrates.

## 2018-12-31 NOTE — Progress Notes (Signed)
Name: Jeanette Pierce  Age/ Sex: 53 y.o., female   MRN/ DOB: [id number], 1965-11-14     PCP: Mikey Kirschner, MD   Reason for Endocrinology Evaluation: Type 2 Diabetes Mellitus  Initial Endocrine Consultative Visit: 10/28/2018    PATIENT IDENTIFIER: [name] is a 53 y.o. female with a past medical history of T2DM. The patient has followed with Endocrinology clinic since 10/28/2018 for consultative assistance with management of her diabetes.  DIABETIC HISTORY:  Jeanette Pierce was diagnosed with DM in 2019, pt had a prescription for metformin but she admits to not taking it, she was eventually started on insulin 09/2018 during hospitalization for DKA. Her hemoglobin A1c has ranged from 6.2% in 2019, peaking at > 15.5% in 2020.   On her initial presentation to our clinic she was on metformin, lantus and novolog with an A1c > 15.5%    SUBJECTIVE:   During the last visit (10/28/2018): A1c > 15.5%, reduced lantus, continued Novolog and increased Metformin   Today (01/01/2019): Jeanette Pierce is here for a 2 month follow up on diabetes.  She checks her blood sugars 5 times daily, preprandial. The patient has had had hypoglycemic episodes since the last clinic visit, which typically occur 1 x / week - most often occuring during the day. The patient is  symptomatic with these episodes, with symptoms of. Otherwise, the patient has not required any recent emergency interventions for hypoglycemia and has not had recent hospitalizations secondary to hyper or hypoglycemic episodes.    ROS: As per HPI and as detailed below: Review of Systems  Constitutional: Negative for chills and fever.  Respiratory: Negative for cough and shortness of breath.   Cardiovascular: Negative for chest pain and palpitations.  Gastrointestinal: Negative for diarrhea and nausea.      HOME DIABETES REGIMEN:  Lantus 24 units daily  Novolog 6 units TIDQAC Metformin 500 mg 2 tabs BID    METER DOWNLOAD SUMMARY: Date range  evaluated: 10/23-11/06/2018 Fingerstick Blood Glucose Tests = 76 Average Number Tests/Day = 5.4 Overall Mean FS Glucose = 98   BG Ranges: Low = 60 High = 178   Hypoglycemic Events/30 Days: BG < 50 = 0 Episodes of symptomatic severe hypoglycemia = 0    HISTORY:  Past Medical History:  Past Medical History:  Diagnosis Date  . Diabetes mellitus without complication ([place])   . Hypertension   . Migraine headache   . Reflux    Past Surgical History:  Past Surgical History:  Procedure Laterality Date  . CHOLECYSTECTOMY  before 1999  . iliocolotomy  1999  . TUBAL LIGATION  1991    Social History:  reports that she has never smoked. She has never used smokeless tobacco. She reports previous alcohol use. She reports previous drug use. Family History:  Family History  Problem Relation Age of Onset  . Heart disease Mother   . COPD Father   . Asthma Father   . Cancer Maternal Grandfather        colon and stomach     HOME MEDICATIONS: Allergies as of 12/31/2018      Reactions   Erythromycin Nausea And Vomiting   Nausea, vomiting      Medication List       Accurate as of December 31, 2018 11:59 PM. If you have any questions, ask your nurse or doctor.        STOP taking these medications   ketoconazole 2 % cream Commonly known as: NIZORAL Stopped  by: Dorita Sciara, MD   NovoLOG FlexPen 100 UNIT/ML FlexPen Generic drug: insulin aspart Stopped by: Dorita Sciara, MD     TAKE these medications   atenolol 100 MG tablet Commonly known as: TENORMIN Take 1 tablet (100 mg total) by mouth daily. What changed: when to take this   blood glucose meter kit and supplies Dispense based on patient and insurance preference. Use up to four times daily as directed. (FOR ICD-10 E10.9, E11.9).   ibuprofen 800 MG tablet Commonly known as: ADVIL TAKE 1 TABLET BY MOUTH EVERY 6 HOURS AS NEEDED   imipramine 25 MG tablet Commonly known as: TOFRANIL Take 3 tablets  (75 mg total) by mouth at bedtime.   Insulin Pen Needle 31G X 5 MM Misc 1 Device by Does not apply route as directed.   Lantus SoloStar 100 UNIT/ML Solostar Pen Generic drug: Insulin Glargine Inject 18 Units into the skin daily. What changed: how much to take Changed by: Dorita Sciara, MD   Maxalt 10 MG tablet Generic drug: rizatriptan Take 10 mg by mouth daily as needed (for migraine pain).   metFORMIN 500 MG 24 hr tablet Commonly known as: Glucophage XR Take 2 tablets (1,000 mg total) by mouth 2 (two) times daily with a meal.        OBJECTIVE:   Vital Signs: BP 132/84 (BP Location: Left Arm, Patient Position: Sitting, Cuff Size: Large)   Pulse 75   Temp 98.4 F (36.9 C) (Oral)   Ht '5\' 2"'  (1.575 m)   Wt 220 lb 3.2 oz (99.9 kg)   SpO2 97%   BMI 40.28 kg/m   Wt Readings from Last 3 Encounters:  12/31/18 220 lb 3.2 oz (99.9 kg)  12/01/18 218 lb (98.9 kg)  10/28/18 217 lb 12.8 oz (98.8 kg)     Exam: General: Pt appears well and is in NAD  Lungs: Clear with good BS bilat with no rales, rhonchi, or wheezes  Heart: RRR with normal S1 and S2 and no gallops; no murmurs; no rub  Abdomen: Normoactive bowel sounds, soft, nontender, without masses or organomegaly palpable  Extremities: No pretibial edema.   Neuro: MS is good with appropriate affect, pt is alert and Ox3    DM foot exam: 12/31/2018  The skin of the feet is intact without sores or ulcerations. The pedal pulses are 2+ on right and 2+ on left. The sensation is intact to a screening 5.07, 10 gram monofilament bilaterally        DATA REVIEWED:  Lab Results  Component Value Date   HGBA1C 6.0 (A) 12/31/2018   HGBA1C >15.5 (H) 09/30/2018   HGBA1C 6.2 (A) 12/29/2017   Lab Results  Component Value Date   LDLCALC 113 (H) 06/03/2017   CREATININE 0.78 10/02/2018   No results found for: [facility]   Lab Results  Component Value Date   CHOL 203 (H) 06/03/2017   HDL 58 06/03/2017   LDLCALC  113 (H) 06/03/2017   TRIG 162 (H) 06/03/2017   CHOLHDL 3.5 06/03/2017         ASSESSMENT / PLAN / RECOMMENDATIONS:   1) Type 2 Diabetes Mellitus, Optimally controlled, Without complications - Most recent A1c of 6.0 %. Goal A1c < 7.0 %.    - She has done a great changes with lifestyle, I have praised on her on the discipline with eating and taking her insulin . She is having fear of hypoglycemia, and tends to correct with normal glucose readings, she  is under the impression that her BG's should be in the range of 120-150 mg/dL. We again discussed ranges between 70-130 is the fasting goal.   - Will stop prandial insulin , will also reduce her lantus as below  - She was instructed to check glucose fasting and bedtime now that she is not on novolog anymore unless she wishes to check more frequently on her own.    MEDICATIONS: - STOP Novolog  - Decrease Lantus to 18 units  - Continue Metformin 500 mg 2 tabs BID   EDUCATION / INSTRUCTIONS:  BG monitoring instructions: Patient is instructed to check her blood sugars 2 times a day, fasting and bedtime   Call [facility] if: BG persistently < 70 or > 300. . I reviewed the Rule of 15 for the treatment of hypoglycemia in detail with the patient. Literature supplied.     F/U in 3 months    Signed electronically by: Mack Guise, MD  [facility] Endocrin[facility]  [facility] [address] [place], [place] [address] Phone: [phone] FAX: [phone]   CC: Mikey Kirschner, [place] [address] Phone: [phone]  Fax: [phone]  Return to Endocrinology clinic as below: Future Appointments  Date Time Provider [place]  01/06/2019  1:00 PM Arlana Lindau, RD [facility] None  04/08/2019 10:30 AM Daisi Kentner, Melanie Crazier, MD [facility] None

## 2019-01-06 ENCOUNTER — Encounter: Payer: Self-pay | Attending: Family Medicine | Admitting: Nutrition

## 2019-01-06 ENCOUNTER — Other Ambulatory Visit: Payer: Self-pay

## 2019-01-06 ENCOUNTER — Encounter: Payer: Self-pay | Admitting: Nutrition

## 2019-01-06 NOTE — Patient Instructions (Signed)
Goals Keep up the great job! Eat 30-45 grams of carbs per meal Check BS in am and time. Let Dr. Gwyndolyn Kaufman if BS are dropping below 70's. Increase physical actvity. Lose 1 lb per week

## 2019-01-06 NOTE — Progress Notes (Signed)
Phone visit. Medical Nutrition Therapy:  Appt start time: 1300 end time:  1330 Assessment: She had DKA about 2 months ago.  Has had DM for a few years/ Sees Dr. Kelton Pillar, Endo in [place] now. Saw Dr/ [facility] recently. Reduced Lantus to 18 units a day now and discontinue Novolog.  FBS 78-109 mg/dl.   Metformin XR 500 mg BID. Eating three meals per day. Reading food labels Counting CHO with meals.  A1C was 6% was ordinally  6.5%. Doing very well. Eating well balanced meals. Measuring foods out. Avoiding snacks and better meal planning. BS are doing very well.   Trying to walk for a little activity,  Preferred Learning Style:  No preference indicated    Learning Readiness:   Ready  Change in progress   MEDICATIONS: See list.   DIETARY INTAKE:  B) Oatmeal or cherries or frosted mini wheats  Usual physical activity:   Estimated energy needs: 1200-1500 calories 135 g carbohydrates 90 g protein 33 g fat  Progress Towards Goal(s):  In progress.   Nutritional Diagnosis:  NB-1.1 Food and nutrition-related knowledge deficit As related to DIabetes DKA.  As evidenced by A1C > 12%.    Intervention:  Nutrition and Diabetes education provided on My Plate, CHO counting, meal planning, portion sizes, timing of meals, avoiding snacks between meals unless having a low blood sugar, target ranges for A1C and blood sugars, signs/symptoms and treatment of hyper/hypoglycemia, monitoring blood sugars, taking medications as prescribed, benefits of exercising 30 minutes per day and prevention of complications of DM. Meal planning, reading food labels and preventing hypoglycemia.  Goals Keep up the great job! Eat 30-45 grams of carbs per meal Check BS in am and bedtime. Let Dr. Gwyndolyn Kaufman if BS are dropping below 70's. Increase physical actvity. Lose 1 lb per week  Teaching Method Utilized:  Visual Auditory Hands on  Handouts given during visit include:  The Plate Method   Meal Plan  Card  Barriers to learning/adherence to lifestyle change: none  Demonstrated degree of understanding via:  Teach Back   Monitoring/Evaluation:  Dietary intake, exercise, , and body weight in 3 month.

## 2019-01-18 ENCOUNTER — Encounter: Payer: Self-pay | Admitting: Internal Medicine

## 2019-01-19 ENCOUNTER — Other Ambulatory Visit: Payer: Self-pay | Admitting: Internal Medicine

## 2019-01-19 MED ORDER — LANTUS SOLOSTAR 100 UNIT/ML ~~LOC~~ SOPN: 14.0000 [IU] | PEN_INJECTOR | Freq: Every day | SUBCUTANEOUS | 1 refills | Status: DC

## 2019-01-20 ENCOUNTER — Other Ambulatory Visit: Payer: Self-pay

## 2019-01-20 DIAGNOSIS — Z20822 Contact with and (suspected) exposure to covid-19: Secondary | ICD-10-CM

## 2019-01-22 LAB — NOVEL CORONAVIRUS, NAA: SARS-CoV-2, NAA: NOT DETECTED

## 2019-02-11 ENCOUNTER — Encounter: Payer: Self-pay | Admitting: Internal Medicine

## 2019-03-02 ENCOUNTER — Other Ambulatory Visit: Payer: Self-pay | Admitting: Family Medicine

## 2019-03-11 ENCOUNTER — Ambulatory Visit: Payer: Self-pay | Attending: Internal Medicine

## 2019-03-11 ENCOUNTER — Other Ambulatory Visit: Payer: Self-pay

## 2019-03-11 DIAGNOSIS — Z20822 Contact with and (suspected) exposure to covid-19: Secondary | ICD-10-CM | POA: Insufficient documentation

## 2019-03-12 LAB — NOVEL CORONAVIRUS, NAA: SARS-CoV-2, NAA: NOT DETECTED

## 2019-03-21 ENCOUNTER — Other Ambulatory Visit: Payer: Self-pay | Admitting: Family Medicine

## 2019-03-31 ENCOUNTER — Encounter: Payer: Self-pay | Admitting: Family Medicine

## 2019-04-04 ENCOUNTER — Other Ambulatory Visit: Payer: Self-pay | Admitting: Family Medicine

## 2019-04-06 ENCOUNTER — Other Ambulatory Visit: Payer: Self-pay

## 2019-04-08 ENCOUNTER — Other Ambulatory Visit: Payer: Self-pay

## 2019-04-08 ENCOUNTER — Ambulatory Visit (INDEPENDENT_AMBULATORY_CARE_PROVIDER_SITE_OTHER): Payer: Self-pay | Admitting: Internal Medicine

## 2019-04-08 VITALS — BP 132/88 | HR 83 | Temp 98.3°F | Ht 62.0 in | Wt 225.8 lb

## 2019-04-08 DIAGNOSIS — E119 Type 2 diabetes mellitus without complications: Secondary | ICD-10-CM

## 2019-04-08 LAB — LIPID PANEL
Cholesterol: 179 mg/dL (ref 0–200)
HDL: 55.7 mg/dL (ref >39.00)
NonHDL: 123.62
Total CHOL/HDL Ratio: 3
Triglycerides: 232 mg/dL — ABNORMAL HIGH (ref 0.0–149.0)
VLDL: 46.4 mg/dL — ABNORMAL HIGH (ref 0.0–40.0)

## 2019-04-08 LAB — LDL CHOLESTEROL, DIRECT: Direct LDL: 100 mg/dL

## 2019-04-08 LAB — MICROALBUMIN / CREATININE URINE RATIO
Creatinine,U: 65.4 mg/dL
Microalb Creat Ratio: 6.2 mg/g (ref 0.0–30.0)
Microalb, Ur: 4.1 mg/dL — ABNORMAL HIGH (ref 0.0–1.9)

## 2019-04-08 LAB — POCT GLYCOSYLATED HEMOGLOBIN (HGB A1C): Hemoglobin A1C: 5.9 % — AB (ref 4.0–5.6)

## 2019-04-08 NOTE — Progress Notes (Signed)
Name: Jeanette Pierce  Age/ Sex: 54 y.o., female   MRN/ DOB: [id number], 1965/11/16     PCP: Mikey Kirschner, MD   Reason for Endocrinology Evaluation: Type 2 Diabetes Mellitus  Initial Endocrine Consultative Visit: 10/28/2018    PATIENT IDENTIFIER: Jeanette Pierce is a 54 y.o. female with a past medical history of T2DM. The patient has followed with Endocrinology clinic since 10/28/2018 for consultative assistance with management of her diabetes.  DIABETIC HISTORY:  Jeanette Pierce was diagnosed with DM in 2019, pt had a prescription for metformin but she admits to not taking it, she was eventually started on insulin 09/2018 during hospitalization for DKA. Her hemoglobin A1c has ranged from 6.2% in 2019, peaking at > 15.5% in 2020.   On her initial presentation to our clinic she was on metformin, lantus and novolog with an A1c > 15.5%    SUBJECTIVE:   During the last visit (12/31/2018): A1c 6.0%, reduced lantus, stopped Novolog and continued  Metformin   Today (04/09/2019): Jeanette Pierce is here for a 3 month follow up on diabetes.  She checks her blood sugars 2 times daily, preprandial and bedtime.  She is accompanied by her husband today.  The patient has not had hypoglycemic episodes since the last clinic visit. Otherwise, the patient has not required any recent emergency interventions for hypoglycemia and has not had recent hospitalizations secondary to hyper or hypoglycemic episodes.    Eats 2-3 meals a day, occasional snacking.   ROS: As per HPI and as detailed below: Review of Systems  Constitutional: Negative for chills and fever.  Respiratory: Negative for cough and shortness of breath.   Cardiovascular: Negative for chest pain and palpitations.  Gastrointestinal: Negative for diarrhea and nausea.      HOME DIABETES REGIMEN:  Lantus 10 units daily  Metformin 500 mg 2 tabs BID    METER DOWNLOAD SUMMARY:  Glucose log 102- 143 mg/dL        HISTORY:  Past Medical History:   Past Medical History:  Diagnosis Date  . Diabetes mellitus without complication ([place])   . Hypertension   . Migraine headache   . Reflux    Past Surgical History:  Past Surgical History:  Procedure Laterality Date  . CHOLECYSTECTOMY  before 1999  . iliocolotomy  1999  . TUBAL LIGATION  1991    Social History:  reports that she has never smoked. She has never used smokeless tobacco. She reports previous alcohol use. She reports previous drug use. Family History:  Family History  Problem Relation Age of Onset  . Heart disease Mother   . COPD Father   . Asthma Father   . Cancer Maternal Grandfather        colon and stomach     HOME MEDICATIONS: Allergies as of 04/08/2019      Reactions   Erythromycin Nausea And Vomiting   Nausea, vomiting      Medication List       Accurate as of April 08, 2019 11:59 PM. If you have any questions, ask your nurse or doctor.        atenolol 100 MG tablet Commonly known as: TENORMIN Take 1 tablet by mouth once daily   blood glucose meter kit and supplies Dispense based on patient and insurance preference. Use up to four times daily as directed. (FOR ICD-10 E10.9, E11.9).   ibuprofen 800 MG tablet Commonly known as: ADVIL TAKE 1 TABLET BY MOUTH EVERY 6 HOURS AS NEEDED  imipramine 25 MG tablet Commonly known as: TOFRANIL TAKE 4 TABLETS BY MOUTH AT BEDTIME   Insulin Pen Needle 31G X 5 MM Misc 1 Device by Does not apply route as directed.   Lantus SoloStar 100 UNIT/ML Solostar Pen Generic drug: Insulin Glargine Inject 14 Units into the skin daily.   Maxalt 10 MG tablet Generic drug: rizatriptan Take 10 mg by mouth daily as needed (for migraine pain).   metFORMIN 500 MG 24 hr tablet Commonly known as: Glucophage XR Take 2 tablets (1,000 mg total) by mouth 2 (two) times daily with a meal.        OBJECTIVE:   Vital Signs: BP 132/88 (BP Location: Right Arm, Patient Position: Sitting, Cuff Size: Normal)   Pulse 83    Temp 98.3 F (36.8 C)   Ht '5\' 2"'  (1.575 m)   Wt 225 lb 12.8 oz (102.4 kg)   SpO2 99%   BMI 41.30 kg/m   Wt Readings from Last 3 Encounters:  04/08/19 225 lb 12.8 oz (102.4 kg)  01/06/19 220 lb (99.8 kg)  12/31/18 220 lb 3.2 oz (99.9 kg)     Exam: General: Pt appears well and is in NAD  Lungs: Clear with good BS bilat with no rales, rhonchi, or wheezes  Heart: RRR with normal S1 and S2 and no gallops; no murmurs; no rub  Abdomen: Normoactive bowel sounds, soft, nontender, without masses or organomegaly palpable  Extremities: No pretibial edema.   Neuro: MS is good with appropriate affect, pt is alert and Ox3    DM foot exam: 04/08/2019  The skin of the feet is intact without sores or ulcerations. The pedal pulses are 2+ on right and 2+ on left. The sensation is intact to a screening 5.07, 10 gram monofilament bilaterally    DATA REVIEWED:  Lab Results  Component Value Date   HGBA1C 5.9 (A) 04/08/2019   HGBA1C 6.0 (A) 12/31/2018   HGBA1C >15.5 (H) 09/30/2018   Lab Results  Component Value Date   MICROALBUR 4.1 (H) 04/08/2019   LDLCALC 113 (H) 06/03/2017   CREATININE 0.78 10/02/2018    Lab Results  Component Value Date   CHOL 179 04/08/2019   HDL 55.70 04/08/2019   LDLCALC 113 (H) 06/03/2017   LDLDIRECT 100.0 04/08/2019   TRIG 232.0 (H) 04/08/2019   CHOLHDL 3 04/08/2019         ASSESSMENT / PLAN / RECOMMENDATIONS:   1) Type 2 Diabetes Mellitus, Optimally controlled, Without complications - Most recent A1c of 5.9 %. Goal A1c < 7.0 %.    -She continues to do great with lifestyle changes and glycemic control. -Given that her A1c today is 5.9%, I have recommended that she stops the Lantus but continue with metformin. -Normal microalbumin urea testing today -Patient urged to have an eye exam  MEDICATIONS:   - Stop Lantus  -Continue Metformin 500 mg 2 tabs BID   EDUCATION / INSTRUCTIONS:  BG monitoring instructions: Patient is instructed to check her  blood sugars 3 times a week, fasting   Call [facility] if: BG persistently < 70 or > 300. . I reviewed the Rule of 15 for the treatment of hypoglycemia in detail with the patient. Literature supplied.   2.Hypertriglyceridemia:   -Triglycerides are elevated, LDL is borderline.  I would recommend starting a statin for cardiovascular benefits.  A portal message has been sent to the patient awaiting response prior to proceeding with prescription.  F/U in 4 months  Signed electronically by: Mack Guise, MD  [facility] Endocrinology  [facility] [address] [place], [place] [address] Phone: [phone] FAX: [phone]   CC: Mikey Kirschner, [place] [address] Phone: [phone]  Fax: [phone]  Return to Endocrinology clinic as below: Future Appointments  Date Time Provider [place]  04/19/2019 10:00 AM Arlana Lindau, RD [facility] None  08/06/2019 10:30 AM Briauna Gilmartin, Melanie Crazier, MD [facility] None

## 2019-04-08 NOTE — Patient Instructions (Signed)
-    Keep up the good work, you have done an amazing job !!! - STOP Lantus  - Continue Metformin 2 tablets with Breakfast and 2 tablets with Supper

## 2019-04-09 ENCOUNTER — Encounter: Payer: Self-pay | Admitting: Internal Medicine

## 2019-04-16 ENCOUNTER — Encounter: Payer: Self-pay | Admitting: Internal Medicine

## 2019-04-16 MED ORDER — ATORVASTATIN CALCIUM 10 MG PO TABS: 10.0000 mg | ORAL_TABLET | Freq: Every day | ORAL | 3 refills | Status: DC

## 2019-04-19 ENCOUNTER — Ambulatory Visit: Payer: Self-pay | Admitting: Nutrition

## 2019-04-26 ENCOUNTER — Other Ambulatory Visit: Payer: Self-pay

## 2019-04-26 MED ORDER — METFORMIN HCL ER 500 MG PO TB24: 1000.0000 mg | ORAL_TABLET | Freq: Two times a day (BID) | ORAL | 6 refills | Status: DC

## 2019-06-13 ENCOUNTER — Other Ambulatory Visit: Payer: Self-pay | Admitting: Family Medicine

## 2019-06-14 NOTE — Telephone Encounter (Signed)
Ok times one, call pt and rec f to f f u

## 2019-06-14 NOTE — Telephone Encounter (Signed)
Scheduled 5/19

## 2019-07-14 ENCOUNTER — Ambulatory Visit (INDEPENDENT_AMBULATORY_CARE_PROVIDER_SITE_OTHER): Payer: Self-pay | Admitting: Family Medicine

## 2019-07-14 ENCOUNTER — Other Ambulatory Visit: Payer: Self-pay

## 2019-07-14 VITALS — BP 126/84 | Temp 97.5°F | Ht 62.0 in | Wt 231.0 lb

## 2019-07-14 DIAGNOSIS — I10 Essential (primary) hypertension: Secondary | ICD-10-CM

## 2019-07-14 DIAGNOSIS — G43109 Migraine with aura, not intractable, without status migrainosus: Secondary | ICD-10-CM

## 2019-07-14 MED ORDER — IMIPRAMINE HCL 25 MG PO TABS: 100.0000 mg | ORAL_TABLET | Freq: Every day | ORAL | 5 refills | Status: DC

## 2019-07-14 MED ORDER — ATENOLOL 100 MG PO TABS: 100.0000 mg | ORAL_TABLET | Freq: Every day | ORAL | 1 refills | Status: DC

## 2019-07-14 NOTE — Progress Notes (Signed)
   Subjective:    Patient ID: Jeanette Pierce, female    DOB: 02/25/1966, 54 y.o.   MRN: [id number]  Hypertension This is a chronic problem. There are no compliance problems (tries to eat healthy, does moderate walking, takes meds everyday).    Sees diabetic specialist. Pt states blood sugars are doing good.   Spot on chest that has been there for awhile but this past month has gotten darker and started itching and burning.   Blood pressure medicine and blood pressure levels reviewed today with patient. Compliant with blood pressure medicine. States does not miss a dose. No obvious side effects. Blood pressure generally good when checked elsewhere. Watching salt intake.  Migraines doable, not very often, takes ibu prn, usually ibu takes care, uses maxalt on occas, and nightly imipramine   Review of Systems No headache, no major weight loss or weight gain, no chest pain no back pain abdominal pain no change in bowel habits complete ROS otherwise negative     Objective:   Physical Exam   Alert vitals stable, NAD. Blood pressure good on repeat. HEENT normal. Lungs clear. Heart regular rate and rhythm.      Assessment & Plan:  Impression 1 migraine headaches.  Clinically stable.  Patient to maintain prophylaxis and as needed Maxalt  2.  Hypertension.  Good control discussed maintain same meds  Follow-up every 6 months meds refilled diet exercise discussed

## 2019-08-06 ENCOUNTER — Ambulatory Visit (INDEPENDENT_AMBULATORY_CARE_PROVIDER_SITE_OTHER): Payer: Self-pay | Admitting: Internal Medicine

## 2019-08-06 ENCOUNTER — Encounter: Payer: Self-pay | Admitting: Internal Medicine

## 2019-08-06 ENCOUNTER — Other Ambulatory Visit: Payer: Self-pay

## 2019-08-06 VITALS — BP 114/78 | HR 87 | Ht 62.0 in | Wt 229.4 lb

## 2019-08-06 DIAGNOSIS — E119 Type 2 diabetes mellitus without complications: Secondary | ICD-10-CM

## 2019-08-06 LAB — POCT GLYCOSYLATED HEMOGLOBIN (HGB A1C): Hemoglobin A1C: 6.6 % — AB (ref 4.0–5.6)

## 2019-08-06 NOTE — Progress Notes (Signed)
Name: Jeanette Pierce  Age/ Sex: 54 y.o., female   MRN/ DOB: [id number], 1965/11/04     PCP: Mikey Kirschner, MD   Reason for Endocrinology Evaluation: Type 2 Diabetes Mellitus  Initial Endocrine Consultative Visit: 10/28/2018    PATIENT IDENTIFIER: Jeanette Pierce is a 54 y.o. female with a past medical history of T2DM. The patient has followed with Endocrinology clinic since 10/28/2018 for consultative assistance with management of her diabetes.  DIABETIC HISTORY:  [name] was diagnosed with DM in 2019, pt had a prescription for metformin but she admits to not taking it, she was eventually started on insulin 09/2018 during hospitalization for DKA. Her hemoglobin A1c has ranged from 6.2% in 2019, peaking at > 15.5% in 2020.   On her initial presentation to our clinic she was on metformin, lantus and novolog with an A1c > 15.5%   We stopped her MDI regimen by 03/2019 with an A1c of 5.9%  SUBJECTIVE:   During the last visit (04/08/2019): A1c 5.9 %, stopped lantus,and continued  Metformin   Today (08/06/2019): [name] is here for a 3 month follow up on diabetes.  She checks her blood sugars 2 times daily, preprandial and bedtime.  She is accompanied by her husband today.  The patient has not had hypoglycemic episodes since the last clinic visit. Otherwise, the patient has not required any recent emergency interventions for hypoglycemia and has not had recent hospitalizations secondary to hyper or hypoglycemic episodes.    Eats 2-3 meals a day, occasional snacking.   ROS: As per HPI and as detailed below: Review of Systems  Constitutional: Negative for chills and fever.  Respiratory: Negative for cough and shortness of breath.   Cardiovascular: Negative for chest pain and palpitations.  Gastrointestinal: Negative for diarrhea and nausea.      HOME DIABETES REGIMEN:  Metformin 500 mg 2 tabs BID      METER DOWNLOAD SUMMARY: Did not bring  Glucose log 110- 128 mg/dL         HISTORY:  Past Medical History:  Past Medical History:  Diagnosis Date  . Diabetes mellitus without complication ([place])   . Hypertension   . Migraine headache   . Reflux    Past Surgical History:  Past Surgical History:  Procedure Laterality Date  . CHOLECYSTECTOMY  before 1999  . iliocolotomy  1999  . TUBAL LIGATION  1991    Social History:  reports that she has never smoked. She has never used smokeless tobacco. She reports previous alcohol use. She reports previous drug use. Family History:  Family History  Problem Relation Age of Onset  . Heart disease Mother   . COPD Father   . Asthma Father   . Cancer Maternal Grandfather        colon and stomach     HOME MEDICATIONS: Allergies as of 08/06/2019      Reactions   Erythromycin Nausea And Vomiting   Nausea, vomiting      Medication List       Accurate as of August 06, 2019 10:26 AM. If you have any questions, ask your nurse or doctor.        atenolol 100 MG tablet Commonly known as: TENORMIN Take 1 tablet (100 mg total) by mouth daily.   atorvastatin 10 MG tablet Commonly known as: LIPITOR Take 1 tablet (10 mg total) by mouth daily.   blood glucose meter kit and supplies Dispense based on patient and insurance preference. Use  up to four times daily as directed. (FOR ICD-10 E10.9, E11.9).   ibuprofen 800 MG tablet Commonly known as: ADVIL TAKE 1 TABLET BY MOUTH EVERY 6 HOURS AS NEEDED   imipramine 25 MG tablet Commonly known as: TOFRANIL Take 4 tablets (100 mg total) by mouth at bedtime.   Insulin Pen Needle 31G X 5 MM Misc 1 Device by Does not apply route as directed.   Maxalt 10 MG tablet Generic drug: rizatriptan Take 10 mg by mouth daily as needed (for migraine pain).   metFORMIN 500 MG 24 hr tablet Commonly known as: Glucophage XR Take 2 tablets (1,000 mg total) by mouth 2 (two) times daily with a meal.   multivitamin tablet Take 1 tablet by mouth daily.   OVER THE COUNTER  MEDICATION Vitamin for nail strengthener with gelatin plus calcium and magnesium        OBJECTIVE:   Vital Signs: BP 114/78 (BP Location: Left Arm, Patient Position: Sitting, Cuff Size: Large)   Pulse 87   Ht '5\' 2"'  (1.575 m)   Wt 229 lb 6.4 oz (104.1 kg)   SpO2 99%   BMI 41.96 kg/m   Wt Readings from Last 3 Encounters:  08/06/19 229 lb 6.4 oz (104.1 kg)  07/14/19 231 lb (104.8 kg)  04/08/19 225 lb 12.8 oz (102.4 kg)     Exam: General: Pt appears well and is in NAD  Lungs: Clear with good BS bilat with no rales, rhonchi, or wheezes  Heart: RRR with normal S1 and S2 and no gallops; no murmurs; no rub  Abdomen: Normoactive bowel sounds, soft, nontender, without masses or organomegaly palpable  Extremities: No pretibial edema.   Neuro: MS is good with appropriate affect, pt is alert and Ox3    DM foot exam: 04/08/2019  The skin of the feet is intact without sores or ulcerations. The pedal pulses are 2+ on right and 2+ on left. The sensation is intact to a screening 5.07, 10 gram monofilament bilaterally    DATA REVIEWED:  Lab Results  Component Value Date   HGBA1C 6.6 (A) 08/06/2019   HGBA1C 5.9 (A) 04/08/2019   HGBA1C 6.0 (A) 12/31/2018   Lab Results  Component Value Date   MICROALBUR 4.1 (H) 04/08/2019   LDLCALC 113 (H) 06/03/2017   CREATININE 0.78 10/02/2018    Lab Results  Component Value Date   CHOL 179 04/08/2019   HDL 55.70 04/08/2019   LDLCALC 113 (H) 06/03/2017   LDLDIRECT 100.0 04/08/2019   TRIG 232.0 (H) 04/08/2019   CHOLHDL 3 04/08/2019      ASSESSMENT / PLAN / RECOMMENDATIONS:   1) Type 2 Diabetes Mellitus, Optimally controlled, Without complications - Most recent A1c of 6.6 %. Goal A1c < 7.0 %.     -A1c continues to be at goal -We did discuss the importance of having glucose data during her visits, patient tells me that her fasting BG's fluctuate sometimes and they tend to be higher on the nights that she takes M&Ms.  I have counseled the  patient on low-carb diet as well as maintaining a healthy lifestyle, she has come along way has been off MDI regimen and would like to keep it that way. -She has been counseled about limiting calories as well as exercising to prevent further weight gain, typically with stopping all insulin patients tend to lose weight, but in this instance the patient has been noted with weight gain which is either due to to increase calorie intake or decreased activity. -  No changes will be made today  MEDICATIONS:  -Continue Metformin 500 mg 2 tabs BID   EDUCATION / INSTRUCTIONS:  BG monitoring instructions: Patient is instructed to check her blood sugars 3 times a week, fasting   Call [facility] if: BG persistently < 70 or > 300. . I reviewed the Rule of 15 for the treatment of hypoglycemia in detail with the patient. Literature supplied.   2.Hypertriglyceridemia:   -Triglycerides are elevated, LDL is borderline.  Atorvastatin was started in 03/2019.  We will consider lipid panel on next visit.     F/U in 4 months    Signed electronically by: Mack Guise, MD  [facility]  [facility] [address] [place], [place] [address] Phone: [phone] FAX: [phone]   CC: Mikey Kirschner, [place] [address] Phone: [phone]  Fax: [phone]  Return to Endocrinology clinic as below: Future Appointments  Date Time Provider [place]  08/06/2019 10:30 AM Maveric Debono, Melanie Crazier, MD [id number] None

## 2019-08-06 NOTE — Patient Instructions (Signed)
-   Continue Metformin 500 mg, 2 tablets with Breakfast and supper   - Exercise by brisk walking 150 minutes per week

## 2019-10-18 ENCOUNTER — Encounter: Payer: Self-pay | Admitting: Family Medicine

## 2019-10-18 ENCOUNTER — Ambulatory Visit (INDEPENDENT_AMBULATORY_CARE_PROVIDER_SITE_OTHER): Payer: Self-pay | Admitting: Family Medicine

## 2019-10-18 ENCOUNTER — Telehealth: Payer: Self-pay | Admitting: Family Medicine

## 2019-10-18 VITALS — HR 85 | Temp 102.0°F | Resp 16

## 2019-10-18 DIAGNOSIS — B349 Viral infection, unspecified: Secondary | ICD-10-CM

## 2019-10-18 DIAGNOSIS — U071 COVID-19: Secondary | ICD-10-CM

## 2019-10-18 MED ORDER — BENZONATATE 100 MG PO CAPS: 100.0000 mg | ORAL_CAPSULE | Freq: Two times a day (BID) | ORAL | 0 refills | Status: DC | PRN

## 2019-10-18 NOTE — Progress Notes (Signed)
Patient ID: Jeanette Pierce, female    DOB: 08-02-1965, 54 y.o.   MRN: [id number]   Chief Complaint  Patient presents with  . Cough   Subjective:    Cough This is a new problem. Episode onset: Friday. Associated symptoms include chest pain, nasal congestion, postnasal drip and a sore throat. Pertinent negatives include no fever. Associated symptoms comments: nausea. Treatments tried: dayquil, nyquil, cold and flu. The treatment provided mild relief.  Patient states she woke up in the middle of the night feeling like her sugar had dropped- sweats, shaking but sugar 119.   Medical History Cortney has a past medical history of Diabetes mellitus without complication ([place]), Hypertension, Migraine headache, and Reflux.   Outpatient Encounter Medications as of 10/18/2019  Medication Sig  . atenolol (TENORMIN) 100 MG tablet Take 1 tablet (100 mg total) by mouth daily.  Marland Kitchen atorvastatin (LIPITOR) 10 MG tablet Take 1 tablet (10 mg total) by mouth daily.  . benzonatate (TESSALON) 100 MG capsule Take 1 capsule (100 mg total) by mouth 2 (two) times daily as needed for cough.  . blood glucose meter kit and supplies Dispense based on patient and insurance preference. Use up to four times daily as directed. (FOR ICD-10 E10.9, E11.9).  Marland Kitchen ibuprofen (ADVIL) 800 MG tablet TAKE 1 TABLET BY MOUTH EVERY 6 HOURS AS NEEDED  . imipramine (TOFRANIL) 25 MG tablet Take 4 tablets (100 mg total) by mouth at bedtime.  . metFORMIN (GLUCOPHAGE XR) 500 MG 24 hr tablet Take 2 tablets (1,000 mg total) by mouth 2 (two) times daily with a meal.  . Multiple Vitamin (MULTIVITAMIN) tablet Take 1 tablet by mouth daily.  Marland Kitchen OVER THE COUNTER MEDICATION Vitamin for nail strengthener with gelatin plus calcium and magnesium  . rizatriptan (MAXALT) 10 MG tablet Take 10 mg by mouth daily as needed (for migraine pain).    No facility-administered encounter medications on file as of 10/18/2019.     Review of Systems  Constitutional:  Negative for fever.  HENT: Positive for postnasal drip and sore throat.   Respiratory: Positive for cough.   Cardiovascular: Positive for chest pain.     Vitals Pulse 85   Temp (!) 102 F (38.9 C)   Resp 16   SpO2 95%   Objective:   Physical Exam HENT:     Right Ear: Tympanic membrane normal.     Left Ear: Tympanic membrane normal.     Nose: Congestion and rhinorrhea present.     Mouth/Throat:     Pharynx: Posterior oropharyngeal erythema present. No oropharyngeal exudate.  Cardiovascular:     Rate and Rhythm: Normal rate and regular rhythm.     Heart sounds: Normal heart sounds.  Pulmonary:     Effort: Pulmonary effort is normal.     Breath sounds: Normal breath sounds.     Comments: No acute distress. Not toxic appearing. Ill-appearing. Neurological:     Mental Status: She is alert and oriented to person, place, and time.  Psychiatric:        Behavior: Behavior normal.      Assessment and Plan   1. Acute viral syndrome - Novel Coronavirus, NAA ([facility]) - benzonatate (TESSALON) 100 MG capsule; Take 1 capsule (100 mg total) by mouth 2 (two) times daily as needed for cough.  Dispense: 20 capsule; Refill: 0    [name] presents today as an outside sick visit. She started having symptoms on Friday. She is Type 2 DM and is monitoring her blood  sugars regularly and reports they are not running too high.   I instructed Jeanette Pierce on her quarantine guidelines. She will get someone not in her household to go and get her medications for her and drop them on her porch to limit exposure to other healthy people at this time. She reports that her daughter is also experiencing symptoms at this time.  Jeanette Pierce did not get the COVID-19 vaccine. I did send a COVID-19 test to the lab today.  The office will notify her of the results once they become available.  If she should develop shortness of breath, she should seek care immediately.  I will send something to the pharmacy to  help control her cough, other than that she has been giving the instructions on how to manage a viral infection at home with conservative treatment.  She is instructed to follow-up with the office if she starts to feel sinus pain and pressure or if anything changes with her condition.  Your illness is likely caused by a virus. I recommend supportive therapy to help you to feel better.  1) Get lots of rest.  2) Take over the counter pain medication if needed, such as acetaminophen or ibuprofen. Read and follow instructions on the label and make sure not to combine other medications that may have same ingredients in it. It is important to not take too much of these ingredients.  3) Drink plenty of caffeine-free fluids. (If you have heart or kidney problems, follow the instructions of your specialist regarding amounts).  4) If you are hungry, eat a bland diet, such as the BRAT diet (bananas, rice, applesauce, toast).  5) Let Jeanette Pierce know if you are not feeling better in a week.  We will notify her with results of her Covid test once results are available. Follow up as needed.

## 2019-10-18 NOTE — Telephone Encounter (Signed)
Please advise. Thank you

## 2019-10-18 NOTE — Telephone Encounter (Signed)
Pt forgot to tell Jeanette Pierce that over the weekend that her bowl movements 95% water and body aches through out the weekend constant .   Pt [phone]

## 2019-10-19 NOTE — Telephone Encounter (Signed)
Tell her to stay hydrated and eat bland BRAT diet. If she feels like she is not staying "ahead" of the diarrhea she may need to go to the ED for fluids. She could try to take something for diarrhea to slow it down some.  Is she still able to urinate? What color is her urine? Thanks, Annali Lybrand

## 2019-10-19 NOTE — Telephone Encounter (Signed)
Lmtc

## 2019-10-19 NOTE — Telephone Encounter (Signed)
Patient notified. States she is urinating as normal and color depends on how much she is drinking but nothing abnormal. Advised that if experiencing worsening diarrhea and unable to stay on top of it to go to the ER.

## 2019-10-20 LAB — SARS-COV-2, NAA 2 DAY TAT

## 2019-10-20 LAB — NOVEL CORONAVIRUS, NAA: SARS-CoV-2, NAA: DETECTED — AB

## 2019-10-22 ENCOUNTER — Encounter: Payer: Self-pay | Admitting: Family Medicine

## 2019-10-22 ENCOUNTER — Encounter (HOSPITAL_COMMUNITY): Payer: Self-pay

## 2019-10-22 ENCOUNTER — Other Ambulatory Visit: Payer: Self-pay

## 2019-10-22 DIAGNOSIS — U071 COVID-19: Principal | ICD-10-CM | POA: Insufficient documentation

## 2019-10-22 DIAGNOSIS — E119 Type 2 diabetes mellitus without complications: Secondary | ICD-10-CM | POA: Diagnosis not present

## 2019-10-22 DIAGNOSIS — J1281 Pneumonia due to SARS-associated coronavirus: Secondary | ICD-10-CM | POA: Diagnosis not present

## 2019-10-22 DIAGNOSIS — Z79899 Other long term (current) drug therapy: Secondary | ICD-10-CM | POA: Insufficient documentation

## 2019-10-22 DIAGNOSIS — I10 Essential (primary) hypertension: Secondary | ICD-10-CM | POA: Insufficient documentation

## 2019-10-22 DIAGNOSIS — R509 Fever, unspecified: Secondary | ICD-10-CM | POA: Diagnosis present

## 2019-10-22 NOTE — ED Triage Notes (Signed)
Pt reports cough, fever, body aches, that started around one week ago. Pt tested positive for Covid. Pt reports diarrhea for one year.

## 2019-10-23 ENCOUNTER — Other Ambulatory Visit: Payer: Self-pay

## 2019-10-23 ENCOUNTER — Emergency Department (HOSPITAL_COMMUNITY): Payer: HRSA Program

## 2019-10-23 ENCOUNTER — Encounter (HOSPITAL_COMMUNITY): Payer: Self-pay | Admitting: Internal Medicine

## 2019-10-23 ENCOUNTER — Observation Stay (HOSPITAL_COMMUNITY)
Admission: EM | Admit: 2019-10-23 | Discharge: 2019-10-24 | Disposition: A | Discharge status: Home / Self Care | Payer: HRSA Program | Attending: Internal Medicine | Admitting: Internal Medicine

## 2019-10-23 DIAGNOSIS — J1282 Pneumonia due to coronavirus disease 2019: Secondary | ICD-10-CM | POA: Diagnosis not present

## 2019-10-23 DIAGNOSIS — U071 COVID-19: Secondary | ICD-10-CM | POA: Diagnosis not present

## 2019-10-23 LAB — FIBRINOGEN: Fibrinogen: 601 mg/dL — ABNORMAL HIGH (ref 210–475)

## 2019-10-23 LAB — CBC WITH DIFFERENTIAL/PLATELET
Abs Immature Granulocytes: 0.04 10*3/uL (ref 0.00–0.07)
Basophils Absolute: 0 10*3/uL (ref 0.0–0.1)
Basophils Relative: 1 %
Eosinophils Absolute: 0 10*3/uL (ref 0.0–0.5)
Eosinophils Relative: 0 %
HCT: 45.6 % (ref 36.0–46.0)
Hemoglobin: 14.8 g/dL (ref 12.0–15.0)
Immature Granulocytes: 1 %
Lymphocytes Relative: 39 %
Lymphs Abs: 1.8 10*3/uL (ref 0.7–4.0)
MCH: 27.5 pg (ref 26.0–34.0)
MCHC: 32.5 g/dL (ref 30.0–36.0)
MCV: 84.8 fL (ref 80.0–100.0)
Monocytes Absolute: 0.6 10*3/uL (ref 0.1–1.0)
Monocytes Relative: 12 %
Neutro Abs: 2.2 10*3/uL (ref 1.7–7.7)
Neutrophils Relative %: 47 %
Platelets: 193 10*3/uL (ref 150–400)
RBC: 5.38 MIL/uL — ABNORMAL HIGH (ref 3.87–5.11)
RDW: 15.2 % (ref 11.5–15.5)
WBC: 4.6 10*3/uL (ref 4.0–10.5)
nRBC: 0 % (ref 0.0–0.2)

## 2019-10-23 LAB — COMPREHENSIVE METABOLIC PANEL
ALT: 78 U/L — ABNORMAL HIGH (ref 0–44)
AST: 60 U/L — ABNORMAL HIGH (ref 15–41)
Albumin: 4 g/dL (ref 3.5–5.0)
Alkaline Phosphatase: 66 U/L (ref 38–126)
Anion gap: 15 (ref 5–15)
BUN: 16 mg/dL (ref 6–20)
CO2: 19 mmol/L — ABNORMAL LOW (ref 22–32)
Calcium: 8.5 mg/dL — ABNORMAL LOW (ref 8.9–10.3)
Chloride: 105 mmol/L (ref 98–111)
Creatinine, Ser: 0.91 mg/dL (ref 0.44–1.00)
GFR calc Af Amer: >60 mL/min (ref >60)
GFR calc non Af Amer: >60 mL/min (ref >60)
Glucose, Bld: 128 mg/dL — ABNORMAL HIGH (ref 70–99)
Potassium: 3.5 mmol/L (ref 3.5–5.1)
Sodium: 139 mmol/L (ref 135–145)
Total Bilirubin: 0.7 mg/dL (ref 0.3–1.2)
Total Protein: 7.6 g/dL (ref 6.5–8.1)

## 2019-10-23 LAB — HIV ANTIBODY (ROUTINE TESTING W REFLEX): HIV Screen 4th Generation wRfx: NONREACTIVE

## 2019-10-23 LAB — D-DIMER, QUANTITATIVE: D-Dimer, Quant: 0.9 ug/mL-FEU — ABNORMAL HIGH (ref 0.00–0.50)

## 2019-10-23 LAB — GLUCOSE, CAPILLARY
Glucose-Capillary: 237 mg/dL — ABNORMAL HIGH (ref 70–99)
Glucose-Capillary: 277 mg/dL — ABNORMAL HIGH (ref 70–99)

## 2019-10-23 LAB — TRIGLYCERIDES: Triglycerides: 116 mg/dL (ref <150)

## 2019-10-23 LAB — C-REACTIVE PROTEIN: CRP: 8.1 mg/dL — ABNORMAL HIGH (ref <1.0)

## 2019-10-23 LAB — LACTIC ACID, PLASMA
Lactic Acid, Venous: 2 mmol/L (ref 0.5–1.9)
Lactic Acid, Venous: 1.5 mmol/L (ref 0.5–1.9)

## 2019-10-23 LAB — PROCALCITONIN: Procalcitonin: <0.1 ng/mL

## 2019-10-23 LAB — ABO/RH: ABO/RH(D): A POS

## 2019-10-23 LAB — LACTATE DEHYDROGENASE: LDH: 247 U/L — ABNORMAL HIGH (ref 98–192)

## 2019-10-23 LAB — FERRITIN: Ferritin: 381 ng/mL — ABNORMAL HIGH (ref 11–307)

## 2019-10-23 MED ORDER — ACETAMINOPHEN 325 MG PO TABS
650.0000 mg | ORAL_TABLET | Freq: Once | ORAL | Status: AC
  Administered 2019-10-23: 650 mg via ORAL
  Filled 2019-10-23: qty 2

## 2019-10-23 MED ORDER — IMIPRAMINE HCL 25 MG PO TABS
100.0000 mg | ORAL_TABLET | Freq: Every day | ORAL | Status: DC
  Administered 2019-10-23: 100 mg via ORAL
  Filled 2019-10-23: qty 4

## 2019-10-23 MED ORDER — LINAGLIPTIN 5 MG PO TABS
5.0000 mg | ORAL_TABLET | Freq: Every day | ORAL | Status: DC
  Administered 2019-10-23 – 2019-10-24 (×2): 5 mg via ORAL
  Filled 2019-10-23 (×2): qty 1

## 2019-10-23 MED ORDER — SODIUM CHLORIDE 0.9 % IV BOLUS
1000.0000 mL | Freq: Once | INTRAVENOUS | Status: AC
  Administered 2019-10-23: 1000 mL via INTRAVENOUS

## 2019-10-23 MED ORDER — DEXAMETHASONE SODIUM PHOSPHATE 10 MG/ML IJ SOLN
10.0000 mg | Freq: Once | INTRAMUSCULAR | Status: AC
  Administered 2019-10-23: 10 mg via INTRAVENOUS
  Filled 2019-10-23: qty 1

## 2019-10-23 MED ORDER — SUMATRIPTAN SUCCINATE 50 MG PO TABS
50.0000 mg | ORAL_TABLET | ORAL | Status: DC | PRN
  Administered 2019-10-23: 50 mg via ORAL
  Filled 2019-10-23: qty 1

## 2019-10-23 MED ORDER — LABETALOL HCL 5 MG/ML IV SOLN
10.0000 mg | INTRAVENOUS | Status: DC | PRN
  Administered 2019-10-23: 10 mg via INTRAVENOUS
  Filled 2019-10-23: qty 4

## 2019-10-23 MED ORDER — ONDANSETRON HCL 4 MG/2ML IJ SOLN: 4.0000 mg | Freq: Four times a day (QID) | INTRAMUSCULAR | Status: DC | PRN

## 2019-10-23 MED ORDER — ATORVASTATIN CALCIUM 10 MG PO TABS
10.0000 mg | ORAL_TABLET | Freq: Every day | ORAL | Status: DC
  Administered 2019-10-23: 10 mg via ORAL
  Filled 2019-10-23: qty 1

## 2019-10-23 MED ORDER — DEXAMETHASONE SODIUM PHOSPHATE 10 MG/ML IJ SOLN
6.0000 mg | INTRAMUSCULAR | Status: DC
  Administered 2019-10-24: 6 mg via INTRAVENOUS
  Filled 2019-10-23: qty 1

## 2019-10-23 MED ORDER — LOPERAMIDE HCL 2 MG PO CAPS: 2.0000 mg | ORAL_CAPSULE | ORAL | Status: DC | PRN

## 2019-10-23 MED ORDER — BENZONATATE 100 MG PO CAPS
100.0000 mg | ORAL_CAPSULE | Freq: Two times a day (BID) | ORAL | Status: DC | PRN
  Administered 2019-10-23: 100 mg via ORAL
  Filled 2019-10-23: qty 1

## 2019-10-23 MED ORDER — ATENOLOL 25 MG PO TABS: 100.0000 mg | ORAL_TABLET | Freq: Every day | ORAL | Status: DC

## 2019-10-23 MED ORDER — IOHEXOL 350 MG/ML SOLN
100.0000 mL | Freq: Once | INTRAVENOUS | Status: AC | PRN
  Administered 2019-10-23: 100 mL via INTRAVENOUS

## 2019-10-23 MED ORDER — INSULIN DETEMIR 100 UNIT/ML ~~LOC~~ SOLN
0.1500 [IU]/kg | Freq: Two times a day (BID) | SUBCUTANEOUS | Status: DC
  Administered 2019-10-23 – 2019-10-24 (×2): 15 [IU] via SUBCUTANEOUS
  Filled 2019-10-23 (×4): qty 0.15

## 2019-10-23 MED ORDER — SODIUM CHLORIDE 0.9 % IV SOLN
100.0000 mg | Freq: Every day | INTRAVENOUS | Status: DC
  Administered 2019-10-24: 100 mg via INTRAVENOUS
  Filled 2019-10-23: qty 20

## 2019-10-23 MED ORDER — ATENOLOL 25 MG PO TABS
100.0000 mg | ORAL_TABLET | Freq: Every day | ORAL | Status: DC
  Administered 2019-10-23: 100 mg via ORAL
  Filled 2019-10-23: qty 4

## 2019-10-23 MED ORDER — ATORVASTATIN CALCIUM 10 MG PO TABS: 10.0000 mg | ORAL_TABLET | Freq: Every day | ORAL | Status: DC

## 2019-10-23 MED ORDER — ONDANSETRON HCL 4 MG/2ML IJ SOLN
4.0000 mg | Freq: Once | INTRAMUSCULAR | Status: AC
  Administered 2019-10-23: 4 mg via INTRAVENOUS
  Filled 2019-10-23: qty 2

## 2019-10-23 MED ORDER — HYDROCOD POLST-CPM POLST ER 10-8 MG/5ML PO SUER: 5.0000 mL | Freq: Two times a day (BID) | ORAL | Status: DC | PRN

## 2019-10-23 MED ORDER — ENOXAPARIN SODIUM 40 MG/0.4ML ~~LOC~~ SOLN
0.5000 mg/kg | Freq: Two times a day (BID) | SUBCUTANEOUS | Status: DC
  Administered 2019-10-23 – 2019-10-24 (×3): 35 mg via SUBCUTANEOUS
  Filled 2019-10-23 (×3): qty 0.4

## 2019-10-23 MED ORDER — ACETAMINOPHEN 325 MG PO TABS
650.0000 mg | ORAL_TABLET | Freq: Four times a day (QID) | ORAL | Status: DC | PRN
  Administered 2019-10-23: 650 mg via ORAL
  Filled 2019-10-23: qty 2

## 2019-10-23 MED ORDER — SODIUM CHLORIDE 0.9 % IV SOLN
100.0000 mg | INTRAVENOUS | Status: AC
  Administered 2019-10-23 (×2): 100 mg via INTRAVENOUS
  Filled 2019-10-23 (×2): qty 20

## 2019-10-23 MED ORDER — ALUM & MAG HYDROXIDE-SIMETH 200-200-20 MG/5ML PO SUSP
30.0000 mL | ORAL | Status: DC | PRN
  Administered 2019-10-23: 30 mL via ORAL
  Filled 2019-10-23: qty 30

## 2019-10-23 MED ORDER — INSULIN ASPART 100 UNIT/ML ~~LOC~~ SOLN
0.0000 [IU] | Freq: Three times a day (TID) | SUBCUTANEOUS | Status: DC
  Administered 2019-10-23: 5 [IU] via SUBCUTANEOUS
  Administered 2019-10-24: 11 [IU] via SUBCUTANEOUS
  Administered 2019-10-24: 2 [IU] via SUBCUTANEOUS

## 2019-10-23 MED ORDER — IBUPROFEN 800 MG PO TABS
800.0000 mg | ORAL_TABLET | Freq: Once | ORAL | Status: AC
  Administered 2019-10-23: 800 mg via ORAL
  Filled 2019-10-23: qty 1

## 2019-10-23 MED ORDER — GUAIFENESIN-DM 100-10 MG/5ML PO SYRP
10.0000 mL | ORAL_SOLUTION | ORAL | Status: DC | PRN
  Administered 2019-10-23: 10 mL via ORAL
  Filled 2019-10-23: qty 10

## 2019-10-23 MED ORDER — INSULIN ASPART 100 UNIT/ML ~~LOC~~ SOLN
0.0000 [IU] | Freq: Every day | SUBCUTANEOUS | Status: DC
  Administered 2019-10-23: 3 [IU] via SUBCUTANEOUS

## 2019-10-23 MED ORDER — ONDANSETRON HCL 4 MG PO TABS: 4.0000 mg | ORAL_TABLET | Freq: Four times a day (QID) | ORAL | Status: DC | PRN

## 2019-10-23 NOTE — Progress Notes (Signed)
Patient reports tingling to face. Patient states that she has experienced this several times in the past but "usually around her mouth." Patient does not appear diaphoretic. Patient states that in the last two weeks she has had these episodes she tried to tell her PCP but PCP was more "concerned about Covid."  Patient states when she gets cold that she experiences tingling. Per patient she had a chill in the room when she had tingling to her face. Patient also stated that sometimes before she needs to use the bathroom she feels "tingling" also. Modified stroke scale score 0. Vitals are as follows    10/23/19 1300  Vitals  Temp 98.6 F (37 C)  Temp Source Oral  BP (!) 152/102  BP Location Right Arm  BP Method Automatic  Patient Position (if appropriate) Lying  Pulse Rate (!) 119  Pulse Rate Source Dinamap  Resp 20  Level of Consciousness  Level of Consciousness Alert  MEWS COLOR  MEWS Score Color Yellow  Oxygen Therapy  SpO2 94 %  O2 Device Room Air  Patient Activity (if Appropriate) In bed  Pain Assessment  Pain Scale 0-10  Pain Score 0  MEWS Score  MEWS Temp 0  MEWS Systolic 0  MEWS Pulse 2  MEWS RR 0  MEWS LOC 0  MEWS Score 2   MD has been notified. Will continue to monitor patient.

## 2019-10-23 NOTE — H&P (Addendum)
History and Physical    JATARA HUETTNER WIO:[id number] DOB: 12-Dec-1965 DOA: 10/23/2019  PCP: Erven Colla, DO   Patient coming from: Home  Chief Complaint: SOB/fever/fatigue  HPI: Jeanette Pierce is a 54 y.o. female with medical history significant for obesity, type 2 diabetes, and migraine headaches who presented to the ED today with worsening shortness of breath as well as mild cough that is nonproductive.  She complains of some body aches as well as chills and fevers at home up to 102 Fahrenheit that have been worsening over the course of 1 week.  She denies any nausea or vomiting, but does have a chronic history of diarrhea which has worsened slightly during the course of this illness.  She states that she has not received her vaccination nor have her family members, all of whom are currently infected with Covid.   ED Course: Vital signs with some tachycardia noted as confirmed on EKG with sinus tachycardia.  Patient noted to be stable on room air with saturations in the 90th percentile, however she does have significant desaturations into the 80s percentile with talking and some movement.  Procalcitonin less than 0.1 and lactic acid 1.5 after noted to be 2 initially.  CTA with no sign of PE and some mild left lung base infiltrate noted.  She has been started on remdesivir as well as Decadron.  Review of Systems: All others reviewed as noted above and otherwise negative.  Past Medical History:  Diagnosis Date  . Diabetes mellitus without complication ([place])   . Hypertension   . Migraine headache   . Reflux     Past Surgical History:  Procedure Laterality Date  . CHOLECYSTECTOMY  before 1999  . iliocolotomy  1999  . TUBAL LIGATION  1991     reports that she has never smoked. She has never used smokeless tobacco. She reports previous alcohol use. She reports previous drug use.  Allergies  Allergen Reactions  . Erythromycin Nausea And Vomiting    Nausea, vomiting    Family  History  Problem Relation Age of Onset  . Heart disease Mother   . COPD Father   . Asthma Father   . Cancer Maternal Grandfather        colon and stomach    Prior to Admission medications   Medication Sig Start Date End Date Taking? Authorizing Provider  atenolol (TENORMIN) 100 MG tablet Take 1 tablet (100 mg total) by mouth daily. 07/14/19   Mikey Kirschner, MD  atorvastatin (LIPITOR) 10 MG tablet Take 1 tablet (10 mg total) by mouth daily. 04/16/19   Shamleffer, Melanie Crazier, MD  benzonatate (TESSALON) 100 MG capsule Take 1 capsule (100 mg total) by mouth 2 (two) times daily as needed for cough. 10/18/19   Chalmers Guest, NP  blood glucose meter kit and supplies Dispense based on patient and insurance preference. Use up to four times daily as directed. (FOR ICD-10 E10.9, E11.9). 10/02/18   Johnson, Clanford L, MD  ibuprofen (ADVIL) 800 MG tablet TAKE 1 TABLET BY MOUTH EVERY 6 HOURS AS NEEDED 10/29/18   Mikey Kirschner, MD  imipramine (TOFRANIL) 25 MG tablet Take 4 tablets (100 mg total) by mouth at bedtime. 07/14/19   Mikey Kirschner, MD  metFORMIN (GLUCOPHAGE XR) 500 MG 24 hr tablet Take 2 tablets (1,000 mg total) by mouth 2 (two) times daily with a meal. 04/26/19 07/14/19  Shamleffer, Melanie Crazier, MD  Multiple Vitamin (MULTIVITAMIN) tablet Take 1 tablet by mouth  daily.    [provider]  OVER THE COUNTER MEDICATION Vitamin for nail strengthener with gelatin plus calcium and magnesium    [provider]  rizatriptan (MAXALT) 10 MG tablet Take 10 mg by mouth daily as needed (for migraine pain).     [provider]    Physical Exam: Vitals:   10/23/19 0430 10/23/19 0815 10/23/19 1030 10/23/19 1200  BP: 126/61 118/90  137/89  Pulse: (!) 126 (!) 118 (!) 114 (!) 106  Resp: 19 18 (!) 22 20  Temp:  98.4 F (36.9 C)  98.6 F (37 C)  TempSrc:  Oral  Oral  SpO2: 95% 94% 94% 91%  Weight:      Height:        Constitutional: NAD, calm, comfortable,  obese Vitals:   10/23/19 0430 10/23/19 0815 10/23/19 1030 10/23/19 1200  BP: 126/61 118/90  137/89  Pulse: (!) 126 (!) 118 (!) 114 (!) 106  Resp: 19 18 (!) 22 20  Temp:  98.4 F (36.9 C)  98.6 F (37 C)  TempSrc:  Oral  Oral  SpO2: 95% 94% 94% 91%  Weight:      Height:       Eyes: lids and conjunctivae normal ENMT: Mucous membranes are moist.  Neck: normal, supple Respiratory: clear to auscultation bilaterally. Normal respiratory effort. No accessory muscle use. Currently on RA. Cardiovascular: Regular rate and rhythm, no murmurs. No extremity edema. Abdomen: no tenderness, no distention. Bowel sounds positive.  Musculoskeletal:  No joint deformity upper and lower extremities.   Skin: no rashes, lesions, ulcers.  Psychiatric: Normal judgment and insight. Alert and oriented x 3. Normal mood.   Labs on Admission: I have personally reviewed following labs and imaging studies  CBC: Recent Labs  Lab 10/23/19 0310  WBC 4.6  NEUTROABS 2.2  HGB 14.8  HCT 45.6  MCV 84.8  PLT 488   Basic Metabolic Panel: Recent Labs  Lab 10/23/19 0310  NA 139  K 3.5  CL 105  CO2 19*  GLUCOSE 128*  BUN 16  CREATININE 0.91  CALCIUM 8.5*   GFR: Estimated Creatinine Clearance: 79.8 mL/min (by C-G formula based on SCr of 0.91 mg/dL). Liver Function Tests: Recent Labs  Lab 10/23/19 0310  AST 60*  ALT 78*  ALKPHOS 66  BILITOT 0.7  PROT 7.6  ALBUMIN 4.0   No results for input(s): LIPASE, AMYLASE in the last 168 hours. No results for input(s): AMMONIA in the last 168 hours. Coagulation Profile: No results for input(s): INR, PROTIME in the last 168 hours. Cardiac Enzymes: No results for input(s): CKTOTAL, CKMB, CKMBINDEX, TROPONINI in the last 168 hours. BNP (last 3 results) No results for input(s): PROBNP in the last 8760 hours. HbA1C: No results for input(s): HGBA1C in the last 72 hours. CBG: No results for input(s): GLUCAP in the last 168 hours. Lipid Profile: Recent Labs     10/23/19 0311  TRIG 116   Thyroid Function Tests: No results for input(s): TSH, T4TOTAL, FREET4, T3FREE, THYROIDAB in the last 72 hours. Anemia Panel: Recent Labs    10/23/19 0310  FERRITIN 381*   Urine analysis:    Component Value Date/Time   COLORURINE STRAW (A) 09/30/2018 1256   APPEARANCEUR CLEAR 09/30/2018 1256   LABSPEC 1.025 09/30/2018 1256   PHURINE 5.0 09/30/2018 1256   GLUCOSEU >=500 (A) 09/30/2018 1256   HGBUR NEGATIVE 09/30/2018 1256   BILIRUBINUR NEGATIVE 09/30/2018 1256   KETONESUR 20 (A) 09/30/2018 1256   PROTEINUR NEGATIVE  09/30/2018 1256   NITRITE NEGATIVE 09/30/2018 [place] 09/30/2018 1256    Radiological Exams on Admission: CT Angio Chest PE W and/or Wo Contrast  Result Date: 10/23/2019 CLINICAL DATA:  Cough, COVID positive EXAM: CT ANGIOGRAPHY CHEST WITH CONTRAST TECHNIQUE: Multidetector CT imaging of the chest was performed using the standard protocol during bolus administration of intravenous contrast. Multiplanar CT image reconstructions and MIPs were obtained to evaluate the vascular anatomy. CONTRAST:  125m OMNIPAQUE IOHEXOL 350 MG/ML SOLN COMPARISON:  None. FINDINGS: Cardiovascular: Satisfactory opacification of the pulmonary arteries to the proximal segmental level. No evidence of pulmonary embolism. Normal heart size. No pericardial effusion. Mediastinum/Nodes: No mediastinal adenopathy. Mildly enlarged hilar nodes are likely reactive. Visualized thyroid is unremarkable. Esophagus is unremarkable. Lungs/Pleura: No pleural effusion or pneumothorax. Patchy consolidative and ground-glass opacities, greatest the lower lobes. Upper Abdomen: Hepatic steatosis. Cholecystectomy. No acute abnormality. Musculoskeletal: No chest wall abnormality. No acute or significant osseous findings. Review of the MIP images confirms the above findings. IMPRESSION: No evidence of acute pulmonary embolism. Pulmonary opacities probably reflecting COVID-19  pneumonia. Electronically Signed   By: PMacy MisM.D.   On: 10/23/2019 08:20   DG Chest Port 1 View  Result Date: 10/23/2019 CLINICAL DATA:  54year old female with cough.  Positive COVID-19. EXAM: PORTABLE CHEST 1 VIEW COMPARISON:  None. FINDINGS: Left lung base streaky and hazy airspace density as well as faint area of density in the left mid lung field consistent with infiltrate, likely viral or atypical in etiology and in keeping with COVID-19. Clinical correlation and follow-up recommended. There is no pleural effusion pneumothorax. The cardiac silhouette is within limits. No acute osseous pathology. IMPRESSION: Left lung base and left mid lung field infiltrates. Electronically Signed   By: AAnner CreteM.D.   On: 10/23/2019 03:40    EKG: Independently reviewed. ST 127bpm.  Assessment/Plan Active Problems:   Pneumonia due to COVID-19 virus    Pneumonia secondary to COVID-19 -Mild hypoxemia noted with movement and talking -Continue treatment with remdesivir as well as Decadron -Continue vitamin C and zinc -Antitussives and breathing treatments as needed -Mucinex for congestion  Type 2 diabetes -Carb modified diet -SSI while inpatient and avoid Metformin -Linagliptin -Recent Hemoglobin A1c 6.6%  History of migraine headaches -Continue on home medications to include atenolol -Continue imipramine and rizatriptan as ordered  Obesity -Lifestyle changes -Plan to keep on Lovenox twice daily given BMI greater than 35   DVT prophylaxis: Lovenox BID Code Status: Full Family Communication: None at bedside Disposition Plan:Admit for COVID treatment Consults called:None Admission status: Obs, tele   Toan Mort D Colt Martelle DO [facility]  If 7PM-7AM, please contact night-coverage [email or web address]  10/23/2019, 12:10 PM

## 2019-10-23 NOTE — ED Provider Notes (Signed)
  Physical Exam  BP 118/90   Pulse (!) 118   Temp 98.4 F (36.9 C) (Oral)   Resp 18   Ht 5\' 2"  (1.575 m)   Wt 97.5 kg   SpO2 94%   BMI 39.32 kg/m   Physical Exam  ED Course/Procedures     Procedures  MDM  Asked to reevaluate patient.  Reportedly Covid positive with worsening shortness of breath and diarrhea.  Previous hospitals have been discussed and reportedlyWanted CT scan and reevaluation.  Patient is still dyspneic to conversation.  On no oxygen but sats will go to 90% at least.  Still tachycardic.  No PE on pulmonary embolism CT scan.  Will admit to hospitalist.       , MD 10/23/19 1022

## 2019-10-23 NOTE — ED Notes (Signed)
Date and time results received: 10/23/19 0419  Test: Lactic Critical Value: 2.0  Name of Provider Notified: Rancour, MD  Orders Received? Or Actions Taken?: acknowledged

## 2019-10-23 NOTE — ED Provider Notes (Signed)
[facility] EMERGENCY DEPARTMENT Provider Note   CSN: [id number] Arrival date & time: 10/22/19  2158     History Chief Complaint  Patient presents with  . Fever    covid +    Jeanette Pierce is a 54 y.o. female.  Patient with a history of diabetes and hypertension, positive Covid test on August 23.  She is here with 1 week history of cough, body aches, chills, increasing shortness of breath with diarrhea.  States is feeling sore and achy all over with fevers at home up to 102.  Has had a dry nonproductive cough and nausea but no vomiting.  Frequent diarrhea which she states is a chronic issue for her and not significantly worse.  She feels short of breath and chest tightness with coughing.  Epigastric pain without vomiting but does have nausea.  Her diarrhea is unchanged. Reports multiple sick contacts at home.  Comes in with generalized weakness and feeling ill with coughing and shortness of breath and body aches and chills.  The history is provided by the patient.  Fever Associated symptoms: chest pain, chills, congestion, cough, diarrhea, headaches, myalgias, nausea and rhinorrhea   Associated symptoms: no dysuria, no rash and no vomiting        Past Medical History:  Diagnosis Date  . Diabetes mellitus without complication ([place])   . Hypertension   . Migraine headache   . Reflux     Patient Active Problem List   Diagnosis Date Noted  . Acute viral syndrome 10/18/2019  . Type 2 diabetes mellitus with hyperglycemia, without long-term current use of insulin ([place]) 10/28/2018  . Hypertension   . DKA (diabetic ketoacidoses) ([place]) 09/30/2018  . Type 2 diabetes mellitus without complication, without long-term current use of insulin ([place]) 05/30/2017  . Migraine headache 09/16/2012  . Esophageal reflux 09/16/2012    Past Surgical History:  Procedure Laterality Date  . CHOLECYSTECTOMY  before 1999  . iliocolotomy  1999  . TUBAL LIGATION  1991     OB History   No obstetric  history on file.     Family History  Problem Relation Age of Onset  . Heart disease Mother   . COPD Father   . Asthma Father   . Cancer Maternal Grandfather        colon and stomach    Social History   Tobacco Use  . Smoking status: Never Smoker  . Smokeless tobacco: Never Used  Substance Use Topics  . Alcohol use: Not Currently  . Drug use: Not Currently    Home Medications Prior to Admission medications   Medication Sig Start Date End Date Taking? Authorizing Provider  atenolol (TENORMIN) 100 MG tablet Take 1 tablet (100 mg total) by mouth daily. 07/14/19   Mikey Kirschner, MD  atorvastatin (LIPITOR) 10 MG tablet Take 1 tablet (10 mg total) by mouth daily. 04/16/19   Shamleffer, Melanie Crazier, MD  benzonatate (TESSALON) 100 MG capsule Take 1 capsule (100 mg total) by mouth 2 (two) times daily as needed for cough. 10/18/19   Chalmers Guest, NP  blood glucose meter kit and supplies Dispense based on patient and insurance preference. Use up to four times daily as directed. (FOR ICD-10 E10.9, E11.9). 10/02/18   Johnson, Clanford L, MD  ibuprofen (ADVIL) 800 MG tablet TAKE 1 TABLET BY MOUTH EVERY 6 HOURS AS NEEDED 10/29/18   Mikey Kirschner, MD  imipramine (TOFRANIL) 25 MG tablet Take 4 tablets (100 mg total) by mouth at  bedtime. 07/14/19   Mikey Kirschner, MD  metFORMIN (GLUCOPHAGE XR) 500 MG 24 hr tablet Take 2 tablets (1,000 mg total) by mouth 2 (two) times daily with a meal. 04/26/19 07/14/19  Shamleffer, Melanie Crazier, MD  Multiple Vitamin (MULTIVITAMIN) tablet Take 1 tablet by mouth daily.    [provider]  OVER THE COUNTER MEDICATION Vitamin for nail strengthener with gelatin plus calcium and magnesium    [provider]  rizatriptan (MAXALT) 10 MG tablet Take 10 mg by mouth daily as needed (for migraine pain).     [provider]    Allergies    Erythromycin  Review of Systems   Review of Systems  Constitutional: Positive for activity  change, appetite change, chills, fatigue and fever.  HENT: Positive for congestion and rhinorrhea.   Eyes: Negative for visual disturbance.  Respiratory: Positive for cough, chest tightness and shortness of breath.   Cardiovascular: Positive for chest pain.  Gastrointestinal: Positive for diarrhea and nausea. Negative for abdominal pain and vomiting.  Genitourinary: Negative for dysuria and hematuria.  Musculoskeletal: Positive for arthralgias and myalgias.  Skin: Negative for rash.  Neurological: Positive for weakness and headaches. Negative for dizziness and light-headedness.    all other systems are negative except as noted in the HPI and PMH.  Physical Exam Updated Vital Signs BP (!) 126/106 (BP Location: Left Arm)   Pulse (!) 127   Temp 99.3 F (37.4 C) (Oral)   Resp (!) 25   Ht '5\' 2"'  (1.575 m)   Wt 97.5 kg   SpO2 92%   BMI 39.32 kg/m   Physical Exam Vitals and nursing note reviewed.  Constitutional:      General: She is in acute distress.     Appearance: She is well-developed. She is ill-appearing.     Comments: Mild increased work of breathing, fatigued appearing  HENT:     Head: Normocephalic and atraumatic.     Mouth/Throat:     Mouth: Mucous membranes are dry.     Pharynx: No oropharyngeal exudate.  Eyes:     Conjunctiva/sclera: Conjunctivae normal.     Pupils: Pupils are equal, round, and reactive to light.  Neck:     Comments: No meningismus. Cardiovascular:     Rate and Rhythm: Regular rhythm. Tachycardia present.     Heart sounds: Normal heart sounds. No murmur heard.      Comments: Tachycardia 120 Pulmonary:     Effort: Pulmonary effort is normal. No respiratory distress.     Breath sounds: Normal breath sounds. No rhonchi.     Comments: Speaking in short phrases Abdominal:     Palpations: Abdomen is soft.     Tenderness: There is no abdominal tenderness. There is no guarding or rebound.  Musculoskeletal:        General: No tenderness. Normal range  of motion.     Cervical back: Normal range of motion and neck supple.  Skin:    General: Skin is warm.  Neurological:     Mental Status: She is alert and oriented to person, place, and time.     Cranial Nerves: No cranial nerve deficit.     Motor: No abnormal muscle tone.     Coordination: Coordination normal.     Comments:  5/5 strength throughout. CN 2-12 intact.Equal grip strength.   Psychiatric:        Behavior: Behavior normal.     ED Results / Procedures / Treatments   Labs (all labs ordered are  listed, but only abnormal results are displayed) Labs Reviewed  LACTIC ACID, PLASMA - Abnormal; Notable for the following components:      Result Value   Lactic Acid, Venous 2.0 (*)    All other components within normal limits  CBC WITH DIFFERENTIAL/PLATELET - Abnormal; Notable for the following components:   RBC 5.38 (*)    All other components within normal limits  COMPREHENSIVE METABOLIC PANEL - Abnormal; Notable for the following components:   CO2 19 (*)    Glucose, Bld 128 (*)    Calcium 8.5 (*)    AST 60 (*)    ALT 78 (*)    All other components within normal limits  D-DIMER, QUANTITATIVE (NOT AT [facility]) - Abnormal; Notable for the following components:   D-Dimer, Quant 0.90 (*)    All other components within normal limits  LACTATE DEHYDROGENASE - Abnormal; Notable for the following components:   LDH 247 (*)    All other components within normal limits  FERRITIN - Abnormal; Notable for the following components:   Ferritin 381 (*)    All other components within normal limits  FIBRINOGEN - Abnormal; Notable for the following components:   Fibrinogen 601 (*)    All other components within normal limits  C-REACTIVE PROTEIN - Abnormal; Notable for the following components:   CRP 8.1 (*)    All other components within normal limits  CULTURE, BLOOD (ROUTINE X 2)  CULTURE, BLOOD (ROUTINE X 2)  LACTIC ACID, PLASMA  PROCALCITONIN  TRIGLYCERIDES  POC URINE PREG, ED     EKG EKG Interpretation  Date/Time:  Saturday October 23 2019 01:59:59 EDT Ventricular Rate:  127 PR Interval:    QRS Duration: 93 QT Interval:  342 QTC Calculation: 498 R Axis:   37 Text Interpretation: Sinus tachycardia Borderline prolonged QT interval No significant change was found Confirmed by Ezequiel Essex [phone]) on 10/23/2019 2:11:14 AM   Radiology CT Angio Chest PE W and/or Wo Contrast  Result Date: 10/23/2019 CLINICAL DATA:  Cough, COVID positive EXAM: CT ANGIOGRAPHY CHEST WITH CONTRAST TECHNIQUE: Multidetector CT imaging of the chest was performed using the standard protocol during bolus administration of intravenous contrast. Multiplanar CT image reconstructions and MIPs were obtained to evaluate the vascular anatomy. CONTRAST:  176m OMNIPAQUE IOHEXOL 350 MG/ML SOLN COMPARISON:  None. FINDINGS: Cardiovascular: Satisfactory opacification of the pulmonary arteries to the proximal segmental level. No evidence of pulmonary embolism. Normal heart size. No pericardial effusion. Mediastinum/Nodes: No mediastinal adenopathy. Mildly enlarged hilar nodes are likely reactive. Visualized thyroid is unremarkable. Esophagus is unremarkable. Lungs/Pleura: No pleural effusion or pneumothorax. Patchy consolidative and ground-glass opacities, greatest the lower lobes. Upper Abdomen: Hepatic steatosis. Cholecystectomy. No acute abnormality. Musculoskeletal: No chest wall abnormality. No acute or significant osseous findings. Review of the MIP images confirms the above findings. IMPRESSION: No evidence of acute pulmonary embolism. Pulmonary opacities probably reflecting COVID-19 pneumonia. Electronically Signed   By: PMacy MisM.D.   On: 10/23/2019 08:20   DG Chest Port 1 View  Result Date: 10/23/2019 CLINICAL DATA:  54year old female with cough.  Positive COVID-19. EXAM: PORTABLE CHEST 1 VIEW COMPARISON:  None. FINDINGS: Left lung base streaky and hazy airspace density as well as faint area  of density in the left mid lung field consistent with infiltrate, likely viral or atypical in etiology and in keeping with COVID-19. Clinical correlation and follow-up recommended. There is no pleural effusion pneumothorax. The cardiac silhouette is within limits. No acute osseous pathology. IMPRESSION: Left lung base  and left mid lung field infiltrates. Electronically Signed   By: Anner Crete M.D.   On: 10/23/2019 03:40    Procedures Procedures (including critical care time)  Medications Ordered in ED Medications  remdesivir 100 mg in sodium chloride 0.9 % 100 mL IVPB (has no administration in time range)  sodium chloride 0.9 % bolus 1,000 mL (0 mLs Intravenous Stopped 10/23/19 0627)  ondansetron (ZOFRAN) injection 4 mg (4 mg Intravenous Given 10/23/19 0254)  dexamethasone (DECADRON) injection 10 mg (10 mg Intravenous Given 10/23/19 0451)  remdesivir 100 mg in sodium chloride 0.9 % 100 mL IVPB (0 mg Intravenous Stopped 10/23/19 0627)  iohexol (OMNIPAQUE) 350 MG/ML injection 100 mL (100 mLs Intravenous Contrast Given 10/23/19 0710)  ibuprofen (ADVIL) tablet 800 mg (800 mg Oral Given 10/23/19 0816)  acetaminophen (TYLENOL) tablet 650 mg (650 mg Oral Given 10/23/19 4287)    ED Course  I have reviewed the triage vital signs and the nursing notes.  Pertinent labs & imaging results that were available during my care of the patient were reviewed by me and considered in my medical decision making (see chart for details).    MDM Rules/Calculators/A&P                          Covid positive with increasing symptoms over the past several days.  She is tachycardic, short of breath tachypneic.  Judicious IV given. Labs and Xray obtained. Antipyretics.   CXR with L sided infiltrates.  Inflammatory markers mildly elevated.  With persistent tachycardia without fever, CTPE obtained to rule out pulmonary embolism.   This was negative for PE. Findings of covid pneumonia.  Remains tachycardic and  tachypneic. Not hyoxic on 2L O2. With increased work of breathing and persistent tachycardia, plan admission. Start IV decadron and remdesivir.   D/w Dr. Scherrie November.  Jeanette Pierce was evaluated in Emergency Department on 10/23/2019 for the symptoms described in the history of present illness. She was evaluated in the context of the global COVID-19 pandemic, which necessitated consideration that the patient might be at risk for infection with the SARS-CoV-2 virus that causes COVID-19. Institutional protocols and algorithms that pertain to the evaluation of patients at risk for COVID-19 are in a state of rapid change based on information released by regulatory bodies including the CDC and federal and state organizations. These policies and algorithms were followed during the patient's care in the ED.  Final Clinical Impression(s) / ED Diagnoses Final diagnoses:  Pneumonia due to COVID-19 virus    Rx / DC Orders ED Discharge Orders    None       Linnie Mcglocklin, Annie Main, MD 10/23/19 0900

## 2019-10-23 NOTE — Progress Notes (Signed)
   10/23/19 1300  Assess: MEWS Score  Temp 98.6 F (37 C)  BP (!) 152/102  Pulse Rate (!) 119  Resp 20  Level of Consciousness Alert  SpO2 94 %  O2 Device Room Air  Patient Activity (if Appropriate) In bed  Assess: MEWS Score  MEWS Temp 0  MEWS Systolic 0  MEWS Pulse 2  MEWS RR 0  MEWS LOC 0  MEWS Score 2  MEWS Score Color Yellow  Assess: if the MEWS score is Yellow or Red  Were vital signs taken at a resting state? Yes  Focused Assessment Change from prior assessment (see assessment flowsheet)  Early Detection of Sepsis Score *See Row Information* Low  MEWS guidelines implemented *See Row Information* Yes  Treat  MEWS Interventions Other (Comment) (MD informed)  Pain Scale 0-10  Pain Score 0  Take Vital Signs  Increase Vital Sign Frequency  Yellow: Q 2hr X 2 then Q 4hr X 2, if remains yellow, continue Q 4hrs  Escalate  MEWS: Escalate Yellow: discuss with charge nurse/RN and consider discussing with provider and RRT  Notify: Charge Nurse/RN  Name of Charge Nurse/RN Notified Westley Foots  Date Charge Nurse/RN Notified 10/23/19  Time Charge Nurse/RN Notified 1300  Notify: Provider  Provider Name/Title Dr. Sherryll Burger  Date Provider Notified 10/23/19  Time Provider Notified 1300  Notification Type Face-to-face  Notification Reason Change in status  Response See new orders  Date of Provider Response 10/23/19  Time of Provider Response 1300  Document  Patient Outcome Other (Comment) (patient stable. Tachycardia ongoing.)  Progress note created (see row info) Yes

## 2019-10-24 DIAGNOSIS — U071 COVID-19: Secondary | ICD-10-CM | POA: Diagnosis not present

## 2019-10-24 DIAGNOSIS — J1282 Pneumonia due to coronavirus disease 2019: Secondary | ICD-10-CM | POA: Diagnosis not present

## 2019-10-24 LAB — FERRITIN: Ferritin: 378 ng/mL — ABNORMAL HIGH (ref 11–307)

## 2019-10-24 LAB — COMPREHENSIVE METABOLIC PANEL
ALT: 70 U/L — ABNORMAL HIGH (ref 0–44)
AST: 44 U/L — ABNORMAL HIGH (ref 15–41)
Albumin: 3.9 g/dL (ref 3.5–5.0)
Alkaline Phosphatase: 59 U/L (ref 38–126)
Anion gap: 14 (ref 5–15)
BUN: 21 mg/dL — ABNORMAL HIGH (ref 6–20)
CO2: 22 mmol/L (ref 22–32)
Calcium: 9.3 mg/dL (ref 8.9–10.3)
Chloride: 105 mmol/L (ref 98–111)
Creatinine, Ser: 0.92 mg/dL (ref 0.44–1.00)
GFR calc Af Amer: >60 mL/min (ref >60)
GFR calc non Af Amer: >60 mL/min (ref >60)
Glucose, Bld: 154 mg/dL — ABNORMAL HIGH (ref 70–99)
Potassium: 4 mmol/L (ref 3.5–5.1)
Sodium: 141 mmol/L (ref 135–145)
Total Bilirubin: 0.5 mg/dL (ref 0.3–1.2)
Total Protein: 7.7 g/dL (ref 6.5–8.1)

## 2019-10-24 LAB — CBC WITH DIFFERENTIAL/PLATELET
Abs Immature Granulocytes: 0.1 10*3/uL — ABNORMAL HIGH (ref 0.00–0.07)
Basophils Absolute: 0 10*3/uL (ref 0.0–0.1)
Basophils Relative: 0 %
Eosinophils Absolute: 0 10*3/uL (ref 0.0–0.5)
Eosinophils Relative: 0 %
HCT: 43 % (ref 36.0–46.0)
Hemoglobin: 13.9 g/dL (ref 12.0–15.0)
Lymphocytes Relative: 15 %
Lymphs Abs: 1.3 10*3/uL (ref 0.7–4.0)
MCH: 27.8 pg (ref 26.0–34.0)
MCHC: 32.3 g/dL (ref 30.0–36.0)
MCV: 86 fL (ref 80.0–100.0)
Monocytes Absolute: 0.8 10*3/uL (ref 0.1–1.0)
Monocytes Relative: 9 %
Neutro Abs: 6.5 10*3/uL (ref 1.7–7.7)
Neutrophils Relative %: 76 %
Platelets: 305 10*3/uL (ref 150–400)
RBC: 5 MIL/uL (ref 3.87–5.11)
RDW: 15.5 % (ref 11.5–15.5)
WBC: 8.5 10*3/uL (ref 4.0–10.5)
nRBC: 0 % (ref 0.0–0.2)

## 2019-10-24 LAB — GLUCOSE, CAPILLARY
Glucose-Capillary: 138 mg/dL — ABNORMAL HIGH (ref 70–99)
Glucose-Capillary: 305 mg/dL — ABNORMAL HIGH (ref 70–99)

## 2019-10-24 LAB — C-REACTIVE PROTEIN: CRP: 3.5 mg/dL — ABNORMAL HIGH (ref <1.0)

## 2019-10-24 LAB — D-DIMER, QUANTITATIVE: D-Dimer, Quant: 0.63 ug/mL-FEU — ABNORMAL HIGH (ref 0.00–0.50)

## 2019-10-24 LAB — MAGNESIUM: Magnesium: 2.3 mg/dL (ref 1.7–2.4)

## 2019-10-24 MED ORDER — GUAIFENESIN-DM 100-10 MG/5ML PO SYRP: 10.0000 mL | ORAL_SOLUTION | ORAL | 0 refills | Status: DC | PRN

## 2019-10-24 MED ORDER — ALBUTEROL SULFATE HFA 108 (90 BASE) MCG/ACT IN AERS: 2.0000 | INHALATION_SPRAY | Freq: Four times a day (QID) | RESPIRATORY_TRACT | 2 refills | Status: DC | PRN

## 2019-10-24 MED ORDER — DEXAMETHASONE 6 MG PO TABS: 6.0000 mg | ORAL_TABLET | Freq: Every day | ORAL | 0 refills | Status: AC

## 2019-10-24 MED ORDER — LOPERAMIDE HCL 2 MG PO CAPS: 2.0000 mg | ORAL_CAPSULE | ORAL | 0 refills | Status: DC | PRN

## 2019-10-24 MED ORDER — VITAMIN C 250 MG PO TABS: 250.0000 mg | ORAL_TABLET | Freq: Every day | ORAL | 0 refills | Status: AC

## 2019-10-24 NOTE — Progress Notes (Signed)
The patient is scheduled for a Remdesivir infusion on 8/30, 8/31 and 9/2 at 3 pm. Have the patient come to [address] [facility], they will see a COVID infusion banner by the road.  Enter there and turn left. There are marked spaces for Infusion.  Call the number on the sign or [phone] and someone will come out and bring them inside.  If someone is driving them, have them come to the same area and call the number and someone will come outside to get them. Thank you!

## 2019-10-24 NOTE — Progress Notes (Signed)
Pt getting ready for discharge. Entered room to find pt bagging up trash in room, "I thought I'd try to help ya'll out a little bit." Pt back to bed for IV removal. Tolerated well. Pt denies any c/o SOB, resp even and nonlabored. Currently waiting on husband to bring her a change of clothes. Discharge instructions reviewed with patient, pt denies any questions or concerns.

## 2019-10-24 NOTE — Discharge Instructions (Signed)
You are scheduled for a Remdesivir infusion on 8/30, 8/31, and 9/1 at 3pm. Please come to [address] [facility], you will see a COVID infusion banner by the road.  Enter there and turn left.  There are marked spaces for Infusion. Call the number on the sign or [phone] and someone will come out and bring you inside. If someone is driving you please come to the same area and call the number and someone will come outside to get you. Thank you!

## 2019-10-24 NOTE — Discharge Summary (Signed)
Physician Discharge Summary  Jeanette Pierce WJX:[id number] DOB: [birth year] DOA: 10/23/2019  PCP: Erven Colla, DO  Admit date: 10/23/2019  Discharge date: 10/24/2019  Admitted From:Home  Disposition:  Home  Recommendations for Outpatient Follow-up:  1. Follow up with PCP in 1-2 weeks 2. Please obtain BMP/CBC in one week 3. Continue dexamethasone as prescribed 4. Continue on remdesivir as ordered in the outpatient setting for 3 more days 5. Continue on antitussives and breathing treatments and vitamins as ordered  Home Health: None  Equipment/Devices: None  Discharge Condition: Stable  CODE STATUS: Full  Diet recommendation: Heart Healthy/carb modified  Brief/Interim Summary: Per HPI: Jeanette Pierce is a 54 y.o. female with medical history significant for obesity, type 2 diabetes, and migraine headaches who presented to the ED today with worsening shortness of breath as well as mild cough that is nonproductive.  She complains of some body aches as well as chills and fevers at home up to 102 Fahrenheit that have been worsening over the course of 1 week.  She denies any nausea or vomiting, but does have a chronic history of diarrhea which has worsened slightly during the course of this illness.  She states that she has not received her vaccination nor have her family members, all of whom are currently infected with Covid.  8/29: Patient was admitted with COVID-19 pneumonia and mild hypoxemia with ambulation and talking.  She was started on treatment with remdesivir and Decadron and was observed overnight with improvement this morning.  She is overall stable for discharge and will continue treatments as ordered above.  Discharge Diagnoses:  Active Problems:   Pneumonia due to COVID-19 virus  Principal discharge diagnosis: COVID-19 pneumonia.  Discharge Instructions  Discharge Instructions    Diet - low sodium heart healthy   Complete by: As directed    Increase activity  slowly   Complete by: As directed      Allergies as of 10/24/2019      Reactions   Erythromycin Nausea And Vomiting   Nausea, vomiting      Medication List    TAKE these medications   albuterol 108 (90 Base) MCG/ACT inhaler Commonly known as: VENTOLIN HFA Inhale 2 puffs into the lungs every 6 (six) hours as needed for wheezing or shortness of breath.   atenolol 100 MG tablet Commonly known as: TENORMIN Take 1 tablet (100 mg total) by mouth daily.   atorvastatin 10 MG tablet Commonly known as: LIPITOR Take 1 tablet (10 mg total) by mouth daily.   benzonatate 100 MG capsule Commonly known as: TESSALON Take 1 capsule (100 mg total) by mouth 2 (two) times daily as needed for cough.   blood glucose meter kit and supplies Dispense based on patient and insurance preference. Use up to four times daily as directed. (FOR ICD-10 E10.9, E11.9). What changed: additional instructions   dexamethasone 6 MG tablet Commonly known as: DECADRON Take 1 tablet (6 mg total) by mouth daily for 8 days.   guaiFENesin-dextromethorphan 100-10 MG/5ML syrup Commonly known as: ROBITUSSIN DM Take 10 mLs by mouth every 4 (four) hours as needed for cough.   ibuprofen 800 MG tablet Commonly known as: ADVIL TAKE 1 TABLET BY MOUTH EVERY 6 HOURS AS NEEDED   imipramine 25 MG tablet Commonly known as: TOFRANIL Take 4 tablets (100 mg total) by mouth at bedtime.   loperamide 2 MG capsule Commonly known as: IMODIUM Take 1 capsule (2 mg total) by mouth as needed for diarrhea or loose  stools.   Maxalt 10 MG tablet Generic drug: rizatriptan Take 10 mg by mouth daily as needed (for migraine pain).   metFORMIN 500 MG 24 hr tablet Commonly known as: Glucophage XR Take 2 tablets (1,000 mg total) by mouth 2 (two) times daily with a meal.   multivitamin tablet Take 1 tablet by mouth daily.   vitamin C 250 MG tablet Commonly known as: ASCORBIC ACID Take 1 tablet (250 mg total) by mouth daily.        Follow-up Information    Elvia Collum M, DO Follow up in 1 week(s).   Specialty: Family Medicine Contact information: [place] [address] [phone]              Allergies  Allergen Reactions  . Erythromycin Nausea And Vomiting    Nausea, vomiting    Consultations:  None   Procedures/Studies: CT Angio Chest PE W and/or Wo Contrast  Result Date: 10/23/2019 CLINICAL DATA:  Cough, COVID positive EXAM: CT ANGIOGRAPHY CHEST WITH CONTRAST TECHNIQUE: Multidetector CT imaging of the chest was performed using the standard protocol during bolus administration of intravenous contrast. Multiplanar CT image reconstructions and MIPs were obtained to evaluate the vascular anatomy. CONTRAST:  133m OMNIPAQUE IOHEXOL 350 MG/ML SOLN COMPARISON:  None. FINDINGS: Cardiovascular: Satisfactory opacification of the pulmonary arteries to the proximal segmental level. No evidence of pulmonary embolism. Normal heart size. No pericardial effusion. Mediastinum/Nodes: No mediastinal adenopathy. Mildly enlarged hilar nodes are likely reactive. Visualized thyroid is unremarkable. Esophagus is unremarkable. Lungs/Pleura: No pleural effusion or pneumothorax. Patchy consolidative and ground-glass opacities, greatest the lower lobes. Upper Abdomen: Hepatic steatosis. Cholecystectomy. No acute abnormality. Musculoskeletal: No chest wall abnormality. No acute or significant osseous findings. Review of the MIP images confirms the above findings. IMPRESSION: No evidence of acute pulmonary embolism. Pulmonary opacities probably reflecting COVID-19 pneumonia. Electronically Signed   By: PMacy MisM.D.   On: 10/23/2019 08:20   DG Chest Port 1 View  Result Date: 10/23/2019 CLINICAL DATA:  54year old female with cough.  Positive COVID-19. EXAM: PORTABLE CHEST 1 VIEW COMPARISON:  None. FINDINGS: Left lung base streaky and hazy airspace density as well as faint area of density in the left mid lung field  consistent with infiltrate, likely viral or atypical in etiology and in keeping with COVID-19. Clinical correlation and follow-up recommended. There is no pleural effusion pneumothorax. The cardiac silhouette is within limits. No acute osseous pathology. IMPRESSION: Left lung base and left mid lung field infiltrates. Electronically Signed   By: AAnner CreteM.D.   On: 10/23/2019 03:40    Discharge Exam: Vitals:   10/23/19 2134 10/24/19 0557  BP: 119/80 98/64  Pulse: 92 67  Resp: 16 16  Temp: 98.4 F (36.9 C) 98.2 F (36.8 C)  SpO2: 93% 94%   Vitals:   10/23/19 1800 10/23/19 1815 10/23/19 2134 10/24/19 0557  BP: 136/82  119/80 98/64  Pulse: (!) 115  92 67  Resp:   16 16  Temp: 98 F (36.7 C)  98.4 F (36.9 C) 98.2 F (36.8 C)  TempSrc: Oral     SpO2: 91% 96% 93% 94%  Weight:      Height:        General: Pt is alert, awake, not in acute distress, obese Cardiovascular: RRR, S1/S2 +, no rubs, no gallops Respiratory: CTA bilaterally, no wheezing, no rhonchi Abdominal: Soft, NT, ND, bowel sounds + Extremities: no edema, no cyanosis    The results of significant diagnostics from  this hospitalization (including imaging, microbiology, ancillary and laboratory) are listed below for reference.     Microbiology: Recent Results (from the past 240 hour(s))  Novel Coronavirus, NAA ([facility])     Status: Abnormal   Collection Time: 10/18/19 12:00 AM   Specimen: Nasopharyngeal(NP) swabs in vial transport medium   Nasopharynge  Is this  Result Value Ref Range Status   SARS-CoV-2, NAA Detected (A) Not Detected Final    Comment: Patients who have a positive COVID-19 test result may now have treatment options. Treatment options are available for patients with mild to moderate symptoms and for hospitalized patients. Visit our website at [email or web address] for resources and information. This nucleic acid amplification test was developed and its  performance characteristics determined by [employer]. Nucleic acid amplification tests include RT-PCR and TMA. This test has not been FDA cleared or approved. This test has been authorized by FDA under an Emergency Use Authorization (EUA). This test is only authorized for the duration of time the declaration that circumstances exist justifying the authorization of the emergency use of in vitro diagnostic tests for detection of SARS-CoV-2 virus and/or diagnosis of COVID-19 infection under section 564(b)(1) of the Act, 21 U.S.C. 196QIW-9(N) (1), unless the authorization is terminated or revoked sooner. When diagnostic testing is negativ e, the possibility of a false negative result should be considered in the context of a patient's recent exposures and the presence of clinical signs and symptoms consistent with COVID-19. An individual without symptoms of COVID-19 and who is not shedding SARS-CoV-2 virus would expect to have a negative (not detected) result in this assay.   SARS-COV-2, NAA 2 DAY TAT     Status: None   Collection Time: 10/18/19 12:00 AM   Nasopharynge  Is this  Result Value Ref Range Status   SARS-CoV-2, NAA 2 DAY TAT Performed  Final  Blood Culture (routine x 2)     Status: None (Preliminary result)   Collection Time: 10/23/19  2:50 AM   Specimen: BLOOD  Result Value Ref Range Status   Specimen Description BLOOD RIGHT ANTECUBITAL  Final   Special Requests   Final    BOTTLES DRAWN AEROBIC AND ANAEROBIC Blood Culture adequate volume   Culture   Final    NO GROWTH 1 DAY Performed at [facility], [address] [place], [place] [address]    Report Status PENDING  Incomplete  Blood Culture (routine x 2)     Status: None (Preliminary result)   Collection Time: 10/23/19  3:11 AM   Specimen: BLOOD  Result Value Ref Range Status   Specimen Description BLOOD LEFT ANTECUBITAL  Final   Special Requests   Final    BOTTLES DRAWN AEROBIC AND ANAEROBIC Blood Culture  adequate volume   Culture   Final    NO GROWTH 1 DAY Performed at [facility], [address], [place], [place] [address]    Report Status PENDING  Incomplete     Labs: BNP (last 3 results) No results for input(s): BNP in the last 8760 hours. Basic Metabolic Panel: Recent Labs  Lab 10/23/19 0310 10/24/19 0758  NA 139 141  K 3.5 4.0  CL 105 105  CO2 19* 22  GLUCOSE 128* 154*  BUN 16 21*  CREATININE 0.91 0.92  CALCIUM 8.5* 9.3  MG  --  2.3   Liver Function Tests: Recent Labs  Lab 10/23/19 0310 10/24/19 0758  AST 60* 44*  ALT 78* 70*  ALKPHOS 66 59  BILITOT 0.7 0.5  PROT 7.6 7.7  ALBUMIN 4.0 3.9   No results for input(s): LIPASE, AMYLASE in the last 168 hours. No results for input(s): AMMONIA in the last 168 hours. CBC: Recent Labs  Lab 10/23/19 0310 10/24/19 0758  WBC 4.6 8.5  NEUTROABS 2.2 6.5  HGB 14.8 13.9  HCT 45.6 43.0  MCV 84.8 86.0  PLT 193 305   Cardiac Enzymes: No results for input(s): CKTOTAL, CKMB, CKMBINDEX, TROPONINI in the last 168 hours. BNP: Invalid input(s): POCBNP CBG: Recent Labs  Lab 10/23/19 1555 10/23/19 2126 10/24/19 0744  GLUCAP 237* 277* 138*   D-Dimer Recent Labs    10/23/19 0310 10/24/19 0758  DDIMER 0.90* 0.63*   Hgb A1c No results for input(s): HGBA1C in the last 72 hours. Lipid Profile Recent Labs    10/23/19 0311  TRIG 116   Thyroid function studies No results for input(s): TSH, T4TOTAL, T3FREE, THYROIDAB in the last 72 hours.  Invalid input(s): FREET3 Anemia work up Recent Labs    10/23/19 0310 10/24/19 0758  FERRITIN 381* 378*   Urinalysis    Component Value Date/Time   COLORURINE STRAW (A) 09/30/2018 1256   APPEARANCEUR CLEAR 09/30/2018 1256   LABSPEC 1.025 09/30/2018 1256   PHURINE 5.0 09/30/2018 1256   GLUCOSEU >=500 (A) 09/30/2018 1256   HGBUR NEGATIVE 09/30/2018 1256   BILIRUBINUR NEGATIVE 09/30/2018 1256   KETONESUR 20 (A) 09/30/2018 1256   PROTEINUR NEGATIVE 09/30/2018 1256    NITRITE NEGATIVE 09/30/2018 1256   LEUKOCYTESUR NEGATIVE 09/30/2018 1256   Sepsis Labs Invalid input(s): PROCALCITONIN,  WBC,  LACTICIDVEN Microbiology Recent Results (from the past 240 hour(s))  Novel Coronavirus, NAA ([facility])     Status: Abnormal   Collection Time: 10/18/19 12:00 AM   Specimen: Nasopharyngeal(NP) swabs in vial transport medium   Nasopharynge  Is this  Result Value Ref Range Status   SARS-CoV-2, NAA Detected (A) Not Detected Final    Comment: Patients who have a positive COVID-19 test result may now have treatment options. Treatment options are available for patients with mild to moderate symptoms and for hospitalized patients. Visit our website at [email or web address] for resources and information. This nucleic acid amplification test was developed and its performance characteristics determined by [employer]. Nucleic acid amplification tests include RT-PCR and TMA. This test has not been FDA cleared or approved. This test has been authorized by FDA under an Emergency Use Authorization (EUA). This test is only authorized for the duration of time the declaration that circumstances exist justifying the authorization of the emergency use of in vitro diagnostic tests for detection of SARS-CoV-2 virus and/or diagnosis of COVID-19 infection under section 564(b)(1) of the Act, 21 U.S.C. 381WEX-9(B) (1), unless the authorization is terminated or revoked sooner. When diagnostic testing is negativ e, the possibility of a false negative result should be considered in the context of a patient's recent exposures and the presence of clinical signs and symptoms consistent with COVID-19. An individual without symptoms of COVID-19 and who is not shedding SARS-CoV-2 virus would expect to have a negative (not detected) result in this assay.   SARS-COV-2, NAA 2 DAY TAT     Status: None   Collection Time: 10/18/19 12:00 AM   Nasopharynge  Is this  Result  Value Ref Range Status   SARS-CoV-2, NAA 2 DAY TAT Performed  Final  Blood Culture (routine x 2)     Status: None (Preliminary result)   Collection Time: 10/23/19  2:50 AM   Specimen: BLOOD  Result Value Ref  Range Status   Specimen Description BLOOD RIGHT ANTECUBITAL  Final   Special Requests   Final    BOTTLES DRAWN AEROBIC AND ANAEROBIC Blood Culture adequate volume   Culture   Final    NO GROWTH 1 DAY Performed at [facility], [address] [place], [place] [address]    Report Status PENDING  Incomplete  Blood Culture (routine x 2)     Status: None (Preliminary result)   Collection Time: 10/23/19  3:11 AM   Specimen: BLOOD  Result Value Ref Range Status   Specimen Description BLOOD LEFT ANTECUBITAL  Final   Special Requests   Final    BOTTLES DRAWN AEROBIC AND ANAEROBIC Blood Culture adequate volume   Culture   Final    NO GROWTH 1 DAY Performed at [facility], [address], [place], [place] [address]    Report Status PENDING  Incomplete     Time coordinating discharge: 35 minutes  SIGNED:   Rodena Goldmann, DO [facility] 10/24/2019, 10:05 AM  If 7PM-7AM, please contact night-coverage [email or web address]

## 2019-10-25 ENCOUNTER — Ambulatory Visit (HOSPITAL_COMMUNITY)
Admit: 2019-10-25 | Discharge: 2019-10-25 | Disposition: A | Discharge status: Home / Self Care | Payer: HRSA Program | Attending: Pulmonary Disease | Admitting: Pulmonary Disease

## 2019-10-25 DIAGNOSIS — U071 COVID-19: Secondary | ICD-10-CM | POA: Diagnosis not present

## 2019-10-25 MED ORDER — METHYLPREDNISOLONE SODIUM SUCC 125 MG IJ SOLR: 125.0000 mg | Freq: Once | INTRAMUSCULAR | Status: DC | PRN

## 2019-10-25 MED ORDER — EPINEPHRINE 0.3 MG/0.3ML IJ SOAJ: 0.3000 mg | Freq: Once | INTRAMUSCULAR | Status: DC | PRN

## 2019-10-25 MED ORDER — FAMOTIDINE IN NACL 20-0.9 MG/50ML-% IV SOLN: 20.0000 mg | Freq: Once | INTRAVENOUS | Status: DC | PRN

## 2019-10-25 MED ORDER — SODIUM CHLORIDE 0.9 % IV SOLN
100.0000 mg | Freq: Once | INTRAVENOUS | Status: AC
  Administered 2019-10-25: 100 mg via INTRAVENOUS

## 2019-10-25 MED ORDER — DIPHENHYDRAMINE HCL 50 MG/ML IJ SOLN: 50.0000 mg | Freq: Once | INTRAMUSCULAR | Status: DC | PRN

## 2019-10-25 MED ORDER — SODIUM CHLORIDE 0.9 % IV SOLN: INTRAVENOUS | Status: DC | PRN

## 2019-10-25 MED ORDER — ALBUTEROL SULFATE HFA 108 (90 BASE) MCG/ACT IN AERS: 2.0000 | INHALATION_SPRAY | Freq: Once | RESPIRATORY_TRACT | Status: DC | PRN

## 2019-10-25 NOTE — Progress Notes (Signed)
  Diagnosis: COVID-19  Physician: Dr. Wright  Procedure: [facility] Infusion Clinic Med: remdesivir infusion - Provided patient with remdesivir fact sheet for patients, parents and caregivers prior to infusion.  Complications: No immediate complications noted.  Discharge: Discharged home   Roxi Hlavaty R Pearle Wandler 10/25/2019   

## 2019-10-25 NOTE — Discharge Instructions (Signed)

## 2019-10-26 ENCOUNTER — Ambulatory Visit (HOSPITAL_COMMUNITY)
Admit: 2019-10-26 | Discharge: 2019-10-26 | Disposition: A | Discharge status: Home / Self Care | Payer: HRSA Program | Attending: Pulmonary Disease | Admitting: Pulmonary Disease

## 2019-10-26 DIAGNOSIS — U071 COVID-19: Secondary | ICD-10-CM | POA: Diagnosis not present

## 2019-10-26 MED ORDER — SODIUM CHLORIDE 0.9 % IV SOLN
100.0000 mg | Freq: Once | INTRAVENOUS | Status: AC
  Administered 2019-10-26: 100 mg via INTRAVENOUS

## 2019-10-26 MED ORDER — EPINEPHRINE 0.3 MG/0.3ML IJ SOAJ: 0.3000 mg | Freq: Once | INTRAMUSCULAR | Status: DC | PRN

## 2019-10-26 MED ORDER — DIPHENHYDRAMINE HCL 50 MG/ML IJ SOLN: 50.0000 mg | Freq: Once | INTRAMUSCULAR | Status: DC | PRN

## 2019-10-26 MED ORDER — SODIUM CHLORIDE 0.9 % IV SOLN: INTRAVENOUS | Status: DC | PRN

## 2019-10-26 MED ORDER — METHYLPREDNISOLONE SODIUM SUCC 125 MG IJ SOLR: 125.0000 mg | Freq: Once | INTRAMUSCULAR | Status: DC | PRN

## 2019-10-26 MED ORDER — ALBUTEROL SULFATE HFA 108 (90 BASE) MCG/ACT IN AERS: 2.0000 | INHALATION_SPRAY | Freq: Once | RESPIRATORY_TRACT | Status: DC | PRN

## 2019-10-26 MED ORDER — FAMOTIDINE IN NACL 20-0.9 MG/50ML-% IV SOLN: 20.0000 mg | Freq: Once | INTRAVENOUS | Status: DC | PRN

## 2019-10-26 NOTE — Discharge Instructions (Signed)

## 2019-10-26 NOTE — Progress Notes (Signed)
  Diagnosis: COVID-19  Physician:Dr. Patrick Wright  Procedure: [facility] Infusion Clinic Med: remdesivir infusion - Provided patient with remdesivir fact sheet for patients, parents and caregivers prior to infusion.  Complications: No immediate complications noted.  Discharge: Discharged home   Jeanette Pierce 10/26/2019   

## 2019-10-27 ENCOUNTER — Ambulatory Visit (HOSPITAL_COMMUNITY)
Admit: 2019-10-27 | Discharge: 2019-10-27 | Disposition: A | Discharge status: Home / Self Care | Payer: HRSA Program | Attending: Pulmonary Disease | Admitting: Pulmonary Disease

## 2019-10-27 DIAGNOSIS — U071 COVID-19: Secondary | ICD-10-CM | POA: Diagnosis not present

## 2019-10-27 DIAGNOSIS — J1282 Pneumonia due to coronavirus disease 2019: Secondary | ICD-10-CM | POA: Diagnosis not present

## 2019-10-27 MED ORDER — SODIUM CHLORIDE 0.9 % IV SOLN: INTRAVENOUS | Status: DC | PRN

## 2019-10-27 MED ORDER — EPINEPHRINE 0.3 MG/0.3ML IJ SOAJ: 0.3000 mg | Freq: Once | INTRAMUSCULAR | Status: DC | PRN

## 2019-10-27 MED ORDER — SODIUM CHLORIDE 0.9 % IV SOLN
100.0000 mg | Freq: Once | INTRAVENOUS | Status: AC
  Administered 2019-10-27: 100 mg via INTRAVENOUS

## 2019-10-27 MED ORDER — FAMOTIDINE IN NACL 20-0.9 MG/50ML-% IV SOLN: 20.0000 mg | Freq: Once | INTRAVENOUS | Status: DC | PRN

## 2019-10-27 MED ORDER — DIPHENHYDRAMINE HCL 50 MG/ML IJ SOLN: 50.0000 mg | Freq: Once | INTRAMUSCULAR | Status: DC | PRN

## 2019-10-27 MED ORDER — METHYLPREDNISOLONE SODIUM SUCC 125 MG IJ SOLR: 125.0000 mg | Freq: Once | INTRAMUSCULAR | Status: DC | PRN

## 2019-10-27 MED ORDER — ALBUTEROL SULFATE HFA 108 (90 BASE) MCG/ACT IN AERS: 2.0000 | INHALATION_SPRAY | Freq: Once | RESPIRATORY_TRACT | Status: DC | PRN

## 2019-10-27 NOTE — Discharge Instructions (Signed)

## 2019-10-28 LAB — CULTURE, BLOOD (ROUTINE X 2)
Culture: NO GROWTH
Culture: NO GROWTH
Special Requests: ADEQUATE
Special Requests: ADEQUATE

## 2019-11-02 ENCOUNTER — Telehealth: Payer: Self-pay | Admitting: *Deleted

## 2019-11-02 ENCOUNTER — Other Ambulatory Visit: Payer: Self-pay

## 2019-11-02 ENCOUNTER — Telehealth (INDEPENDENT_AMBULATORY_CARE_PROVIDER_SITE_OTHER): Payer: Self-pay | Admitting: Family Medicine

## 2019-11-02 DIAGNOSIS — Z794 Long term (current) use of insulin: Secondary | ICD-10-CM

## 2019-11-02 DIAGNOSIS — U071 COVID-19: Secondary | ICD-10-CM

## 2019-11-02 DIAGNOSIS — E1165 Type 2 diabetes mellitus with hyperglycemia: Secondary | ICD-10-CM

## 2019-11-02 NOTE — Progress Notes (Addendum)
   Subjective:    Patient ID: Jeanette Pierce, female    DOB: [birth year], 54 y.o.   MRN: [id number]  HPI  Patient calls for a follow up on recent hospitalization for Covid. Patient states she is feeling better. Patient still gets severe fatigue with activity. Patient with significant fatigue tiredness feeling rundown.  Was in the hospital with Covid.  She is starting to gradually feel better not having any severe shortness of breath. Virtual Visit via Video Note  I connected with Ziyah H Sinn on 11/02/19 at 11:30 AM EDT by a video enabled telemedicine application and verified that I am speaking with the correct person using two identifiers.  Location: Patient: home Provider: office   I discussed the limitations of evaluation and management by telemedicine and the availability of in person appointments. The patient expressed understanding and agreed to proceed. 20 minutes spent with patient regarding these measures History of Present Illness:    Observations/Objective:   Assessment and Plan:   Follow Up Instructions:    I discussed the assessment and treatment plan with the patient. The patient was provided an opportunity to ask questions and all were answered. The patient agreed with the plan and demonstrated an understanding of the instructions.   The patient was advised to call back or seek an in-person evaluation if the symptoms worsen or if the condition fails to improve as anticipated.  I provided 20 minutes of non-face-to-face time during this encounter.  Including documentation      Review of Systems    Does relate elevated glucoses.  Denies any severe shortness of breath.  Followed by endocrinology in [facility] Objective:   Physical Exam    Today's visit was via telephone Physical exam was not possible for this visit     Assessment & Plan:  Recent severe Covid infection Hospitalized Starting to improve We will take her some time to get back to her  baseline Warning signs were discussed If she is not dramatically better within the next 7 days to follow-up for an in person visit  Your sugars are elevated because of her type 2 diabetes but she is scheduled to see endocrinology in October because of her sugars being elevated and she is already on Metformin I authorized her to restart Lantus at 60 units per evening she will monitor her sugars and show them to the endocrinologist later next week

## 2019-11-02 NOTE — Telephone Encounter (Signed)
[name], Jeanette Pierce are scheduled for a virtual visit with your provider today.    Just as we do with appointments in the office, we must obtain your consent to participate.  Your consent will be active for this visit and any virtual visit you may have with one of our providers in the next 365 days.    If you have a MyChart account, I can also send a copy of this consent to you electronically.  All virtual visits are billed to your insurance company just like a traditional visit in the office.  As this is a virtual visit, video technology does not allow for your provider to perform a traditional examination.  This may limit your provider's ability to fully assess your condition.  If your provider identifies any concerns that need to be evaluated in person or the need to arrange testing such as labs, EKG, etc, we will make arrangements to do so.    Although advances in technology are sophisticated, we cannot ensure that it will always work on either your end or our end.  If the connection with a video visit is poor, we may have to switch to a telephone visit.  With either a video or telephone visit, we are not always able to ensure that we have a secure connection.   I need to obtain your verbal consent now.   Are you willing to proceed with your visit today?   Jeanette Pierce has provided verbal consent on 11/02/2019 for a virtual visit (video or telephone).   Kathleen Lime, RN 11/02/2019  9:05 AM

## 2019-12-05 ENCOUNTER — Other Ambulatory Visit: Payer: Self-pay | Admitting: Internal Medicine

## 2019-12-10 ENCOUNTER — Ambulatory Visit: Payer: Self-pay | Admitting: Internal Medicine

## 2019-12-10 ENCOUNTER — Encounter: Payer: Self-pay | Admitting: Internal Medicine

## 2019-12-10 ENCOUNTER — Ambulatory Visit (INDEPENDENT_AMBULATORY_CARE_PROVIDER_SITE_OTHER): Payer: Self-pay | Admitting: Internal Medicine

## 2019-12-10 ENCOUNTER — Other Ambulatory Visit: Payer: Self-pay

## 2019-12-10 VITALS — BP 136/80 | HR 86 | Ht 62.0 in | Wt 223.0 lb

## 2019-12-10 DIAGNOSIS — E119 Type 2 diabetes mellitus without complications: Secondary | ICD-10-CM

## 2019-12-10 LAB — POCT GLYCOSYLATED HEMOGLOBIN (HGB A1C): Hemoglobin A1C: 6.9 % — AB (ref 4.0–5.6)

## 2019-12-10 NOTE — Progress Notes (Addendum)
Name: Jeanette Pierce  Age/ Sex: 54 y.o., female   MRN/ DOB: [id number], 11-01-1965     PCP: Erven Colla, DO   Reason for Endocrinology Evaluation: Type 2 Diabetes Mellitus  Initial Endocrine Consultative Visit: 10/28/2018    PATIENT IDENTIFIER: Jeanette Pierce is a 54 y.o. female with a past medical history of T2DM. The patient has followed with Endocrinology clinic since 10/28/2018 for consultative assistance with management of her diabetes.  DIABETIC HISTORY:  Jeanette Pierce was diagnosed with DM in 2019, pt had a prescription for metformin but she admits to not taking it, she was eventually started on insulin 09/2018 during hospitalization for DKA. Her hemoglobin A1c has ranged from 6.2% in 2019, peaking at > 15.5% in 2020.   On her initial presentation to our clinic she was on metformin, lantus and novolog with an A1c > 15.5%   We stopped her MDI regimen by 03/2019 with an A1c of 5.9%  Restarted insulin by 09/2019 after hospitalization for COVID  SUBJECTIVE:   During the last visit (08/06/2019): A1c 6.6 %, continued  Metformin   Today (12/10/2019): Jeanette Pierce is here for a 3 month follow up on diabetes.  She checks her blood sugars 2 times daily, preprandial and bedtime. The patient has not had hypoglycemic episodes since the last clinic visit. Otherwise, the patient has been hospitalized for COVID related PNA in 09/2019 treated with glucocorticoids  She is feeling well     HOME DIABETES REGIMEN:  Metformin 500 mg 2 tabs BID  Lantus 6 units daily  Novolog 6 units with meals      METER DOWNLOAD SUMMARY: 10/9-10/15/2021 Average Number Tests/Day = 1.1 Overall Mean FS Glucose = 137 Standard Deviation = 10  BG Ranges: Low = 119 High = 150  BG Target % Results: % In target = 10 % Over target = 0 % Under target = 0  Hypoglycemic Events/30 Days: BG < 50 = 0 Episodes of symptomatic severe hypoglycemia = 0         HISTORY:  Past Medical History:  Past Medical  History:  Diagnosis Date  . Diabetes mellitus without complication ([place])   . Hypertension   . Migraine headache   . Reflux    Past Surgical History:  Past Surgical History:  Procedure Laterality Date  . CHOLECYSTECTOMY  before 1999  . iliocolotomy  1999  . TUBAL LIGATION  1991    Social History:  reports that she has never smoked. She has never used smokeless tobacco. She reports previous alcohol use. She reports previous drug use. Family History:  Family History  Problem Relation Age of Onset  . Heart disease Mother   . COPD Father   . Asthma Father   . Cancer Maternal Grandfather        colon and stomach     HOME MEDICATIONS: Allergies as of 12/10/2019      Reactions   Erythromycin Nausea And Vomiting   Nausea, vomiting      Medication List       Accurate as of December 10, 2019 10:47 AM. If you have any questions, ask your nurse or doctor.        STOP taking these medications   benzonatate 100 MG capsule Commonly known as: TESSALON Stopped by: Dorita Sciara, MD     TAKE these medications   albuterol 108 (90 Base) MCG/ACT inhaler Commonly known as: VENTOLIN HFA Inhale 2 puffs into the lungs every 6 (six) hours  as needed for wheezing or shortness of breath.   atenolol 100 MG tablet Commonly known as: TENORMIN Take 1 tablet (100 mg total) by mouth daily.   atorvastatin 10 MG tablet Commonly known as: LIPITOR Take 1 tablet (10 mg total) by mouth daily.   blood glucose meter kit and supplies Dispense based on patient and insurance preference. Use up to four times daily as directed. (FOR ICD-10 E10.9, E11.9). What changed: additional instructions   guaiFENesin-dextromethorphan 100-10 MG/5ML syrup Commonly known as: ROBITUSSIN DM Take 10 mLs by mouth every 4 (four) hours as needed for cough.   ibuprofen 800 MG tablet Commonly known as: ADVIL TAKE 1 TABLET BY MOUTH EVERY 6 HOURS AS NEEDED   imipramine 25 MG tablet Commonly known as:  TOFRANIL Take 4 tablets (100 mg total) by mouth at bedtime.   insulin aspart 100 UNIT/ML injection Commonly known as: novoLOG Inject 6 Units into the skin 3 (three) times daily before meals.   Lantus 100 UNIT/ML injection Generic drug: insulin glargine Inject 6 Units into the skin daily. Inject 6 units daily   loperamide 2 MG capsule Commonly known as: IMODIUM Take 1 capsule (2 mg total) by mouth as needed for diarrhea or loose stools.   Maxalt 10 MG tablet Generic drug: rizatriptan Take 10 mg by mouth daily as needed (for migraine pain).   metFORMIN 500 MG 24 hr tablet Commonly known as: GLUCOPHAGE-XR TAKE 2 TABLETS BY MOUTH TWICE DAILY WITH A MEAL   multivitamin tablet Take 1 tablet by mouth daily.        OBJECTIVE:   Vital Signs: BP 136/80 (BP Location: Right Arm, Patient Position: Sitting, Cuff Size: Small)   Pulse 86   Ht '5\' 2"'  (1.575 m)   Wt 223 lb (101.2 kg)   SpO2 98%   BMI 40.79 kg/m   Wt Readings from Last 3 Encounters:  12/10/19 223 lb (101.2 kg)  10/22/19 215 lb (97.5 kg)  08/06/19 229 lb 6.4 oz (104.1 kg)     Exam: General: Pt appears well and is in NAD  Lungs: Clear with good BS bilat with no rales, rhonchi, or wheezes  Heart: RRR with normal S1 and S2 and no gallops; no murmurs; no rub  Abdomen: Normoactive bowel sounds, soft, nontender, without masses or organomegaly palpable  Extremities: No pretibial edema.   Neuro: MS is good with appropriate affect, pt is alert and Ox3    DM foot exam: 12/10/2019  The skin of the feet is intact without sores or ulcerations. The pedal pulses are 2+ on right and 2+ on left. The sensation is intact to a screening 5.07, 10 gram monofilament bilaterally    DATA REVIEWED:  Lab Results  Component Value Date   HGBA1C 6.9 (A) 12/10/2019   HGBA1C 6.6 (A) 08/06/2019   HGBA1C 5.9 (A) 04/08/2019   Lab Results  Component Value Date   MICROALBUR 4.1 (H) 04/08/2019   LDLCALC 113 (H) 06/03/2017    CREATININE 0.92 10/24/2019    Lab Results  Component Value Date   CHOL 179 04/08/2019   HDL 55.70 04/08/2019   LDLCALC 113 (H) 06/03/2017   LDLDIRECT 100.0 04/08/2019   TRIG 116 10/23/2019   CHOLHDL 3 04/08/2019      ASSESSMENT / PLAN / RECOMMENDATIONS:   1) Type 2 Diabetes Mellitus, Optimally controlled, Without complications - Most recent A1c of 6.9 %. Goal A1c < 7.0 %.     -A1c continues to be at goal - She is back on  MDI regimen due to hyperglycemia following COVID PNA and steroid intake     MEDICATIONS:  -Continue Metformin 500 mg 2 tabs BID  - Increase Lantus to 20 units daily  - Correction Factor: Novolog ( BG-130/40)  EDUCATION / INSTRUCTIONS:  BG monitoring instructions: Patient is instructed to check her blood sugars 1 times a day , fasting   Call [facility] if: BG persistently < 70  . I reviewed the Rule of 15 for the treatment of hypoglycemia in detail with the patient. Literature supplied.   2.Hypertriglyceridemia:   -Triglycerides are elevated, LDL is borderline.  Atorvastatin was started in 03/2019.  We will consider lipid panel on next visit.     F/U in 4 months    Signed electronically by: Mack Guise, MD  [facility] Endocrinology  [facility] [address] [place], [place] [address] Phone: [phone] FAX: [phone]   CC: Irena Reichmann [place] [address] Phone: [phone]  Fax: [phone]  Return to Endocrinology clinic as below: Future Appointments  Date Time Provider [place]  12/23/2019 10:30 AM Erven Colla, DO [facility] [place]

## 2019-12-10 NOTE — Patient Instructions (Addendum)
-   Continue Metformin 500 mg, 2 tablets with Breakfast and supper - Increase Lantus to 20 units daily  -Novolog correctional insulin:  Use the scale below to help guide you:   Blood sugar before meal Number of units to inject  Less than 170 0 unit  171 -  210 1 units  211 -  250 2 units  251 -  290 3 units  291 -  330 4 units  331 -  370 5 units  371 -  410 6 units      HOW TO TREAT LOW BLOOD SUGARS (Blood sugar LESS THAN 70 MG/DL)  Please follow the RULE OF 15 for the treatment of hypoglycemia treatment (when your (blood sugars are less than 70 mg/dL)    STEP 1: Take 15 grams of carbohydrates when your blood sugar is low, which includes:   3-4 GLUCOSE TABS  OR  3-4 OZ OF JUICE OR REGULAR SODA OR  ONE TUBE OF GLUCOSE GEL     STEP 2: RECHECK blood sugar in 15 MINUTES STEP 3: If your blood sugar is still low at the 15 minute recheck --> then, go back to STEP 1 and treat AGAIN with another 15 grams of carbohydrates.

## 2019-12-23 ENCOUNTER — Ambulatory Visit (INDEPENDENT_AMBULATORY_CARE_PROVIDER_SITE_OTHER): Payer: Self-pay | Admitting: Family Medicine

## 2019-12-23 ENCOUNTER — Other Ambulatory Visit: Payer: Self-pay

## 2019-12-23 ENCOUNTER — Encounter: Payer: Self-pay | Admitting: Family Medicine

## 2019-12-23 VITALS — BP 130/86 | HR 78 | Temp 98.1°F | Ht 62.0 in | Wt 223.0 lb

## 2019-12-23 DIAGNOSIS — G43109 Migraine with aura, not intractable, without status migrainosus: Secondary | ICD-10-CM

## 2019-12-23 DIAGNOSIS — E1165 Type 2 diabetes mellitus with hyperglycemia: Secondary | ICD-10-CM

## 2019-12-23 DIAGNOSIS — L502 Urticaria due to cold and heat: Secondary | ICD-10-CM

## 2019-12-23 DIAGNOSIS — T380X5A Adverse effect of glucocorticoids and synthetic analogues, initial encounter: Secondary | ICD-10-CM

## 2019-12-23 DIAGNOSIS — R739 Hyperglycemia, unspecified: Secondary | ICD-10-CM

## 2019-12-23 DIAGNOSIS — Z794 Long term (current) use of insulin: Secondary | ICD-10-CM

## 2019-12-23 NOTE — Progress Notes (Signed)
Patient ID: Jeanette Pierce, female    DOB: 05-Sep-1965, 54 y.o.   MRN: [id number]   Chief Complaint  Patient presents with  . Migraine   Subjective:    HPI  Pt seen for f/u on medication for migraines and DM2. Seeing endocrinologist. Having 4-5 migraines per month.  Lots of stress with daughter and 4 grandkids in house living with her.  Concerns about itching all over for about 2 years. Happens after taking a shower. Has changed shampoos, conditioners, soaps. Makes sure she is rinsed good. Itching last about one hour after shower.   Pt had recently been in hospital and was on steroids and inc her blood glucose. 10/23/19- was hospital for covid pneumonia.  Had remdesivir and decadron.   Used to see neuro, Claudie Fisherman. Moved to new practice in [place]. Now pt on imipramine to help prevent and bcp for hormone control to help with migraines.  Pt had BTL.  Not on bcp now. And on atenolol for migraines/bp.  Pt thinking most of her migraines are stress trigger.  Migraines- on side on left.  Seeing spots occ, not often. No numbness or tingling. Nausea. No vomiting. sens to light/sound.  Elevated LFTs, had u/s in 8/21- had fatty liver disease.  In 8/20- had DKA.   Medical History Andrina has a past medical history of Diabetes mellitus without complication ([place]), Hypertension, Migraine headache, and Reflux.   Outpatient Encounter Medications as of 12/23/2019  Medication Sig  . Ascorbic Acid (VITAMIN C PO) Take by mouth.  Marland Kitchen atenolol (TENORMIN) 100 MG tablet Take 1 tablet (100 mg total) by mouth daily.  Marland Kitchen atorvastatin (LIPITOR) 10 MG tablet Take 1 tablet (10 mg total) by mouth daily.  . blood glucose meter kit and supplies Dispense based on patient and insurance preference. Use up to four times daily as directed. (FOR ICD-10 E10.9, E11.9). (Patient taking differently: Dispense based on patient and insurance preference. Use up to 3 times weekly as directed. (FOR ICD-10 E10.9, E11.9).)  .  ibuprofen (ADVIL) 800 MG tablet TAKE 1 TABLET BY MOUTH EVERY 6 HOURS AS NEEDED  . imipramine (TOFRANIL) 25 MG tablet Take 4 tablets (100 mg total) by mouth at bedtime.  . insulin aspart (NOVOLOG) 100 UNIT/ML injection Inject 6 Units into the skin 3 (three) times daily before meals.  . insulin glargine (LANTUS) 100 UNIT/ML injection Inject 6 Units into the skin daily. Inject 6 units daily  . metFORMIN (GLUCOPHAGE-XR) 500 MG 24 hr tablet TAKE 2 TABLETS BY MOUTH TWICE DAILY WITH A MEAL  . Multiple Vitamin (MULTIVITAMIN) tablet Take 1 tablet by mouth daily.  . rizatriptan (MAXALT) 10 MG tablet Take 10 mg by mouth daily as needed (for migraine pain).   . [DISCONTINUED] albuterol (VENTOLIN HFA) 108 (90 Base) MCG/ACT inhaler Inhale 2 puffs into the lungs every 6 (six) hours as needed for wheezing or shortness of breath.  . [DISCONTINUED] loperamide (IMODIUM) 2 MG capsule Take 1 capsule (2 mg total) by mouth as needed for diarrhea or loose stools.  . [DISCONTINUED] guaiFENesin-dextromethorphan (ROBITUSSIN DM) 100-10 MG/5ML syrup Take 10 mLs by mouth every 4 (four) hours as needed for cough.   No facility-administered encounter medications on file as of 12/23/2019.     Review of Systems  Constitutional: Negative for chills and fever.  HENT: Negative for congestion, rhinorrhea and sore throat.   Respiratory: Negative for cough, shortness of breath and wheezing.   Cardiovascular: Negative for chest pain and leg swelling.  Gastrointestinal: Negative  for abdominal pain, diarrhea, nausea and vomiting.  Genitourinary: Negative for dysuria and frequency.  Musculoskeletal: Negative for arthralgias and back pain.  Skin: Negative for rash.       +itching after shower.  Neurological: Negative for dizziness, weakness and headaches.     Vitals BP 130/86   Pulse 78   Temp 98.1 F (36.7 C)   Ht _0  (1.575 m)   Wt 223 lb (101.2 kg)   SpO2 97%   BMI 40.79 kg/m   Objective:   Physical Exam Vitals  and nursing note reviewed.  Constitutional:      General: She is not in acute distress.    Appearance: Normal appearance. She is not ill-appearing.  HENT:     Head: Normocephalic and atraumatic.  Eyes:     Extraocular Movements: Extraocular movements intact.     Conjunctiva/sclera: Conjunctivae normal.     Pupils: Pupils are equal, round, and reactive to light.  Cardiovascular:     Rate and Rhythm: Normal rate and regular rhythm.     Pulses: Normal pulses.     Heart sounds: Normal heart sounds.  Pulmonary:     Effort: Pulmonary effort is normal.     Breath sounds: Normal breath sounds. No wheezing, rhonchi or rales.  Musculoskeletal:        General: Normal range of motion.     Right lower leg: No edema.     Left lower leg: No edema.  Skin:    General: Skin is warm and dry.     Findings: No lesion or rash.  Neurological:     General: No focal deficit present.     Mental Status: She is alert and oriented to person, place, and time.     Cranial Nerves: No cranial nerve deficit.  Psychiatric:        Mood and Affect: Mood normal.        Behavior: Behavior normal.        Thought Content: Thought content normal.        Judgment: Judgment normal.     Assessment and Plan   1. Migraine with aura and without status migrainosus, not intractable  2. Type 2 diabetes mellitus with hyperglycemia, with long-term current use of insulin (HCC)  3. Steroid-induced hyperglycemia  4. Urticaria due to heat    Migraines- stable.  Cont meds, atenolol.   H/o DM2- seeing endo.  Cont f/u with endo.  Pt is on sliding scale to take in meantime after dc from hospital and being on steroids for covid. Taking 6 units of lantus and sliding scale novolog. So inc the lantus 20 units in AM. Still taking novolog if needed. If over 170 then taking sliding scale novolog.  Pt working on getting glucose more under control.  Pt advised to cont to watch carb intake.  Urticaria- likely related to hot  showers/heat.  Recommending taking zyrtec daily.  F/u [place] prn.

## 2020-01-02 ENCOUNTER — Encounter: Payer: Self-pay | Admitting: Family Medicine

## 2020-01-03 ENCOUNTER — Telehealth: Payer: Self-pay | Admitting: Family Medicine

## 2020-01-03 MED ORDER — IBUPROFEN 800 MG PO TABS
800.0000 mg | ORAL_TABLET | Freq: Three times a day (TID) | ORAL | 2 refills | Status: DC | PRN
Start: 2020-01-03 — End: 2020-12-01

## 2020-01-03 NOTE — Telephone Encounter (Signed)
[facility] requesting refill on Ibuprofen 800 mg tablet. Take one tablet po prn. Last seen 12/23/2019. Please advise. Thank you

## 2020-01-03 NOTE — Addendum Note (Signed)
Addended by: Annalee Genta on: 01/03/2020 05:19 PM   Modules accepted: Orders

## 2020-03-21 ENCOUNTER — Other Ambulatory Visit: Payer: Self-pay | Admitting: *Deleted

## 2020-03-21 ENCOUNTER — Other Ambulatory Visit: Payer: Self-pay | Admitting: Internal Medicine

## 2020-03-21 MED ORDER — ATENOLOL 100 MG PO TABS
100.0000 mg | ORAL_TABLET | Freq: Every day | ORAL | 0 refills | Status: DC
Start: 2020-03-21 — End: 2020-06-22

## 2020-04-13 ENCOUNTER — Telehealth: Payer: Self-pay | Admitting: Family Medicine

## 2020-04-13 ENCOUNTER — Other Ambulatory Visit: Payer: Self-pay | Admitting: Internal Medicine

## 2020-04-13 MED ORDER — IMIPRAMINE HCL 25 MG PO TABS
100.0000 mg | ORAL_TABLET | Freq: Every day | ORAL | 5 refills | Status: DC
Start: 2020-04-13 — End: 2020-06-22

## 2020-04-13 NOTE — Addendum Note (Signed)
Addended by: Annalee Genta on: 04/13/2020 04:55 PM   Modules accepted: Orders

## 2020-04-13 NOTE — Telephone Encounter (Signed)
Pharmacy requesting refill on Imipram HCL 25 mg take 4 tablets po at bedtime. Pt last seen 12/23/19 for migraine. Pt has upcoming appt in April. Please advise. Thank you

## 2020-04-14 ENCOUNTER — Ambulatory Visit (INDEPENDENT_AMBULATORY_CARE_PROVIDER_SITE_OTHER): Payer: Self-pay | Admitting: Internal Medicine

## 2020-04-14 ENCOUNTER — Encounter: Payer: Self-pay | Admitting: Internal Medicine

## 2020-04-14 ENCOUNTER — Other Ambulatory Visit: Payer: Self-pay

## 2020-04-14 VITALS — BP 134/82 | HR 76 | Ht 62.0 in | Wt 227.2 lb

## 2020-04-14 DIAGNOSIS — E1165 Type 2 diabetes mellitus with hyperglycemia: Secondary | ICD-10-CM

## 2020-04-14 DIAGNOSIS — L659 Nonscarring hair loss, unspecified: Secondary | ICD-10-CM | POA: Insufficient documentation

## 2020-04-14 DIAGNOSIS — E785 Hyperlipidemia, unspecified: Secondary | ICD-10-CM

## 2020-04-14 LAB — POCT GLYCOSYLATED HEMOGLOBIN (HGB A1C): Hemoglobin A1C: 6.8 % — AB (ref 4.0–5.6)

## 2020-04-14 LAB — LIPID PANEL
Cholesterol: 149 mg/dL (ref 0–200)
HDL: 54.5 mg/dL (ref >39.00)
LDL Cholesterol: 57 mg/dL (ref 0–99)
NonHDL: 94.04
Total CHOL/HDL Ratio: 3
Triglycerides: 183 mg/dL — ABNORMAL HIGH (ref 0.0–149.0)
VLDL: 36.6 mg/dL (ref 0.0–40.0)

## 2020-04-14 LAB — TSH: TSH: 2.09 u[IU]/mL (ref 0.35–4.50)

## 2020-04-14 NOTE — Progress Notes (Signed)
Name: Jeanette Pierce  Age/ Sex: 55 y.o., female   MRN/ DOB: [id number], Jan 13, 1966     PCP: Erven Colla, DO   Reason for Endocrinology Evaluation: Type 2 Diabetes Mellitus  Initial Endocrine Consultative Visit: 10/28/2018    PATIENT IDENTIFIER: [name] is a 55 y.o. female with a past medical history of T2DM. The patient has followed with Endocrinology clinic since 10/28/2018 for consultative assistance with management of her diabetes.  DIABETIC HISTORY:  [name] was diagnosed with DM in 2019, pt had a prescription for metformin but she admits to not taking it, she was eventually started on insulin 09/2018 during hospitalization for DKA. Her hemoglobin A1c has ranged from 6.2% in 2019, peaking at > 15.5% in 2020.   On her initial presentation to our clinic she was on metformin, lantus and novolog with an A1c > 15.5%   We stopped her MDI regimen by 03/2019 with an A1c of 5.9%  Restarted insulin by 09/2019 after hospitalization for COVID  SUBJECTIVE:   During the last visit (12/10/2019): A1c 6.9 %, continued  Metformin and increased Lantus       Today (04/14/2020): Ms. Greaser is here for a  follow up on diabetes.  She checks her blood sugars 1 times daily   She has been stressed and  Noted hair loss, that she attributes to metformin, has been taking 2 tabs daily    HOME DIABETES REGIMEN:  Metformin 500 mg 2 tabs BID - 2 tabs daily  Lantus 20 units daily  CF: Novolog ( BG-130/40)    METER DOWNLOAD SUMMARY: 2/4-2/18/2022 Average Number Tests/Day = 1 Overall Mean FS Glucose = 127 Standard Deviation = 14  BG Ranges: Low = 110 High = 164  Hypoglycemic Events/30 Days: BG < 50 = 0 Episodes of symptomatic severe hypoglycemia = 0         HISTORY:  Past Medical History:  Past Medical History:  Diagnosis Date  . Diabetes mellitus without complication ([place])   . Hypertension   . Migraine headache   . Reflux    Past Surgical History:  Past Surgical  History:  Procedure Laterality Date  . CHOLECYSTECTOMY  before 1999  . iliocolotomy  1999  . TUBAL LIGATION  1991    Social History:  reports that she has never smoked. She has never used smokeless tobacco. She reports previous alcohol use. She reports previous drug use. Family History:  Family History  Problem Relation Age of Onset  . Heart disease Mother   . COPD Father   . Asthma Father   . Cancer Maternal Grandfather        colon and stomach     HOME MEDICATIONS: Allergies as of 04/14/2020      Reactions   Erythromycin Nausea And Vomiting   Nausea, vomiting      Medication List       Accurate as of April 14, 2020 10:35 AM. If you have any questions, ask your nurse or doctor.        atenolol 100 MG tablet Commonly known as: TENORMIN Take 1 tablet (100 mg total) by mouth daily.   atorvastatin 10 MG tablet Commonly known as: LIPITOR Take 1 tablet by mouth once daily   blood glucose meter kit and supplies Dispense based on patient and insurance preference. Use up to four times daily as directed. (FOR ICD-10 E10.9, E11.9). What changed: additional instructions   ibuprofen 800 MG tablet Commonly known as: ADVIL Take 1  tablet (800 mg total) by mouth every 8 (eight) hours as needed.   imipramine 25 MG tablet Commonly known as: TOFRANIL Take 4 tablets (100 mg total) by mouth at bedtime.   insulin aspart 100 UNIT/ML injection Commonly known as: novoLOG Inject 6 Units into the skin 3 (three) times daily before meals.   insulin glargine 100 UNIT/ML injection Commonly known as: LANTUS Inject 20 Units into the skin daily.   metFORMIN 500 MG 24 hr tablet Commonly known as: GLUCOPHAGE-XR TAKE 2 TABLETS BY MOUTH TWICE DAILY WITH A MEAL   multivitamin tablet Take 1 tablet by mouth daily.   rizatriptan 10 MG tablet Commonly known as: MAXALT Take 10 mg by mouth daily as needed (for migraine pain).   VITAMIN C PO Take by mouth.        OBJECTIVE:    Vital Signs: BP 134/82   Pulse 76   Ht '5\' 2"'  (1.575 m)   Wt 227 lb 4 oz (103.1 kg)   SpO2 98%   BMI 41.56 kg/m   Wt Readings from Last 3 Encounters:  04/14/20 227 lb 4 oz (103.1 kg)  12/23/19 223 lb (101.2 kg)  12/10/19 223 lb (101.2 kg)     Exam: General: Pt appears well and is in NAD  Lungs: Clear with good BS bilat with no rales, rhonchi, or wheezes  Heart: RRR with normal S1 and S2 and no gallops; no murmurs; no rub  Abdomen: Normoactive bowel sounds, soft, nontender, without masses or organomegaly palpable  Extremities: No pretibial edema.   Neuro: MS is good with appropriate affect, pt is alert and Ox3    DM foot exam: 12/10/2019  The skin of the feet is intact without sores or ulcerations. The pedal pulses are 2+ on right and 2+ on left. The sensation is intact to a screening 5.07, 10 gram monofilament bilaterally    DATA REVIEWED:  Lab Results  Component Value Date   HGBA1C 6.8 (A) 04/14/2020   HGBA1C 6.9 (A) 12/10/2019   HGBA1C 6.6 (A) 08/06/2019   Lab Results  Component Value Date   MICROALBUR 4.1 (H) 04/08/2019   LDLCALC 113 (H) 06/03/2017   CREATININE 0.92 10/24/2019   [name], [name] (MRN [id number]) as of 04/17/2020 13:27  Ref. Range 04/14/2020 11:03  Total CHOL/HDL Ratio Unknown 3  Cholesterol Latest Ref Range: 0 - 200 mg/dL 149  HDL Cholesterol Latest Ref Range: >39.00 mg/dL 54.50  LDL (calc) Latest Ref Range: 0 - 99 mg/dL 57  NonHDL Unknown 94.04  Triglycerides Latest Ref Range: 0.0 - 149.0 mg/dL 183.0 (H)  VLDL Latest Ref Range: 0.0 - 40.0 mg/dL 36.6  TSH Latest Ref Range: 0.35 - 4.50 uIU/mL 2.09   ASSESSMENT / PLAN / RECOMMENDATIONS:   1) Type 2 Diabetes Mellitus, Optimally controlled, Without complications - Most recent A1c of 6.8 %. Goal A1c < 7.0 %.     -A1c continues to be at goal - She takes 2 tablets of metformin daily instead of 4 as she attributes hair loss to Metformin  - No changes today, she will be provided with  a correction scale to be used for hyperglycemia for Novolog only    MEDICATIONS:  -Continue Metformin 500 mg 2 tabs daily  - Continue  Lantus  20 units daily  - Correction Factor: Novolog ( BG-130/40)  EDUCATION / INSTRUCTIONS:  BG monitoring instructions: Patient is instructed to check her blood sugars 1 times a day , fasting   Call [facility] Endocrinology clinic  if: BG persistently < 70  . I reviewed the Rule of 15 for the treatment of hypoglycemia in detail with the patient. Literature supplied.   2.Hypertriglyceridemia:     - She was started on Atorvastatin 03/2019 due to elevated Triglycerides 220 mg/dL  and borderline LDL at 100 mg/dL. Levels trending down   Medication Continue Atorvastatin 10 mg daily    3. Hair loss:  - Discussed thyroid vs stress vs vitamin deficiency vs hereditary factors  - Thyroid is normal     F/U in 6 months    Signed electronically by: Mack Guise, MD  [facility] Endocrinology  [facility] [address] [place], [place] [address] Phone: [phone] FAX: [phone]   CC: Irena Reichmann [address] [place] [address] Phone: [phone]  Fax: [phone]  Return to Endocrinology clinic as below: Future Appointments  Date Time Provider [place]  06/22/2020 10:00 AM Erven Colla, DO [facility]

## 2020-04-14 NOTE — Patient Instructions (Signed)
-   Continue Metformin 500 mg, 2 tablets with supper - Continue Lantus  20 units daily  -Novolog correctional insulin:  Use the scale below to help guide you:   Blood sugar before meal Number of units to inject  Less than 170 0 unit  171 -  210 1 units  211 -  250 2 units  251 -  290 3 units  291 -  330 4 units  331 -  370 5 units  371 -  410 6 units      HOW TO TREAT LOW BLOOD SUGARS (Blood sugar LESS THAN 70 MG/DL)  Please follow the RULE OF 15 for the treatment of hypoglycemia treatment (when your (blood sugars are less than 70 mg/dL)    STEP 1: Take 15 grams of carbohydrates when your blood sugar is low, which includes:   3-4 GLUCOSE TABS  OR  3-4 OZ OF JUICE OR REGULAR SODA OR  ONE TUBE OF GLUCOSE GEL     STEP 2: RECHECK blood sugar in 15 MINUTES STEP 3: If your blood sugar is still low at the 15 minute recheck --> then, go back to STEP 1 and treat AGAIN with another 15 grams of carbohydrates.

## 2020-04-17 MED ORDER — ATORVASTATIN CALCIUM 10 MG PO TABS
10.0000 mg | ORAL_TABLET | Freq: Every day | ORAL | 3 refills | Status: DC
Start: 2020-04-17 — End: 2020-10-17

## 2020-06-22 ENCOUNTER — Encounter: Payer: Self-pay | Admitting: Family Medicine

## 2020-06-22 ENCOUNTER — Ambulatory Visit (INDEPENDENT_AMBULATORY_CARE_PROVIDER_SITE_OTHER): Payer: Self-pay | Admitting: Family Medicine

## 2020-06-22 ENCOUNTER — Other Ambulatory Visit: Payer: Self-pay

## 2020-06-22 VITALS — BP 138/88 | HR 76 | Temp 97.3°F | Ht 62.0 in | Wt 230.0 lb

## 2020-06-22 DIAGNOSIS — E119 Type 2 diabetes mellitus without complications: Secondary | ICD-10-CM

## 2020-06-22 DIAGNOSIS — I10 Essential (primary) hypertension: Secondary | ICD-10-CM

## 2020-06-22 DIAGNOSIS — G43109 Migraine with aura, not intractable, without status migrainosus: Secondary | ICD-10-CM

## 2020-06-22 MED ORDER — IMIPRAMINE HCL 25 MG PO TABS: 100.0000 mg | ORAL_TABLET | Freq: Every day | ORAL | 5 refills | Status: DC

## 2020-06-22 MED ORDER — ATENOLOL 100 MG PO TABS
100.0000 mg | ORAL_TABLET | Freq: Every day | ORAL | 0 refills | Status: DC
Start: 2020-06-22 — End: 2020-10-02

## 2020-06-22 MED ORDER — RIZATRIPTAN BENZOATE 10 MG PO TABS
10.0000 mg | ORAL_TABLET | Freq: Every day | ORAL | 5 refills | Status: DC | PRN
Start: 2020-06-22 — End: 2021-05-16

## 2020-06-22 NOTE — Progress Notes (Signed)
Patient ID: Jeanette Pierce, female    DOB: [birth year], 55 y.o.   MRN: [id number]   Chief Complaint  Patient presents with  . Diabetes    Very dorwzy all day , and sleep pattern off, Weight concerns    Subjective:    HPI H/o DM2- Pt seeing endo for diabetes. Taking lantus and metformin. No longer on novolog.  Last a1c was 6.8.   Migraines- Getting headaches, not as bad as used to be.  occ getting a bad headache.  Getting about 1 x per month.  Taking maxalt.  Taking imipramine at bedtime also. Taking atenolol and taking more migraines. Used to be on birth control meds for regulation of hormones. Too expensive and stopped it.  Concern of weight increasing.  Feeling cycling of up and down 5-7 lbs.  Going up and down 5-7 lbs. Not doing much exercising at this time.  Has increase in stress at home.  Medical History Kili has a past medical history of Diabetes mellitus without complication ([place]), Hypertension, Migraine headache, and Reflux.   Outpatient Encounter Medications as of 06/22/2020  Medication Sig  . Ascorbic Acid (VITAMIN C PO) Take by mouth.  Marland Kitchen atenolol (TENORMIN) 100 MG tablet Take 1 tablet (100 mg total) by mouth daily.  Marland Kitchen atorvastatin (LIPITOR) 10 MG tablet Take 1 tablet (10 mg total) by mouth daily.  . blood glucose meter kit and supplies Dispense based on patient and insurance preference. Use up to four times daily as directed. (FOR ICD-10 E10.9, E11.9). (Patient taking differently: Dispense based on patient and insurance preference. Use up to 3 times weekly as directed. (FOR ICD-10 E10.9, E11.9).)  . ibuprofen (ADVIL) 800 MG tablet Take 1 tablet (800 mg total) by mouth every 8 (eight) hours as needed.  Marland Kitchen imipramine (TOFRANIL) 25 MG tablet Take 4 tablets (100 mg total) by mouth at bedtime.  . insulin aspart (NOVOLOG) 100 UNIT/ML injection Inject 6 Units into the skin 3 (three) times daily before meals.  . insulin glargine (LANTUS) 100 UNIT/ML injection Inject 20  Units into the skin daily.  . metFORMIN (GLUCOPHAGE-XR) 500 MG 24 hr tablet TAKE 2 TABLETS BY MOUTH TWICE DAILY WITH A MEAL  . Multiple Vitamin (MULTIVITAMIN) tablet Take 1 tablet by mouth daily.  . rizatriptan (MAXALT) 10 MG tablet Take 1 tablet (10 mg total) by mouth daily as needed (for migraine pain).  . [DISCONTINUED] atenolol (TENORMIN) 100 MG tablet Take 1 tablet (100 mg total) by mouth daily.  . [DISCONTINUED] imipramine (TOFRANIL) 25 MG tablet Take 4 tablets (100 mg total) by mouth at bedtime.  . [DISCONTINUED] rizatriptan (MAXALT) 10 MG tablet Take 10 mg by mouth daily as needed (for migraine pain).    No facility-administered encounter medications on file as of 06/22/2020.     Review of Systems  Constitutional: Negative for chills and fever.  HENT: Negative for congestion, rhinorrhea and sore throat.   Respiratory: Negative for cough, shortness of breath and wheezing.   Cardiovascular: Negative for chest pain and leg swelling.  Gastrointestinal: Negative for abdominal pain, diarrhea, nausea and vomiting.  Genitourinary: Negative for dysuria and frequency.  Musculoskeletal: Negative for arthralgias and back pain.  Skin: Negative for rash.  Neurological: Negative for dizziness, weakness and headaches.  Psychiatric/Behavioral: Negative for dysphoric mood and sleep disturbance. The patient is nervous/anxious (inc stress with family/home life).      Vitals BP 138/88   Pulse 76   Temp (!) 97.3 F (36.3 C)   Ht 5'  2" (1.575 m)   Wt 230 lb (104.3 kg)   SpO2 99%   BMI 42.07 kg/m   Objective:   Physical Exam Vitals and nursing note reviewed.  Constitutional:      Appearance: Normal appearance.  HENT:     Head: Normocephalic and atraumatic.  Eyes:     Extraocular Movements: Extraocular movements intact.     Conjunctiva/sclera: Conjunctivae normal.     Pupils: Pupils are equal, round, and reactive to light.  Cardiovascular:     Rate and Rhythm: Normal rate and regular  rhythm.     Pulses: Normal pulses.     Heart sounds: Normal heart sounds. No murmur heard.   Pulmonary:     Effort: Pulmonary effort is normal. No respiratory distress.     Breath sounds: Normal breath sounds. No wheezing, rhonchi or rales.  Musculoskeletal:        General: Normal range of motion.     Right lower leg: No edema.     Left lower leg: No edema.  Skin:    General: Skin is warm and dry.     Findings: No lesion or rash.  Neurological:     General: No focal deficit present.     Mental Status: She is alert and oriented to person, place, and time.  Psychiatric:        Mood and Affect: Mood normal.        Behavior: Behavior normal.      /   1. Primary hypertension  2. Type 2 diabetes mellitus without complication, without long-term current use of insulin ([place])  3. Migraine with aura and without status migrainosus, not intractable   htn- suboptimal.   recheck on next visit.  Has inc stress with daughter and 5 children living with her.  Pt wanting to hold off on adding additional bp med and wnating to work on diet and losing weight.  Advised we could give her 66month to see if can improve.  Eat dash diet and increase in exercising and work toward losing 5-7 lbs.  Pt in agreement.  Pt to call if seeing high bp at home 140/90 or above.  Pt to continue atenolol, which is partially for her migraine tx.   Migraines- stable. Cont meds, imipramine, atenolol, maxalt.  DM2- stable, a1c 6.8. cont f/u with endo. Cont diabetic diet and inc in exercising. Cont meds.   Return in about 3 months (around 09/21/2020) for f/u recheck bp..    BP Readings from Last 3 Encounters:  06/22/20 138/88  04/14/20 134/82  12/23/19 130/86   Wt Readings from Last 3 Encounters:  06/22/20 230 lb (104.3 kg)  04/14/20 227 lb 4 oz (103.1 kg)  12/23/19 223 lb (101.2 kg)

## 2020-06-22 NOTE — Patient Instructions (Signed)

## 2020-09-02 ENCOUNTER — Other Ambulatory Visit: Payer: Self-pay | Admitting: Internal Medicine

## 2020-09-21 ENCOUNTER — Encounter: Payer: Self-pay | Admitting: Family Medicine

## 2020-09-21 ENCOUNTER — Ambulatory Visit (INDEPENDENT_AMBULATORY_CARE_PROVIDER_SITE_OTHER): Payer: Self-pay | Admitting: Family Medicine

## 2020-09-21 ENCOUNTER — Other Ambulatory Visit: Payer: Self-pay

## 2020-09-21 VITALS — Temp 98.2°F | Ht 62.0 in | Wt 232.0 lb

## 2020-09-21 DIAGNOSIS — Z794 Long term (current) use of insulin: Secondary | ICD-10-CM

## 2020-09-21 DIAGNOSIS — S2002XA Contusion of left breast, initial encounter: Secondary | ICD-10-CM

## 2020-09-21 DIAGNOSIS — E782 Mixed hyperlipidemia: Secondary | ICD-10-CM

## 2020-09-21 DIAGNOSIS — I10 Essential (primary) hypertension: Secondary | ICD-10-CM

## 2020-09-21 DIAGNOSIS — E118 Type 2 diabetes mellitus with unspecified complications: Secondary | ICD-10-CM

## 2020-09-21 MED ORDER — LISINOPRIL 20 MG PO TABS: 20.0000 mg | ORAL_TABLET | Freq: Every day | ORAL | 1 refills | Status: DC

## 2020-09-21 NOTE — Progress Notes (Signed)
Patient ID: Jeanette Pierce, female    DOB: 05/08/1965, 55 y.o.   MRN: [id number]   Chief Complaint  Patient presents with   Hypertension  3 month follow up  Hypertension  Subjective:    HPI H/o dm2, elevated bp, and hld. Not on med for bp, but does take atenolol for migraines at night.  Out of lantus and then taking novolog and metformin.  HTN Pt compliant with BP meds.  No SEs Denies chest pain, sob, LE swelling, or blurry vision.   Dm2- Compliant with medications. Checking blood glucose.   Not seeing any high or low numbers.  Denies polyuria or polydipsia.  Eye exam: overdue Foot exam: no concerns Lab Results  Component Value Date   HGBA1C 6.8 (A) 04/14/2020   HLD- doing well no new concerns.  Compliant with meds. No chest pain, palpitations, myalgias or joint pains.   Medical History Kadelyn has a past medical history of Diabetes mellitus without complication ([place]), Hypertension, Migraine headache, and Reflux.   Outpatient Encounter Medications as of 09/21/2020  Medication Sig   Ascorbic Acid (VITAMIN C PO) Take by mouth.   atorvastatin (LIPITOR) 10 MG tablet Take 1 tablet (10 mg total) by mouth daily.   blood glucose meter kit and supplies Dispense based on patient and insurance preference. Use up to four times daily as directed. (FOR ICD-10 E10.9, E11.9). (Patient taking differently: Dispense based on patient and insurance preference. Use up to 3 times weekly as directed. (FOR ICD-10 E10.9, E11.9).)   ibuprofen (ADVIL) 800 MG tablet Take 1 tablet (800 mg total) by mouth every 8 (eight) hours as needed.   imipramine (TOFRANIL) 25 MG tablet Take 4 tablets (100 mg total) by mouth at bedtime.   insulin glargine (LANTUS) 100 UNIT/ML injection Inject 20 Units into the skin daily.   lisinopril (ZESTRIL) 20 MG tablet Take 1 tablet (20 mg total) by mouth daily.   metFORMIN (GLUCOPHAGE-XR) 500 MG 24 hr tablet TAKE 2 TABLETS BY MOUTH TWICE DAILY WITH A MEAL   Multiple  Vitamin (MULTIVITAMIN) tablet Take 1 tablet by mouth daily.   rizatriptan (MAXALT) 10 MG tablet Take 1 tablet (10 mg total) by mouth daily as needed (for migraine pain).   [DISCONTINUED] atenolol (TENORMIN) 100 MG tablet Take 1 tablet (100 mg total) by mouth daily.   [DISCONTINUED] insulin aspart (NOVOLOG) 100 UNIT/ML injection Inject 6 Units into the skin 3 (three) times daily before meals.   No facility-administered encounter medications on file as of 09/21/2020.     Review of Systems  Constitutional:  Negative for chills and fever.  HENT:  Negative for congestion, rhinorrhea and sore throat.   Respiratory:  Negative for cough, shortness of breath and wheezing.   Cardiovascular:  Negative for chest pain and leg swelling.  Gastrointestinal:  Negative for abdominal pain, diarrhea, nausea and vomiting.  Genitourinary:  Negative for dysuria and frequency.  Musculoskeletal:  Negative for arthralgias and back pain.  Skin:  Negative for rash.  Neurological:  Negative for dizziness, weakness and headaches.    Vitals Temp 98.2 F (36.8 C)   Ht '5\' 2"'  (1.575 m)   Wt 232 lb (105.2 kg)   SpO2 98%   BMI 42.43 kg/m   Objective:   Physical Exam Vitals and nursing note reviewed.  Constitutional:      Appearance: Normal appearance.  HENT:     Head: Normocephalic and atraumatic.     Nose: Nose normal.     Mouth/Throat:  Mouth: Mucous membranes are moist.     Pharynx: Oropharynx is clear.  Eyes:     Extraocular Movements: Extraocular movements intact.     Conjunctiva/sclera: Conjunctivae normal.     Pupils: Pupils are equal, round, and reactive to light.  Cardiovascular:     Rate and Rhythm: Normal rate and regular rhythm.     Pulses: Normal pulses.     Heart sounds: Normal heart sounds.  Pulmonary:     Effort: Pulmonary effort is normal.     Breath sounds: Normal breath sounds. No wheezing, rhonchi or rales.  Musculoskeletal:        General: Normal range of motion.     Right  lower leg: No edema.     Left lower leg: No edema.  Skin:    General: Skin is warm and dry.     Findings: No lesion or rash.  Neurological:     General: No focal deficit present.     Mental Status: She is alert and oriented to person, place, and time.  Psychiatric:        Mood and Affect: Mood normal.        Behavior: Behavior normal.     Assessment and Plan   1. Hypertension, unspecified type - lisinopril (ZESTRIL) 20 MG tablet; Take 1 tablet (20 mg total) by mouth daily.  Dispense: 90 tablet; Refill: 1  2. Controlled type 2 diabetes mellitus with complication, with long-term current use of insulin ([place])  3. Mixed hyperlipidemia  4. Contusion of left breast, initial encounter   Htn- suboptimal.  Last visit gave 3 months to see if could get bp donw.  Still elevated.  Will start lisinopril 15m in am to help with bp, since atenolol at night. Seeing endo- for diabetes.   Hasn't had mammogram, pap and colonoscopy. Pt declining for now. Pt is self pay and stating not having the money for the additional testing. - Bruise of left breast- Noticing a small knot on the left- had bruise on breast.  Advising pt to call or rto if worsening.  Pt needing a mammogram but not able to afford it at this time since doesn't have insurance. F/u in 2-3 weeks if the nodule hasn't resolved.  Return in about 4 months (around 01/22/2021) for f/u dm2, htn, hld.

## 2020-09-21 NOTE — Progress Notes (Deleted)
   Subjective:    Patient ID: Jeanette Pierce, female    DOB: [birth year], 55 y.o.   MRN: [id number]  HPI    Review of Systems     Objective:   Physical Exam        Assessment & Plan:

## 2020-09-25 ENCOUNTER — Telehealth: Payer: Self-pay | Admitting: Family Medicine

## 2020-09-25 NOTE — Telephone Encounter (Signed)
Pt contacted and verbalized understanding.  

## 2020-09-25 NOTE — Telephone Encounter (Signed)
Patient was put on new blood pressure medication on 7/28 at her visit she wanted to know if she needs to stop old medication. Please advise

## 2020-09-25 NOTE — Telephone Encounter (Signed)
Please advise. Thank you

## 2020-09-30 ENCOUNTER — Other Ambulatory Visit: Payer: Self-pay | Admitting: Family Medicine

## 2020-10-13 ENCOUNTER — Ambulatory Visit: Payer: Self-pay | Admitting: Internal Medicine

## 2020-10-16 ENCOUNTER — Other Ambulatory Visit: Payer: Self-pay | Admitting: Internal Medicine

## 2020-11-15 ENCOUNTER — Other Ambulatory Visit: Payer: Self-pay

## 2020-11-15 ENCOUNTER — Ambulatory Visit (INDEPENDENT_AMBULATORY_CARE_PROVIDER_SITE_OTHER): Payer: Self-pay | Admitting: Internal Medicine

## 2020-11-15 VITALS — BP 146/90 | HR 76 | Ht 62.0 in | Wt 223.6 lb

## 2020-11-15 DIAGNOSIS — E1165 Type 2 diabetes mellitus with hyperglycemia: Secondary | ICD-10-CM

## 2020-11-15 DIAGNOSIS — E785 Hyperlipidemia, unspecified: Secondary | ICD-10-CM

## 2020-11-15 LAB — POCT GLYCOSYLATED HEMOGLOBIN (HGB A1C): Hemoglobin A1C: 7.1 % — AB (ref 4.0–5.6)

## 2020-11-15 MED ORDER — ATORVASTATIN CALCIUM 10 MG PO TABS: 10.0000 mg | ORAL_TABLET | Freq: Every day | ORAL | 3 refills | Status: DC

## 2020-11-15 NOTE — Progress Notes (Signed)
Name: Jeanette Pierce  Age/ Sex: 55 y.o., female   MRN/ DOB: [id number], 23-May-1965     PCP: Jeanette Colla, DO   Reason for Endocrinology Evaluation: Type 2 Diabetes Mellitus  Initial Endocrine Consultative Visit: 10/28/2018    PATIENT IDENTIFIER: Jeanette Pierce is a 55 y.o. female with a past medical history of T2DM. The patient has followed with Endocrinology clinic since 10/28/2018 for consultative assistance with management of her diabetes.  DIABETIC HISTORY:  Jeanette Pierce was diagnosed with DM in 2019, pt had a prescription for metformin but she admits to not taking it, she was eventually started on insulin 09/2018 during hospitalization for DKA. Her hemoglobin A1c has ranged from 6.2% in 2019, peaking at > 15.5% in 2020.   On her initial presentation to our clinic she was on metformin, lantus and novolog with an A1c > 15.5%   We stopped her MDI regimen by 03/2019 with an A1c of 5.9%  Restarted insulin by 09/2019 after hospitalization for COVID  SUBJECTIVE:   During the last visit (04/14/2020): A1c 6.8 %, continued  Metformin and Lantus       Today (11/15/2020): Jeanette Pierce is here for a  follow up on diabetes.  She checks her blood sugars 1 times daily  She has ran out last month due to cost She is unable to increase Metformin due to hair loss  Denies nausea, vomiting or diarrhea   She did develop vomiting due to lisinopril     HOME DIABETES REGIMEN:  Metformin 500 mg 2 tabs daily  Lantus 20 units daily  CF: Novolog ( BG-130/40)    METER DOWNLOAD SUMMARY: 9/7-9/21/2022 Average Number Tests/Day = 1 Overall Mean FS Glucose = 137 Standard Deviation = 14  BG Ranges: Low = 106 High = 160  Hypoglycemic Events/30 Days: BG < 50 = 0 Episodes of symptomatic severe hypoglycemia = 0   DIABETIC COMPLICATIONS: Microvascular complications:    Denies: CKD, retinopathy, neuropathy  Last eye exam: Completed    Macrovascular complications:    Denies: CAD, PVD,  CVA      HISTORY:  Past Medical History:  Past Medical History:  Diagnosis Date   Diabetes mellitus without complication ([place])    Hypertension    Migraine headache    Reflux    Past Surgical History:  Past Surgical History:  Procedure Laterality Date   CHOLECYSTECTOMY  before [place]   Social History:  reports that she has never smoked. She has never used smokeless tobacco. She reports that she does not currently use alcohol. She reports that she does not currently use drugs. Family History:  Family History  Problem Relation Age of Onset   Heart disease Mother    COPD Father    Asthma Father    Cancer Maternal Grandfather        colon and stomach     HOME MEDICATIONS: Allergies as of 11/15/2020       Reactions   Erythromycin Nausea And Vomiting   Nausea, vomiting        Medication List        Accurate as of November 15, 2020  9:30 AM. If you have any questions, ask your nurse or doctor.          atenolol 100 MG tablet Commonly known as: TENORMIN Take 1 tablet by mouth once daily   atorvastatin 10 MG tablet Commonly known as: LIPITOR Take 1  tablet (10 mg total) by mouth daily.   blood glucose meter kit and supplies Dispense based on patient and insurance preference. Use up to four times daily as directed. (FOR ICD-10 E10.9, E11.9). What changed: additional instructions   ibuprofen 800 MG tablet Commonly known as: ADVIL Take 1 tablet (800 mg total) by mouth every 8 (eight) hours as needed.   imipramine 25 MG tablet Commonly known as: TOFRANIL Take 4 tablets (100 mg total) by mouth at bedtime.   insulin glargine 100 UNIT/ML injection Commonly known as: LANTUS Inject 20 Units into the skin daily.   lisinopril 20 MG tablet Commonly known as: ZESTRIL Take 1 tablet (20 mg total) by mouth daily.   metFORMIN 500 MG 24 hr tablet Commonly known as: GLUCOPHAGE-XR TAKE 2 TABLETS BY MOUTH TWICE DAILY WITH A  MEAL   multivitamin tablet Take 1 tablet by mouth daily.   rizatriptan 10 MG tablet Commonly known as: MAXALT Take 1 tablet (10 mg total) by mouth daily as needed (for migraine pain).   VITAMIN C PO Take by mouth.         OBJECTIVE:   Vital Signs: BP (!) 146/90 (BP Location: Left Arm, Patient Position: Sitting, Cuff Size: Small)   Pulse 76   Ht '5\' 2"'  (1.575 m)   Wt 223 lb 9.6 oz (101.4 kg)   SpO2 99%   BMI 40.90 kg/m   Wt Readings from Last 3 Encounters:  11/15/20 223 lb 9.6 oz (101.4 kg)  09/21/20 232 lb (105.2 kg)  06/22/20 230 lb (104.3 kg)     Exam: General: Pt appears well and is in NAD  Lungs: Clear with good BS bilat with no rales, rhonchi, or wheezes  Heart: RRR with normal S1 and S2 and no gallops; no murmurs; no rub  Abdomen: Normoactive bowel sounds, soft, nontender, without masses or organomegaly palpable  Extremities: No pretibial edema.   Neuro: MS is good with appropriate affect, pt is alert and Ox3    DM foot exam: 11/15/2020  The skin of the feet is intact without sores or ulcerations. The pedal pulses are 2+ on right and 2+ on left. The sensation is intact to a screening 5.07, 10 gram monofilament bilaterally    DATA REVIEWED:  Lab Results  Component Value Date   HGBA1C 7.1 (A) 11/15/2020   HGBA1C 6.8 (A) 04/14/2020   HGBA1C 6.9 (A) 12/10/2019   Lab Results  Component Value Date   MICROALBUR 4.1 (H) 04/08/2019   LDLCALC 57 04/14/2020   CREATININE 0.92 10/24/2019   Results for Jeanette Pierce (MRN [id number]) as of 04/17/2020 13:27  Ref. Range 04/14/2020 11:03  Total CHOL/HDL Ratio Unknown 3  Cholesterol Latest Ref Range: 0 - 200 mg/dL 149  HDL Cholesterol Latest Ref Range: >39.00 mg/dL 54.50  LDL (calc) Latest Ref Range: 0 - 99 mg/dL 57  NonHDL Unknown 94.04  Triglycerides Latest Ref Range: 0.0 - 149.0 mg/dL 183.0 (H)  VLDL Latest Ref Range: 0.0 - 40.0 mg/dL 36.6  TSH Latest Ref Range: 0.35 - 4.50 uIU/mL 2.09   ASSESSMENT / PLAN  / RECOMMENDATIONS:   1) Type 2 Diabetes Mellitus, Optimally controlled, Without complications - Most recent A1c of 7.1  %. Goal A1c < 7.0 %.     - A1c slightly elevated  - She had ran out of Lantus, unable to afford it anymore, pt assistance papers were provided for [employer]  - She takes 2 tablets of metformin daily instead of 4 as she attributes hair  loss to Metformin  - She was provided with lantus samples, to get her though until she does the paper work    MEDICATIONS:  -Continue Metformin 500 mg 2 tabs daily  - Increase Lantus  22 units daily    EDUCATION / INSTRUCTIONS: BG monitoring instructions: Patient is instructed to check her blood sugars 1 times a day , fasting  Call [facility] if: BG persistently < 70  I reviewed the Rule of 15 for the treatment of hypoglycemia in detail with the patient. Literature supplied.   2.Hypertriglyceridemia:     - She was started on Atorvastatin 03/2019 due to elevated Triglycerides 220 mg/dL  - Tolerating well, will refill     Medication Continue Atorvastatin 10 mg daily     F/U in 6 months    Signed electronically by: Mack Guise, MD  [facility]  [facility] [address] [place], [place] [address] Phone: [phone] FAX: [phone]   CC: Irena Reichmann [address] [place] [address] Phone: [phone]  Fax: [phone]  Return to Endocrinology clinic as below: Future Appointments  Date Time Provider [place]  11/15/2020 10:10 AM Everlie Eble, Melanie Crazier, MD [id number] None

## 2020-11-15 NOTE — Patient Instructions (Signed)
-   Continue Metformin 500 mg, 2 tablets with supper - Restart  Lantus  22 units daily   HOW TO TREAT LOW BLOOD SUGARS (Blood sugar LESS THAN 70 MG/DL) Please follow the RULE OF 15 for the treatment of hypoglycemia treatment (when your (blood sugars are less than 70 mg/dL)   STEP 1: Take 15 grams of carbohydrates when your blood sugar is low, which includes:  3-4 GLUCOSE TABS  OR 3-4 OZ OF JUICE OR REGULAR SODA OR ONE TUBE OF GLUCOSE GEL    STEP 2: RECHECK blood sugar in 15 MINUTES STEP 3: If your blood sugar is still low at the 15 minute recheck --> then, go back to STEP 1 and treat AGAIN with another 15 grams of carbohydrates.

## 2020-11-29 ENCOUNTER — Other Ambulatory Visit: Payer: Self-pay | Admitting: Family Medicine

## 2020-12-31 ENCOUNTER — Other Ambulatory Visit: Payer: Self-pay | Admitting: Family Medicine

## 2021-01-07 ENCOUNTER — Other Ambulatory Visit: Payer: Self-pay | Admitting: Family Medicine

## 2021-02-05 ENCOUNTER — Other Ambulatory Visit: Payer: Self-pay | Admitting: Internal Medicine

## 2021-02-05 ENCOUNTER — Other Ambulatory Visit: Payer: Self-pay | Admitting: Family Medicine

## 2021-02-09 ENCOUNTER — Other Ambulatory Visit: Payer: Self-pay | Admitting: Family Medicine

## 2021-02-20 ENCOUNTER — Other Ambulatory Visit: Payer: Self-pay | Admitting: Nurse Practitioner

## 2021-03-02 IMAGING — US ULTRASOUND ABDOMEN LIMITED
1 series · 14 of 25 positions shown · non-contrast
Comparison: Report for CT abdomen/pelvis 12/14/1998 (images
unavailable).

CLINICAL DATA: Elevated liver enzymes

EXAM:
ULTRASOUND ABDOMEN LIMITED RIGHT UPPER QUADRANT

[Series 1: ultrasound abdomen limited · 14 of 43 slices shown]
[im 1/43]
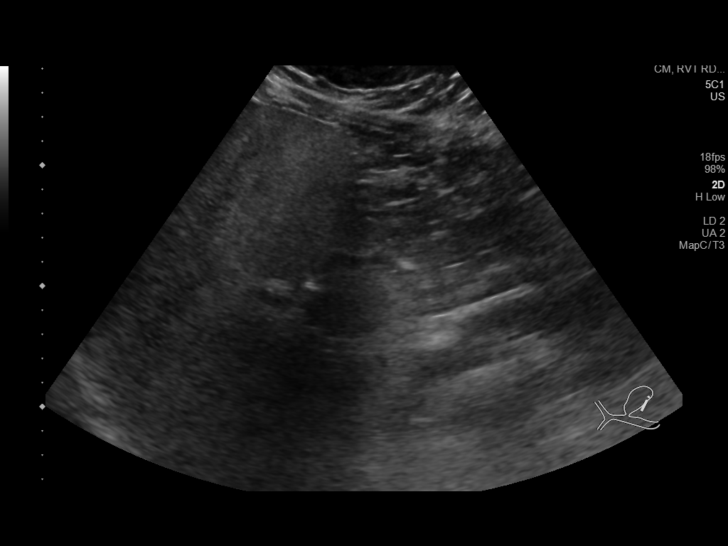
[im 4/43]
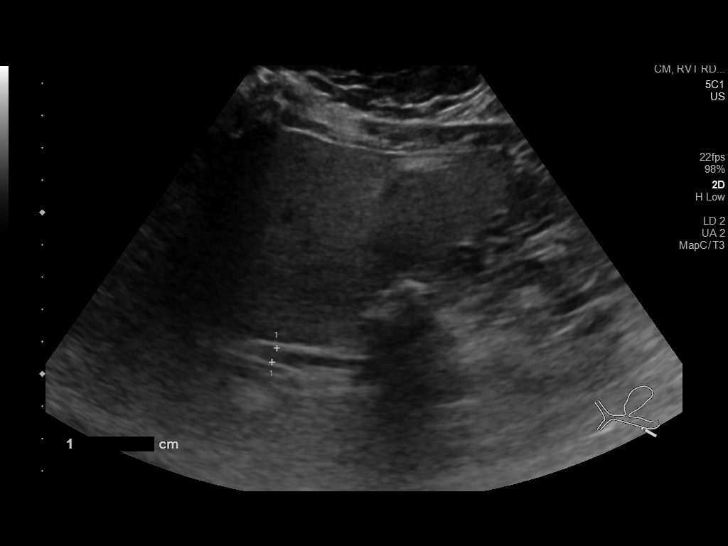
[im 8/43]
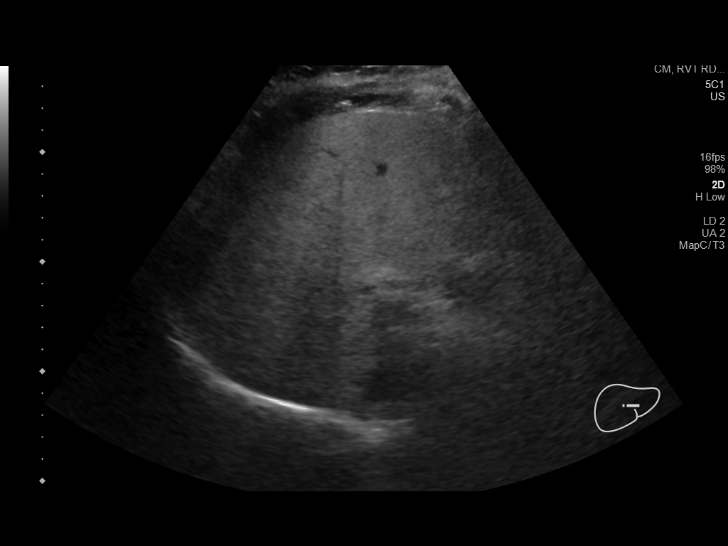
[im 11/43]
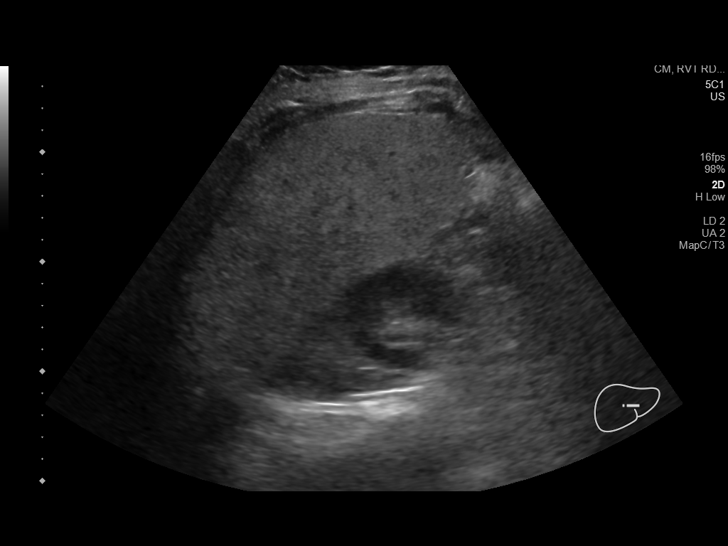
[im 15/43]
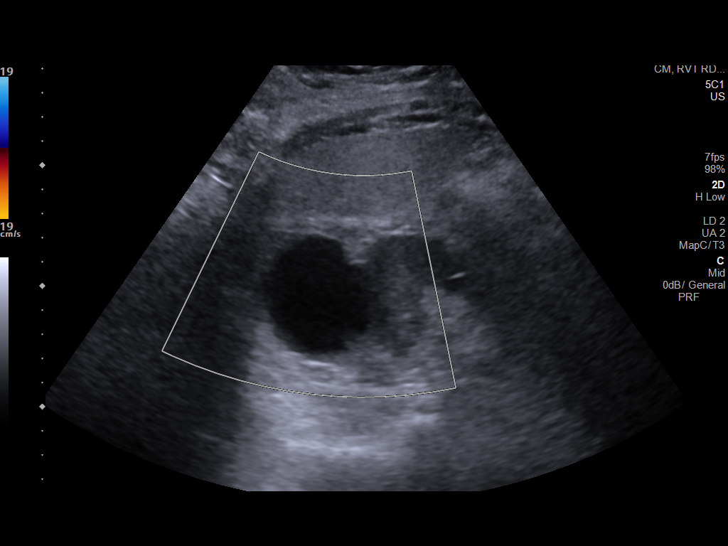
[im 16/43]
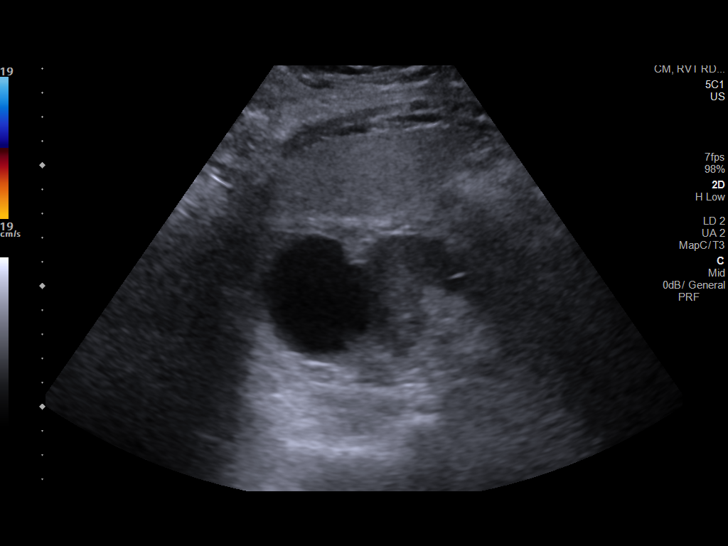
[im 20/43]
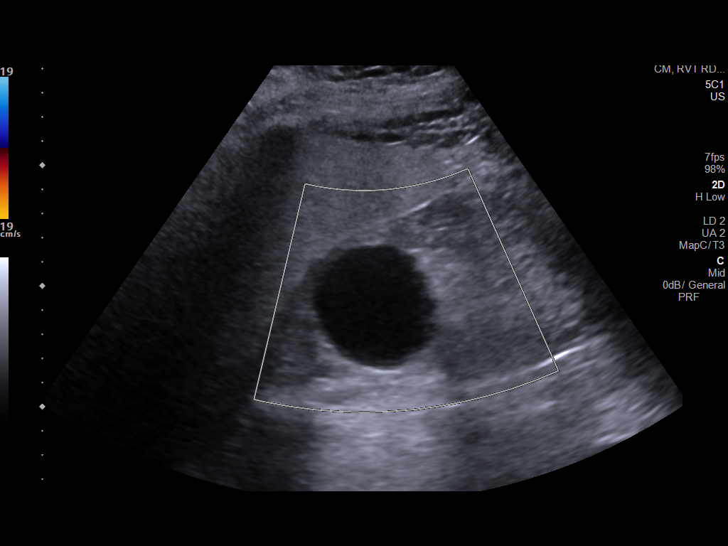
[im 23/43]
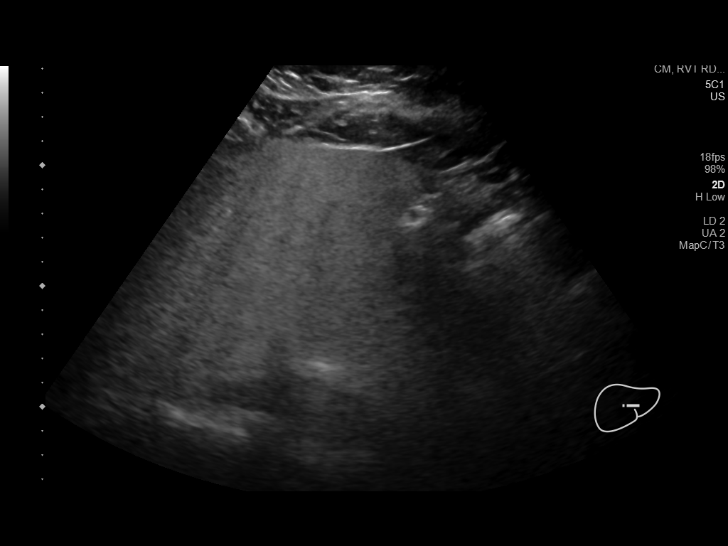
[im 27/43]
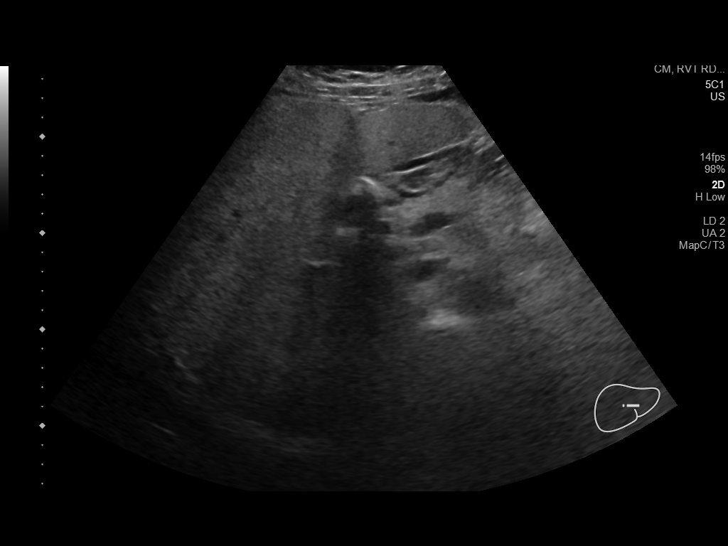
[im 29/43]
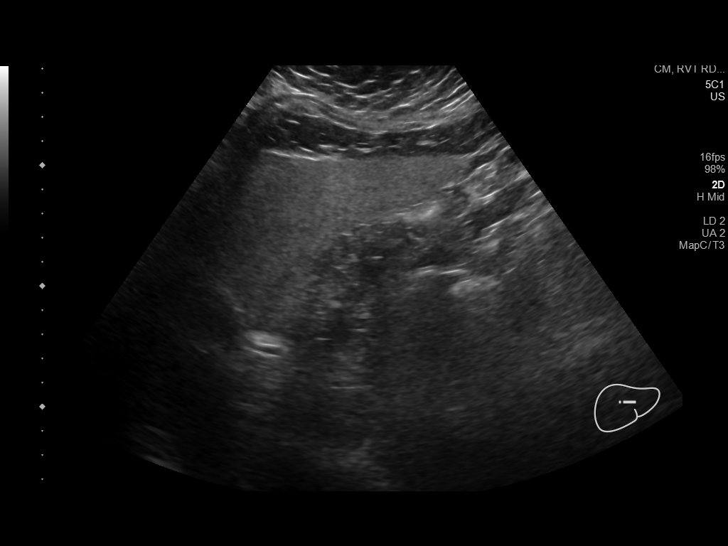
[im 32/43]
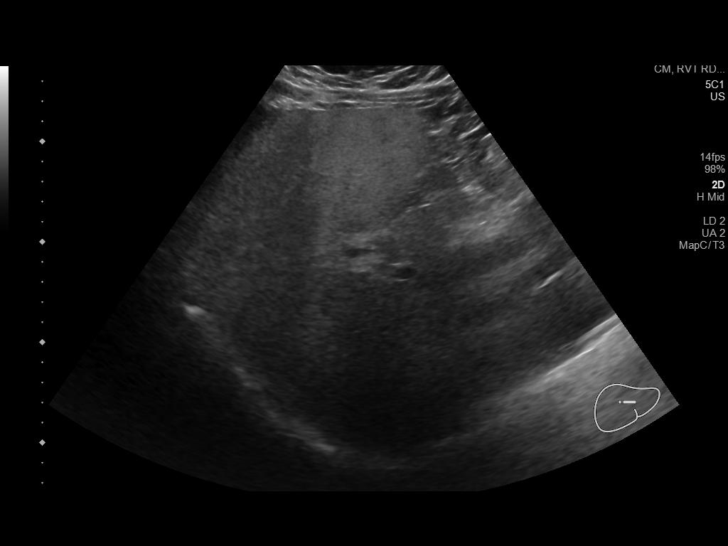
[im 36/43]
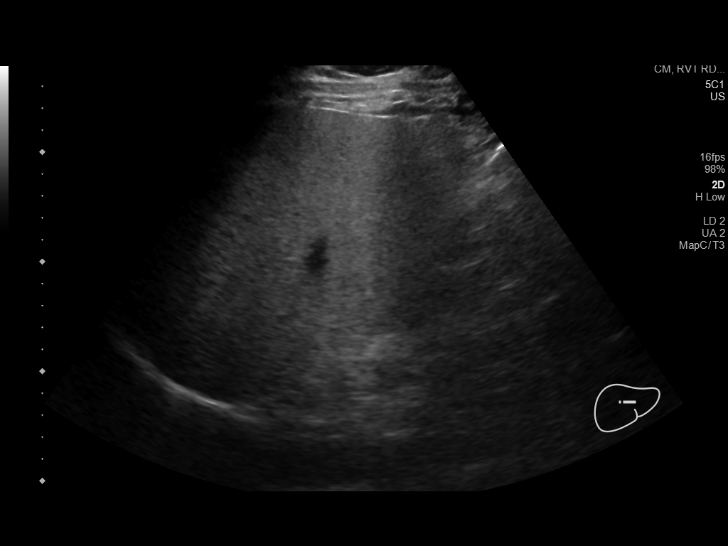
[im 39/43]
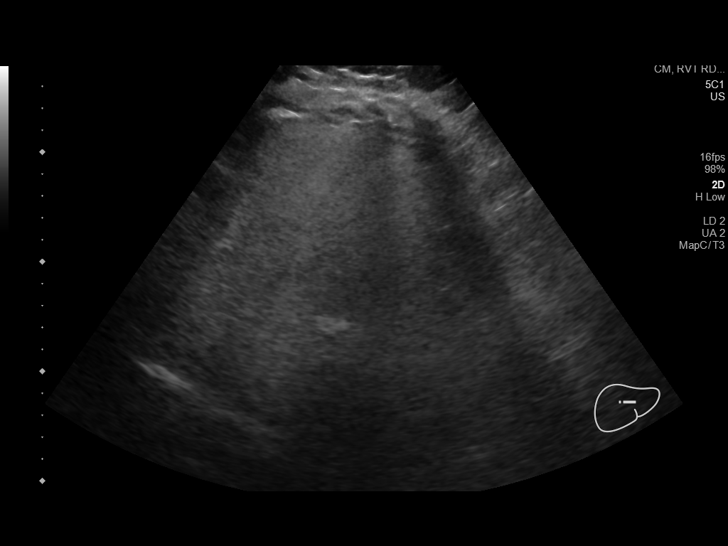
[im 43/43]
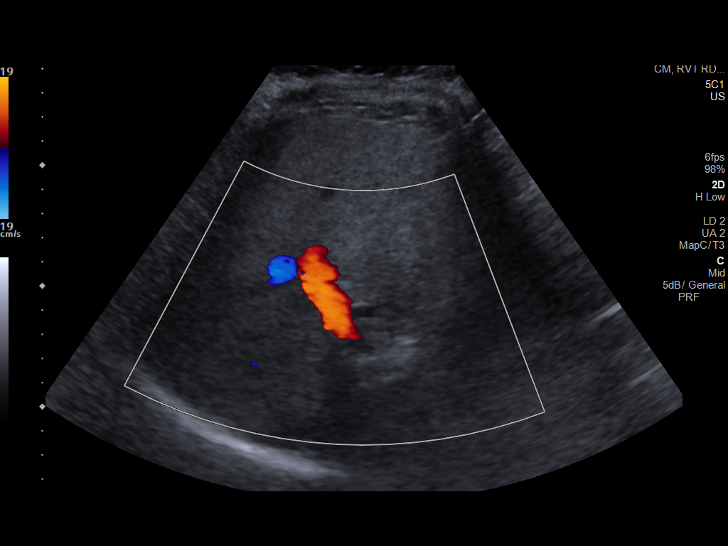

[14 of 25 positions shown; findings below may reference images not displayed]

FINDINGS: Gallbladder:

Patient reportedly status post cholecystectomy.

Common bile duct:

Diameter: 0.5 cm

Liver:

No focal lesion identified. Diffusely increased echogenicity of the
liver parenchyma. The interrogated main portal vein is patent on
color Doppler imaging with normal direction of blood flow toward the
liver.

Other: Incidentally noted 4.5 x 5 x 5.1 cm cyst arising from the
upper pole of the right kidney.
IMPRESSION: Diffusely increased echogenicity of the liver parenchyma. Findings
may be seen in the setting of steatosis or fibrosis. Consider
ultrasound elastography for further evaluation.

Incidentally noted 5.1 cm right upper pole renal cyst.

## 2021-03-07 ENCOUNTER — Telehealth: Payer: Self-pay

## 2021-03-08 NOTE — Telephone Encounter (Signed)
[employer] patient assistance form faxed and confirmed receipt.

## 2021-03-25 ENCOUNTER — Other Ambulatory Visit: Payer: Self-pay | Admitting: Nurse Practitioner

## 2021-04-09 ENCOUNTER — Telehealth: Payer: Self-pay

## 2021-04-09 NOTE — Telephone Encounter (Signed)
Patient advised that [facility] patient assistance has been approved for 12 month period. Approval received through fax.

## 2021-05-04 ENCOUNTER — Other Ambulatory Visit: Payer: Self-pay | Admitting: Nurse Practitioner

## 2021-05-16 ENCOUNTER — Ambulatory Visit (INDEPENDENT_AMBULATORY_CARE_PROVIDER_SITE_OTHER): Payer: Self-pay | Admitting: Family Medicine

## 2021-05-16 ENCOUNTER — Other Ambulatory Visit: Payer: Self-pay

## 2021-05-16 VITALS — BP 140/82 | HR 81 | Temp 98.2°F | Ht 62.0 in | Wt 208.4 lb

## 2021-05-16 DIAGNOSIS — G43109 Migraine with aura, not intractable, without status migrainosus: Secondary | ICD-10-CM

## 2021-05-16 DIAGNOSIS — Z794 Long term (current) use of insulin: Secondary | ICD-10-CM

## 2021-05-16 DIAGNOSIS — I10 Essential (primary) hypertension: Secondary | ICD-10-CM

## 2021-05-16 DIAGNOSIS — Z6838 Body mass index (BMI) 38.0-38.9, adult: Secondary | ICD-10-CM

## 2021-05-16 DIAGNOSIS — E119 Type 2 diabetes mellitus without complications: Secondary | ICD-10-CM

## 2021-05-16 DIAGNOSIS — E669 Obesity, unspecified: Secondary | ICD-10-CM | POA: Insufficient documentation

## 2021-05-16 DIAGNOSIS — L304 Erythema intertrigo: Secondary | ICD-10-CM | POA: Insufficient documentation

## 2021-05-16 DIAGNOSIS — E782 Mixed hyperlipidemia: Secondary | ICD-10-CM

## 2021-05-16 DIAGNOSIS — K219 Gastro-esophageal reflux disease without esophagitis: Secondary | ICD-10-CM | POA: Insufficient documentation

## 2021-05-16 MED ORDER — LISINOPRIL 20 MG PO TABS: 20.0000 mg | ORAL_TABLET | Freq: Every day | ORAL | 3 refills | Status: DC

## 2021-05-16 MED ORDER — ATENOLOL 100 MG PO TABS: 100.0000 mg | ORAL_TABLET | Freq: Every day | ORAL | 3 refills | Status: DC

## 2021-05-16 MED ORDER — IMIPRAMINE HCL 50 MG PO TABS
100.0000 mg | ORAL_TABLET | Freq: Every day | ORAL | 3 refills | Status: DC
Start: 2021-05-16 — End: 2021-11-16

## 2021-05-16 MED ORDER — ATORVASTATIN CALCIUM 10 MG PO TABS: 10.0000 mg | ORAL_TABLET | Freq: Every day | ORAL | 3 refills | Status: DC

## 2021-05-16 MED ORDER — RIZATRIPTAN BENZOATE 10 MG PO TABS: 10.0000 mg | ORAL_TABLET | Freq: Every day | ORAL | 5 refills | Status: DC | PRN

## 2021-05-16 MED ORDER — CLOTRIMAZOLE 1 % EX OINT
1.0000 | TOPICAL_OINTMENT | Freq: Two times a day (BID) | CUTANEOUS | 3 refills | Status: DC
Start: 2021-05-16 — End: 2022-05-17

## 2021-05-16 MED ORDER — METFORMIN HCL ER 500 MG PO TB24: ORAL_TABLET | ORAL | 3 refills | Status: DC

## 2021-05-16 NOTE — Assessment & Plan Note (Signed)
Patient has upcoming follow-up with endocrinology.  Recommended laboratory studies including A1c.  Continue current dosing of insulin as well as metformin. ?

## 2021-05-16 NOTE — Assessment & Plan Note (Signed)
Stable.  Lisinopril refilled. ?

## 2021-05-16 NOTE — Assessment & Plan Note (Signed)
Stable/well-controlled.  Continue imipramine and atenolol.  Maxalt as needed. ?

## 2021-05-16 NOTE — Progress Notes (Signed)
? ?Subjective:  ?Patient ID: Jeanette Pierce, female    DOB: [birth year]  Age: 56 y.o. MRN: [id number] ? ?CC: ?Chief Complaint  ?Patient presents with  ? 6 month follow up on medications  ? Rash  ?  On stomach. Gets really red and itchy.  ? ? ?HPI: ? ?56 year old female with obesity, diabetes, hyperlipidemia, hypertension, migraine presents for follow-up. ? ?Patient's diabetes is followed by endocrinology.  Last A1c was 7.1.  She is currently on a Basaglar 22 units daily as well as metformin.  She reports that her blood sugars are recently been elevated.  She attributes this to over-the-counter medications related to a recent cold.  Needs labs and A1c.  Has not had an A1c since September 2022. ? ?Hyperlipidemia has been well controlled.  She is currently on Lipitor.  Needs lipid panel. ? ?Patient has migraine headaches.  She has been doing well on imipramine and atenolol for prophylaxis.  She takes Maxalt for acute migraine.  Needs refills. ? ?Hypertension has been stable. ? ?Patient reports that she has an ongoing rash which she has had intermittently for the past year.  Located underneath the pannus particular on the left side.  She states that it is currently improved. ? ?Patient Active Problem List  ? Diagnosis Date Noted  ? GERD (gastroesophageal reflux disease) 05/16/2021  ? Mixed hyperlipidemia 05/16/2021  ? Type 2 diabetes mellitus (HCC) 05/16/2021  ? Obesity 05/16/2021  ? Intertrigo 05/16/2021  ? Hypertension   ? Migraine headache 09/16/2012  ? ? ?Social Hx   ?Social History  ? ?Socioeconomic History  ? Marital status: Married  ?  Spouse name: Not on file  ? Number of children: Not on file  ? Years of education: Not on file  ? Highest education level: Not on file  ?Occupational History  ? Not on file  ?Tobacco Use  ? Smoking status: Never  ? Smokeless tobacco: Never  ?Substance and Sexual Activity  ? Alcohol use: Not Currently  ? Drug use: Not Currently  ? Sexual activity: Not on file  ?Other Topics Concern  ?  Not on file  ?Social History Narrative  ? Not on file  ? ?Social Determinants of Health  ? ?Financial Resource Strain: Not on file  ?Food Insecurity: Not on file  ?Transportation Needs: Not on file  ?Physical Activity: Not on file  ?Stress: Not on file  ?Social Connections: Not on file  ? ? ?Review of Systems ?Per HPI ? ?Objective:  ?BP 140/82   Pulse 81   Temp 98.2 ?F (36.8 ?C) (Oral)   Ht 5\' 2"  (1.575 m)   Wt 208 lb 6.4 oz (94.5 kg)   SpO2 97%   BMI 38.12 kg/m?  ? ? ?  05/16/2021  ?  9:47 AM 11/15/2020  ?  9:21 AM 09/21/2020  ? 10:21 AM  ?BP/Weight  ?Systolic BP 140 146   ?Diastolic BP 82 90   ?Wt. (Lbs) 208.4 223.6 232  ?BMI 38.12 kg/m2 40.9 kg/m2 42.43 kg/m2  ? ? ?Physical Exam ?Vitals and nursing note reviewed.  ?Constitutional:   ?   General: She is not in acute distress. ?   Appearance: Normal appearance. She is not ill-appearing.  ?HENT:  ?   Head: Normocephalic and atraumatic.  ?Eyes:  ?   General:     ?   Right eye: No discharge.     ?   Left eye: No discharge.  ?   Conjunctiva/sclera: Conjunctivae normal.  ?Cardiovascular:  ?  Rate and Rhythm: Normal rate and regular rhythm.  ?Pulmonary:  ?   Effort: Pulmonary effort is normal.  ?   Breath sounds: Normal breath sounds. No wheezing, rhonchi or rales.  ?Abdominal:  ?   General: There is no distension.  ?   Palpations: Abdomen is soft.  ?   Tenderness: There is no abdominal tenderness.  ?Skin: ?   Comments: Mild erythema noted underneath the pannus on the left side.  ?Neurological:  ?   Mental Status: She is alert.  ?Psychiatric:     ?   Mood and Affect: Mood normal.     ?   Behavior: Behavior normal.  ? ? ?Lab Results  ?Component Value Date  ? WBC 8.5 10/24/2019  ? HGB 13.9 10/24/2019  ? HCT 43.0 10/24/2019  ? PLT 305 10/24/2019  ? GLUCOSE 154 (H) 10/24/2019  ? CHOL 149 04/14/2020  ? TRIG 183.0 (H) 04/14/2020  ? HDL 54.50 04/14/2020  ? LDLDIRECT 100.0 04/08/2019  ? LDLCALC 57 04/14/2020  ? ALT 70 (H) 10/24/2019  ? AST 44 (H) 10/24/2019  ? NA 141  10/24/2019  ? K 4.0 10/24/2019  ? CL 105 10/24/2019  ? CREATININE 0.92 10/24/2019  ? BUN 21 (H) 10/24/2019  ? CO2 22 10/24/2019  ? TSH 2.09 04/14/2020  ? HGBA1C 7.1 (A) 11/15/2020  ? MICROALBUR 4.1 (H) 04/08/2019  ? ? ? ?Assessment & Plan:  ? ?Problem List Items Addressed This Visit   ? ?  ? Cardiovascular and Mediastinum  ? Hypertension (Chronic)  ?  Stable.  Lisinopril refilled. ?  ?  ? Relevant Medications  ? atenolol (TENORMIN) 100 MG tablet  ? atorvastatin (LIPITOR) 10 MG tablet  ? lisinopril (ZESTRIL) 20 MG tablet  ? Migraine headache  ?  Stable/well-controlled.  Continue imipramine and atenolol.  Maxalt as needed. ?  ?  ? Relevant Medications  ? atenolol (TENORMIN) 100 MG tablet  ? atorvastatin (LIPITOR) 10 MG tablet  ? imipramine (TOFRANIL) 50 MG tablet  ? lisinopril (ZESTRIL) 20 MG tablet  ? rizatriptan (MAXALT) 10 MG tablet  ?  ? Endocrine  ? Type 2 diabetes mellitus (HCC) - Primary  ?  Patient has upcoming follow-up with endocrinology.  Recommended laboratory studies including A1c.  Continue current dosing of insulin as well as metformin. ?  ?  ? Relevant Medications  ? Insulin Glargine (BASAGLAR KWIKPEN [place])  ? atorvastatin (LIPITOR) 10 MG tablet  ? metFORMIN (GLUCOPHAGE-XR) 500 MG 24 hr tablet  ? lisinopril (ZESTRIL) 20 MG tablet  ?  ? Musculoskeletal and Integument  ? Intertrigo  ?  Rx sent for topical clotrimazole. ?  ?  ?  ? Other  ? Mixed hyperlipidemia  ?  Has been stable.  Needs lipid panel.  Continue atorvastatin. ?  ?  ? Relevant Medications  ? atenolol (TENORMIN) 100 MG tablet  ? atorvastatin (LIPITOR) 10 MG tablet  ? lisinopril (ZESTRIL) 20 MG tablet  ? Obesity  ? Relevant Medications  ? Insulin Glargine (BASAGLAR KWIKPEN [place])  ? metFORMIN (GLUCOPHAGE-XR) 500 MG 24 hr tablet  ? ? ?Meds ordered this encounter  ?Medications  ? atenolol (TENORMIN) 100 MG tablet  ?  Sig: Take 1 tablet (100 mg total) by mouth daily.  ?  Dispense:  90 tablet  ?  Refill:  3  ? atorvastatin (LIPITOR) 10 MG tablet  ?   Sig: Take 1 tablet (10 mg total) by mouth daily.  ?  Dispense:  90 tablet  ?  Refill:  3  ? imipramine (TOFRANIL) 50 MG tablet  ?  Sig: Take 2 tablets (100 mg total) by mouth at bedtime.  ?  Dispense:  180 tablet  ?  Refill:  3  ? metFORMIN (GLUCOPHAGE-XR) 500 MG 24 hr tablet  ?  Sig: TAKE 2 TABLETS BY MOUTH TWICE DAILY WITH A MEAL  ?  Dispense:  360 tablet  ?  Refill:  3  ? lisinopril (ZESTRIL) 20 MG tablet  ?  Sig: Take 1 tablet (20 mg total) by mouth daily.  ?  Dispense:  90 tablet  ?  Refill:  3  ? rizatriptan (MAXALT) 10 MG tablet  ?  Sig: Take 1 tablet (10 mg total) by mouth daily as needed (for migraine pain).  ?  Dispense:  10 tablet  ?  Refill:  5  ? Clotrimazole 1 % OINT  ?  Sig: Apply 1 application. topically in the morning and at bedtime.  ?  Dispense:  56.7 g  ?  Refill:  3  ? ? ?Follow-up:  Return in about 6 months (around 11/16/2021). ? ?Everlene Other DO ?[facility] ? ?

## 2021-05-16 NOTE — Patient Instructions (Signed)
I have refilled your medications. ? ?Recommended labs - CBC, CMP, Lipid and A1C.  ? ?Follow up in 6 months. ? ?Take care ? ?Dr. Adriana Simas  ?

## 2021-05-16 NOTE — Assessment & Plan Note (Signed)
Rx sent for topical clotrimazole. ?

## 2021-05-16 NOTE — Assessment & Plan Note (Signed)
Has been stable.  Needs lipid panel.  Continue atorvastatin. ?

## 2021-05-18 ENCOUNTER — Ambulatory Visit (INDEPENDENT_AMBULATORY_CARE_PROVIDER_SITE_OTHER): Payer: Self-pay | Admitting: Internal Medicine

## 2021-05-18 ENCOUNTER — Other Ambulatory Visit: Payer: Self-pay

## 2021-05-18 ENCOUNTER — Encounter: Payer: Self-pay | Admitting: Internal Medicine

## 2021-05-18 VITALS — BP 124/76 | HR 84 | Ht 62.0 in | Wt 209.0 lb

## 2021-05-18 DIAGNOSIS — E1165 Type 2 diabetes mellitus with hyperglycemia: Secondary | ICD-10-CM

## 2021-05-18 DIAGNOSIS — E785 Hyperlipidemia, unspecified: Secondary | ICD-10-CM

## 2021-05-18 LAB — BASIC METABOLIC PANEL
BUN: 8 mg/dL (ref 6–23)
CO2: 30 mEq/L (ref 19–32)
Calcium: 9.2 mg/dL (ref 8.4–10.5)
Chloride: 105 mEq/L (ref 96–112)
Creatinine, Ser: 0.65 mg/dL (ref 0.40–1.20)
GFR: 100.62 mL/min (ref >60.00)
Glucose, Bld: 132 mg/dL — ABNORMAL HIGH (ref 70–99)
Potassium: 4.8 mEq/L (ref 3.5–5.1)
Sodium: 140 mEq/L (ref 135–145)

## 2021-05-18 LAB — LIPID PANEL
Cholesterol: 141 mg/dL (ref 0–200)
HDL: 50.4 mg/dL (ref >39.00)
LDL Cholesterol: 56 mg/dL (ref 0–99)
NonHDL: 90.72
Total CHOL/HDL Ratio: 3
Triglycerides: 175 mg/dL — ABNORMAL HIGH (ref 0.0–149.0)
VLDL: 35 mg/dL (ref 0.0–40.0)

## 2021-05-18 LAB — MICROALBUMIN / CREATININE URINE RATIO
Creatinine,U: 187.4 mg/dL
Microalb Creat Ratio: 10.5 mg/g (ref 0.0–30.0)
Microalb, Ur: 19.6 mg/dL — ABNORMAL HIGH (ref 0.0–1.9)

## 2021-05-18 LAB — POCT GLYCOSYLATED HEMOGLOBIN (HGB A1C): Hemoglobin A1C: 12.1 % — AB (ref 4.0–5.6)

## 2021-05-18 MED ORDER — GLIPIZIDE 5 MG PO TABS: 5.0000 mg | ORAL_TABLET | Freq: Two times a day (BID) | ORAL | 3 refills | Status: DC

## 2021-05-18 MED ORDER — METFORMIN HCL ER 500 MG PO TB24: ORAL_TABLET | ORAL | 3 refills | Status: DC

## 2021-05-18 NOTE — Patient Instructions (Addendum)
-   Continue Metformin 500 mg, 2 tablets with supper ?- Continue Basaglar  22 units daily  ?- Start Glipizide 5 mg , 1 tablet before first meal of the day and 1 tablet before last meal of the day  ? ? ?IF YOU START OZEMPIC : ?Stop Glipizide but continue insulin and Metformin  ?Start Ozempic 0.25 mg once weekly for 6 weeks, then increase to 0.5 mg once weekly  ? ? ? ? ? ? ?HOW TO TREAT LOW BLOOD SUGARS (Blood sugar LESS THAN 70 MG/DL) ?Please follow the RULE OF 15 for the treatment of hypoglycemia treatment (when your (blood sugars are less than 70 mg/dL)  ? ?STEP 1: Take 15 grams of carbohydrates when your blood sugar is low, which includes:  ?3-4 GLUCOSE TABS  OR ?3-4 OZ OF JUICE OR REGULAR SODA OR ?ONE TUBE OF GLUCOSE GEL   ? ?STEP 2: RECHECK blood sugar in 15 MINUTES ?STEP 3: If your blood sugar is still low at the 15 minute recheck --> then, go back to STEP 1 and treat AGAIN with another 15 grams of carbohydrates. ? ? ?

## 2021-05-18 NOTE — Progress Notes (Signed)
? ?Name: Jeanette Pierce  ?Age/ Sex: 56 y.o., female   ?MRN/ DOB: [id number], 06-Oct-1965    ? ?PCP: Tommie Sams, DO   ?Reason for Endocrinology Evaluation: Type 2 Diabetes Mellitus  ?Initial Endocrine Consultative Visit: 10/28/2018  ? ? ?PATIENT IDENTIFIER: Jeanette Pierce is a 56 y.o. female with a past medical history of T2DM. The patient has followed with Endocrinology clinic since 10/28/2018 for consultative assistance with management of her diabetes. ? ?DIABETIC HISTORY:  ?[name] was diagnosed with DM in 2019, pt had a prescription for metformin but she admits to not taking it, she was eventually started on insulin 09/2018 during hospitalization for DKA. Her hemoglobin A1c has ranged from 6.2% in 2019, peaking at > 15.5% in 2020. ? ? ?On her initial presentation to our clinic she was on metformin, lantus and novolog with an A1c > 15.5%  ? ?We stopped her MDI regimen by 03/2019 with an A1c of 5.9% ? ?Restarted insulin by 09/2019 after hospitalization for COVID ? ?SUBJECTIVE:  ? ?During the last visit (11/15/2020): A1c 7.1 %, continued  Metformin and increased Lantus  ? ? ? ? ? ?Today (05/18/2021): [name] is here for a  follow up on diabetes.  She checks her blood sugars 1 times daily ? ?She has been without insulin for ~ 2 months, restarted 2/27th when she received pt assistance insulin  ?She has been sick with variable URI and has been using OTC meds which caused hyperglycemia  ? ?She did develop vomiting due to lisinopril  ?She would like to try Ozempic ? ? ? ? ? ?HOME DIABETES REGIMEN:  ?Metformin 500 mg 2 tabs daily  ?Basaglar 22  units daily  ? ? ? ? ? ?METER DOWNLOAD SUMMARY: 2/25-3/24/2023 ?Average Number Tests/Day = 1 ?Overall Mean FS Glucose = 220 ?Standard Deviation = 67 ? ?BG Ranges: ?Low = 113 ?High = 375 ? ?Hypoglycemic Events/30 Days: ?BG < 50 = 0 ?Episodes of symptomatic severe hypoglycemia = 0 ? ? ?DIABETIC COMPLICATIONS: ?Microvascular complications:  ?  ?Denies: CKD, retinopathy, neuropathy   ?Last eye exam: Completed 2021 ?  ?Macrovascular complications:  ?  ?Denies: CAD, PVD, CVA ? ? ? ? ? ?HISTORY:  ?Past Medical History:  ?Past Medical History:  ?Diagnosis Date  ? Diabetes mellitus without complication (HCC)   ? Hypertension   ? Migraine headache   ? Reflux   ? ?Past Surgical History:  ?Past Surgical History:  ?Procedure Laterality Date  ? CHOLECYSTECTOMY  before 1999  ? iliocolotomy  1999  ? TUBAL LIGATION  1991  ? ?Social History:  reports that she has never smoked. She has never used smokeless tobacco. She reports that she does not currently use alcohol. She reports that she does not currently use drugs. ?Family History:  ?Family History  ?Problem Relation Age of Onset  ? Heart disease Mother   ? COPD Father   ? Asthma Father   ? Cancer Maternal Grandfather   ?     colon and stomach  ? ? ? ?HOME MEDICATIONS: ?Allergies as of 05/18/2021   ? ?   Reactions  ? Erythromycin Nausea And Vomiting  ? Nausea, vomiting  ? ?  ? ?  ?Medication List  ?  ? ?  ? Accurate as of May 18, 2021 10:29 AM. If you have any questions, ask your nurse or doctor.  ?  ?  ? ?  ? ?atenolol 100 MG tablet ?Commonly known as: TENORMIN ?Take 1 tablet (  100 mg total) by mouth daily. ?  ?atorvastatin 10 MG tablet ?Commonly known as: LIPITOR ?Take 1 tablet (10 mg total) by mouth daily. ?  [place] ?Inject 22 mLs into the skin. ?  ?Clotrimazole 1 % Oint ?Apply 1 application. topically in the morning and at bedtime. ?  ?glipiZIDE 5 MG tablet ?Commonly known as: GLUCOTROL ?Take 1 tablet (5 mg total) by mouth 2 (two) times daily before a meal. ?Started by: Scarlette Shorts, MD ?  ?ibuprofen 800 MG tablet ?Commonly known as: ADVIL ?TAKE 1 TABLET BY MOUTH EVERY 8 HOURS AS NEEDED ?  ?imipramine 50 MG tablet ?Commonly known as: TOFRANIL ?Take 2 tablets (100 mg total) by mouth at bedtime. ?  ?lisinopril 20 MG tablet ?Commonly known as: ZESTRIL ?Take 1 tablet (20 mg total) by mouth daily. ?  ?metFORMIN 500 MG 24 hr  tablet ?Commonly known as: GLUCOPHAGE-XR ?TAKE 2 TABLETS BY MOUTH TWICE DAILY WITH A MEAL ?  ?rizatriptan 10 MG tablet ?Commonly known as: MAXALT ?Take 1 tablet (10 mg total) by mouth daily as needed (for migraine pain). ?  ? ?  ? ? ? ?OBJECTIVE:  ? ?Vital Signs: BP 124/76 (BP Location: Left Arm, Patient Position: Sitting, Cuff Size: Large)   Pulse 84   Ht 5\' 2"  (1.575 m)   Wt 209 lb (94.8 kg)   SpO2 99%   BMI 38.23 kg/m?   ?Wt Readings from Last 3 Encounters:  ?05/18/21 209 lb (94.8 kg)  ?05/16/21 208 lb 6.4 oz (94.5 kg)  ?11/15/20 223 lb 9.6 oz (101.4 kg)  ? ? ? ?Exam: ?General: Pt appears well and is in NAD  ?Lungs: Clear with good BS bilat with no rales, rhonchi, or wheezes  ?Heart: RRR with normal S1 and S2 and no gallops; no murmurs; no rub  ?Abdomen: Normoactive bowel sounds, soft, nontender, without masses or organomegaly palpable  ?Extremities: No pretibial edema.   ?Neuro: MS is good with appropriate affect, pt is alert and Ox3  ? ? ?DM foot exam: 11/15/2020 ? ?The skin of the feet is intact without sores or ulcerations. ?The pedal pulses are 2+ on right and 2+ on left. ?The sensation is intact to a screening 5.07, 10 gram monofilament bilaterally ? ? ? ?DATA REVIEWED: ? ?Lab Results  ?Component Value Date  ? HGBA1C 12.1 (A) 05/18/2021  ? HGBA1C 7.1 (A) 11/15/2020  ? HGBA1C 6.8 (A) 04/14/2020  ? ? Latest Reference Range & Units 05/18/21 10:38  ?BASIC METABOLIC PANEL  Rpt !  ?Sodium 135 - 145 mEq/L 140  ?Potassium 3.5 - 5.1 mEq/L 4.8  ?Chloride 96 - 112 mEq/L 105  ?CO2 19 - 32 mEq/L 30  ?Glucose 70 - 99 mg/dL 05/20/21 (H)  ?BUN 6 - 23 mg/dL 8  ?Creatinine 0.40 - 1.20 mg/dL 229  ?Calcium 8.4 - 10.5 mg/dL 9.2  ?GFR >60.00 mL/min 100.62  ?Total CHOL/HDL Ratio  3  ?Cholesterol 0 - 200 mg/dL 7.98  ?HDL Cholesterol >39.00 mg/dL 921  ?LDL (calc) 0 - 99 mg/dL 56  ?MICROALB/CREAT RATIO 0.0 - 30.0 mg/g 10.5  ?NonHDL  90.72  ?Triglycerides 0.0 - 149.0 mg/dL 19.41 (H)  ?VLDL 0.0 - 40.0 mg/dL 740.8  ? ? Latest Reference  Range & Units 05/18/21 10:38  ?Creatinine,U mg/dL 05/20/21  ?Microalb, Ur 0.0 - 1.9 mg/dL 818.5 (H)  ?MICROALB/CREAT RATIO 0.0 - 30.0 mg/g 10.5  ? ? ? ?ASSESSMENT / PLAN / RECOMMENDATIONS:  ? ?1) Type 2 Diabetes Mellitus, poorly controlled, Without complications -  Most recent A1c of 12.1  %. Goal A1c < 7.0 %.   ? ?-Her A1c has trended up from 7.1% to 12.1%, she was without any insulin for approximately 2 months and was restarted on Basaglar after the shipment from [facility] was delivered on 04/23/2021.  ?-In review of her glucose meter download hyperglycemia had persisted up until a week ago, I again emphasized the importance of low-carb diet and avoiding snacks ?- She takes 2 tablets of metformin daily instead of 4 as she attributes hair loss to Metformin  ?-[place], she was provided with patient assistance papers today ?-I am going to start her on glipizide, she understands the risk of hypoglycemia in combination with insulin but due to lack of insurance this may be an affordable option ? ?MEDICATIONS: ? ?-Continue Metformin 500 mg 2 tabs daily  ?-Continue Basaglar 22 units daily  ?-Start glipizide 5 mg, 1 tablet twice daily ? ? ?If she receives Ozempic: ?She was advised to stop glipizide but continue metformin and Basaglar.  Start Ozempic 0.25 mg weekly for 6 weeks then increase to 0.5 mg weekly ? ? ?EDUCATION / INSTRUCTIONS: ?BG monitoring instructions: Patient is instructed to check her blood sugars 1 times a day , fasting  ?Call [facility] if: BG persistently < 70  ?I reviewed the Rule of 15 for the treatment of hypoglycemia in detail with the patient. Literature supplied. ? ? ?2.Hypertriglyceridemia:  ? ? ? ?- She was started on Atorvastatin 03/2019 due to elevated Triglycerides 220 mg/dL  ?-Lipid panel today shows LDL at goal, TG slightly elevated ? ? ? ?Medication ?Continue Atorvastatin 10 mg daily  ? ? ? ?F/U in 6 months  ? ? ?Signed electronically by: ?Abby Raelyn MoraJaralla  Jaloni Davoli, MD ? ?[facility]  ?[facility] ?[address] ?[place], [place] [address] ?Phone: [phone] ?FAX: [phone] ? ? ?CC: ?Tommie SamsCook, Jayce G, DO ?[address] ?[place]

## 2021-08-01 ENCOUNTER — Other Ambulatory Visit: Payer: Self-pay | Admitting: Family Medicine

## 2021-09-21 ENCOUNTER — Other Ambulatory Visit: Payer: Self-pay | Admitting: Family Medicine

## 2021-11-09 ENCOUNTER — Encounter: Payer: Self-pay | Admitting: Internal Medicine

## 2021-11-09 ENCOUNTER — Ambulatory Visit: Payer: Self-pay | Admitting: Internal Medicine

## 2021-11-09 NOTE — Progress Notes (Deleted)
Name: Jeanette Pierce  Age/ Sex: 56 y.o., female   MRN/ DOB: [id number], 1965/04/25     PCP: Coral Spikes, DO   Reason for Endocrinology Evaluation: Type 2 Diabetes Mellitus  Initial Endocrine Consultative Visit: 10/28/2018    PATIENT IDENTIFIER: Jeanette Pierce is a 56 y.o. female with a past medical history of T2DM. The patient has followed with Endocrinology clinic since 10/28/2018 for consultative assistance with management of her diabetes.  DIABETIC HISTORY:  Jeanette Pierce was diagnosed with DM in 2019, pt had a prescription for metformin but she admits to not taking it, she was eventually started on insulin 09/2018 during hospitalization for DKA. Her hemoglobin A1c has ranged from 6.2% in 2019, peaking at > 15.5% in 2020.   On her initial presentation to our clinic she was on metformin, lantus and novolog with an A1c > 15.5%   We stopped her MDI regimen by 03/2019 with an A1c of 5.9%  Restarted insulin by 09/2019 after hospitalization for COVID   Started glipizide in March 2023   SUBJECTIVE:   During the last visit (05/18/2021): A1c 12.1%, continued  Metformin and increased Lantus and started glipizide      Today (11/09/2021): Jeanette Pierce is here for a  follow up on diabetes.  She checks her blood sugars 1 times daily    Patient assistance Environmental health practitioner) February 2023    HOME DIABETES REGIMEN:  Metformin 500 mg 2 tabs daily  Basaglar 22  units daily  Glipizide 5 mg twice daily      METER DOWNLOAD SUMMARY: 2/25-3/24/2023 Average Number Tests/Day = 1 Overall Mean FS Glucose = 220 Standard Deviation = 67  BG Ranges: Low = 113 High = 375  Hypoglycemic Events/30 Days: BG < 50 = 0 Episodes of symptomatic severe hypoglycemia = 0   DIABETIC COMPLICATIONS: Microvascular complications:    Denies: CKD, retinopathy, neuropathy  Last eye exam: Completed 2021   Macrovascular complications:    Denies: CAD, PVD, CVA      HISTORY:  Past Medical History:  Past  Medical History:  Diagnosis Date   Diabetes mellitus without complication ([place])    Hypertension    Migraine headache    Reflux    Past Surgical History:  Past Surgical History:  Procedure Laterality Date   CHOLECYSTECTOMY  before [place]   Social History:  reports that she has never smoked. She has never used smokeless tobacco. She reports that she does not currently use alcohol. She reports that she does not currently use drugs. Family History:  Family History  Problem Relation Age of Onset   Heart disease Mother    COPD Father    Asthma Father    Cancer Maternal Grandfather        colon and stomach     HOME MEDICATIONS: Allergies as of 11/09/2021       Reactions   Erythromycin Nausea And Vomiting   Nausea, vomiting        Medication List        Accurate as of November 09, 2021  7:18 AM. If you have any questions, ask your nurse or doctor.          atenolol 100 MG tablet Commonly known as: TENORMIN Take 1 tablet (100 mg total) by mouth daily.   atorvastatin 10 MG tablet Commonly known as: LIPITOR Take 1 tablet (10 mg total) by mouth daily.   BASAGLAR KWIKPEN [place]  Inject 22 mLs into the skin.   Clotrimazole 1 % Oint Apply 1 application. topically in the morning and at bedtime.   glipiZIDE 5 MG tablet Commonly known as: GLUCOTROL Take 1 tablet (5 mg total) by mouth 2 (two) times daily before a meal.   ibuprofen 800 MG tablet Commonly known as: ADVIL TAKE 1 TABLET BY MOUTH EVERY 8 HOURS AS NEEDED   imipramine 50 MG tablet Commonly known as: TOFRANIL Take 2 tablets (100 mg total) by mouth at bedtime.   lisinopril 20 MG tablet Commonly known as: ZESTRIL Take 1 tablet (20 mg total) by mouth daily.   metFORMIN 500 MG 24 hr tablet Commonly known as: GLUCOPHAGE-XR TAKE 2 TABLETS BY MOUTH TWICE DAILY WITH A MEAL   rizatriptan 10 MG tablet Commonly known as: MAXALT Take 1 tablet (10 mg total) by mouth daily  as needed (for migraine pain).         OBJECTIVE:   Vital Signs: There were no vitals taken for this visit.  Wt Readings from Last 3 Encounters:  05/18/21 209 lb (94.8 kg)  05/16/21 208 lb 6.4 oz (94.5 kg)  11/15/20 223 lb 9.6 oz (101.4 kg)     Exam: General: Pt appears well and is in NAD  Lungs: Clear with good BS bilat with no rales, rhonchi, or wheezes  Heart: RRR with normal S1 and S2 and no gallops; no murmurs; no rub  Abdomen: Normoactive bowel sounds, soft, nontender, without masses or organomegaly palpable  Extremities: No pretibial edema.   Neuro: MS is good with appropriate affect, pt is alert and Ox3    DM foot exam: 11/15/2020  The skin of the feet is intact without sores or ulcerations. The pedal pulses are 2+ on right and 2+ on left. The sensation is intact to a screening 5.07, 10 gram monofilament bilaterally    DATA REVIEWED:  Lab Results  Component Value Date   HGBA1C 12.1 (A) 05/18/2021   HGBA1C 7.1 (A) 11/15/2020   HGBA1C 6.8 (A) 04/14/2020    Latest Reference Range & Units 05/18/21 10:38  BASIC METABOLIC PANEL  Rpt !  Sodium 135 - 145 mEq/L 140  Potassium 3.5 - 5.1 mEq/L 4.8  Chloride 96 - 112 mEq/L 105  CO2 19 - 32 mEq/L 30  Glucose 70 - 99 mg/dL 350 (H)  BUN 6 - 23 mg/dL 8  Creatinine 0.93 - 8.18 mg/dL 2.99  Calcium 8.4 - 37.1 mg/dL 9.2  GFR >69.67 mL/min 100.62  Total CHOL/HDL Ratio  3  Cholesterol 0 - 200 mg/dL 893  HDL Cholesterol >81.01 mg/dL 75.10  LDL (calc) 0 - 99 mg/dL 56  MICROALB/CREAT RATIO 0.0 - 30.0 mg/g 10.5  NonHDL  90.72  Triglycerides 0.0 - 149.0 mg/dL 258.5 (H)  VLDL 0.0 - 27.7 mg/dL 82.4    Latest Reference Range & Units 05/18/21 10:38  Creatinine,U mg/dL 235.3  Microalb, Ur 0.0 - 1.9 mg/dL 61.4 (H)  MICROALB/CREAT RATIO 0.0 - 30.0 mg/g 10.5     ASSESSMENT / PLAN / RECOMMENDATIONS:   1) Type 2 Diabetes Mellitus, poorly controlled, Without complications - Most recent A1c of 12.1  %. Goal A1c < 7.0 %.     -Her A1c has trended up from 7.1% to 12.1%, she was without any insulin for approximately 2 months and was restarted on Basaglar after the shipment from [facility] was delivered on 04/23/2021.  -In review of her glucose meter download hyperglycemia had persisted up until a week ago, I again emphasized  the importance of low-carb diet and avoiding snacks - She takes 2 tablets of metformin daily instead of 4 as she attributes hair loss to Metformin  -She is interested in [place], she was provided with patient assistance papers today -I am going to start her on glipizide, she understands the risk of hypoglycemia in combination with insulin but due to lack of insurance this may be an affordable option  MEDICATIONS:  -Continue Metformin 500 mg 2 tabs daily  -Continue Basaglar 22 units daily  -Start glipizide 5 mg, 1 tablet twice daily   If she receives Ozempic: She was advised to stop glipizide but continue metformin and Basaglar.  Start Ozempic 0.25 mg weekly for 6 weeks then increase to 0.5 mg weekly   EDUCATION / INSTRUCTIONS: BG monitoring instructions: Patient is instructed to check her blood sugars 1 times a day , fasting  Call [facility] if: BG persistently < 70  I reviewed the Rule of 15 for the treatment of hypoglycemia in detail with the patient. Literature supplied.   2.Hypertriglyceridemia:     - She was started on Atorvastatin 03/2019 due to elevated Triglycerides 220 mg/dL  -Lipid panel today shows LDL at goal, TG slightly elevated    Medication Continue Atorvastatin 10 mg daily     F/U in 6 months    Signed electronically by: Lyndle Herrlich, MD  [facility] Endocrinology  [facility] [address] [place] [place], [place] [address] Phone: [phone] FAX: [phone]   CC: Omni, [place], DO [address] [place] [address] Phone: [phone]  Fax: [phone]  Return to Endocrinology clinic as  below: Future Appointments  Date Time Provider Department Center  11/09/2021 10:10 AM Faye Strohman, Konrad Dolores, MD [facility] None  11/16/2021  9:40 AM Tommie Sams, DO [facility]

## 2021-11-16 ENCOUNTER — Encounter: Payer: Self-pay | Admitting: Family Medicine

## 2021-11-16 ENCOUNTER — Ambulatory Visit (INDEPENDENT_AMBULATORY_CARE_PROVIDER_SITE_OTHER): Payer: Self-pay | Admitting: Family Medicine

## 2021-11-16 VITALS — BP 138/86 | HR 76 | Temp 98.6°F | Wt 221.6 lb

## 2021-11-16 DIAGNOSIS — E782 Mixed hyperlipidemia: Secondary | ICD-10-CM

## 2021-11-16 DIAGNOSIS — I10 Essential (primary) hypertension: Secondary | ICD-10-CM

## 2021-11-16 DIAGNOSIS — Z794 Long term (current) use of insulin: Secondary | ICD-10-CM

## 2021-11-16 DIAGNOSIS — E1165 Type 2 diabetes mellitus with hyperglycemia: Secondary | ICD-10-CM

## 2021-11-16 DIAGNOSIS — G43109 Migraine with aura, not intractable, without status migrainosus: Secondary | ICD-10-CM

## 2021-11-16 MED ORDER — IMIPRAMINE HCL 25 MG PO TABS: 100.0000 mg | ORAL_TABLET | Freq: Every day | ORAL | 3 refills | Status: DC

## 2021-11-16 NOTE — Patient Instructions (Signed)
Continue your medication.  I have refilled the Imipramine.   Get your A1C drawn when you can.  Follow up in 6 months.

## 2021-11-20 NOTE — Assessment & Plan Note (Signed)
Well-controlled.  Continue Lipitor. 

## 2021-11-20 NOTE — Assessment & Plan Note (Signed)
Needs A1c to be drawn.

## 2021-11-20 NOTE — Assessment & Plan Note (Addendum)
Imipramine refilled. Maxalt as needed.

## 2021-11-20 NOTE — Assessment & Plan Note (Signed)
Stable. Continue Atenolol and Lisinopril.

## 2021-11-20 NOTE — Progress Notes (Signed)
Subjective:  Patient ID: Jeanette Pierce, female    DOB: May 02, 1965  Age: 56 y.o. MRN: [id number]  CC: Chief Complaint  Patient presents with   Diabetes    Checking sugars at home; 133 this morning.    Hypertension    Headache for past 3 days that will not go away. Whole back of head. Pt has lots of stress at this time.     HPI:  56 year old female with HTN,DM-2, HLD, Obesity, Migraine headache presents for follow up.  Patient is overdue for multiple preventative health care items but has no health insurance.  Overdue for A1C. Missed last appt with Endo. Diabetes is uncontrolled. However, she reports sugars are improving. Currently on Metformin 2000 mg total daily, Basaglar 22 units, Glipizide 5 mg BID. No hypoglycemia.  LDL has been at goal. Most recent LDL was 56. Tolerating Lipitor.  Has had headaches over the past 3 days. Has migraines. Is on Imipramine (started years ago by Neurology/headache clinic). No recent use of Maxalt.   BP stable on atenolol and lisinopril.   Patient Active Problem List   Diagnosis Date Noted   GERD (gastroesophageal reflux disease) 05/16/2021   Mixed hyperlipidemia 05/16/2021   Type 2 diabetes mellitus (HCC) 05/16/2021   Obesity 05/16/2021   Intertrigo 05/16/2021   Hypertension    Migraine headache 09/16/2012    Social Hx   Social History   Socioeconomic History   Marital status: Married    Spouse name: Not on file   Number of children: Not on file   Years of education: Not on file   Highest education level: Not on file  Occupational History   Not on file  Tobacco Use   Smoking status: Never   Smokeless tobacco: Never  Substance and Sexual Activity   Alcohol use: Not Currently   Drug use: Not Currently   Sexual activity: Not on file  Other Topics Concern   Not on file  Social History Narrative   Not on file   Social Determinants of Health   Financial Resource Strain: Not on file  Food Insecurity: Not on file   Transportation Needs: Not on file  Physical Activity: Not on file  Stress: Not on file  Social Connections: Not on file    Review of Systems Per HPI  Objective:  BP 138/86   Pulse 76   Temp 98.6 F (37 C)   Wt 221 lb 9.6 oz (100.5 kg)   SpO2 95%   BMI 40.53 kg/m      11/16/2021   10:21 AM 11/16/2021    9:47 AM 05/18/2021   10:01 AM  BP/Weight  Systolic BP 138 144 124  Diastolic BP 86 87 76  Wt. (Lbs)  221.6 209  BMI  40.53 kg/m2 38.23 kg/m2    Physical Exam Vitals and nursing note reviewed.  Constitutional:      General: She is not in acute distress.    Appearance: Normal appearance.  HENT:     Head: Normocephalic and atraumatic.  Eyes:     General:        Right eye: No discharge.        Left eye: No discharge.     Conjunctiva/sclera: Conjunctivae normal.  Cardiovascular:     Rate and Rhythm: Normal rate and regular rhythm.  Pulmonary:     Effort: Pulmonary effort is normal.     Breath sounds: Normal breath sounds. No wheezing, rhonchi or rales.  Neurological:  Mental Status: She is alert.  Psychiatric:        Mood and Affect: Mood normal.        Behavior: Behavior normal.    Lab Results  Component Value Date   WBC 8.5 10/24/2019   HGB 13.9 10/24/2019   HCT 43.0 10/24/2019   PLT 305 10/24/2019   GLUCOSE 132 (H) 05/18/2021   CHOL 141 05/18/2021   TRIG 175.0 (H) 05/18/2021   HDL 50.40 05/18/2021   LDLDIRECT 100.0 04/08/2019   LDLCALC 56 05/18/2021   ALT 70 (H) 10/24/2019   AST 44 (H) 10/24/2019   NA 140 05/18/2021   K 4.8 05/18/2021   CL 105 05/18/2021   CREATININE 0.65 05/18/2021   BUN 8 05/18/2021   CO2 30 05/18/2021   TSH 2.09 04/14/2020   HGBA1C 12.1 (A) 05/18/2021   MICROALBUR 19.6 (H) 05/18/2021     Assessment & Plan:   Problem List Items Addressed This Visit       Cardiovascular and Mediastinum   Hypertension (Chronic)    Stable. Continue Atenolol and Lisinopril.       Migraine headache    Imipramine refilled. Maxalt  as needed.       Relevant Medications   imipramine (TOFRANIL) 25 MG tablet     Endocrine   Type 2 diabetes mellitus (HCC) - Primary    Needs A1c to be drawn.       Relevant Orders   Hemoglobin A1c     Other   Mixed hyperlipidemia    Well controlled. Continue Lipitor.        Meds ordered this encounter  Medications   imipramine (TOFRANIL) 25 MG tablet    Sig: Take 4 tablets (100 mg total) by mouth at bedtime.    Dispense:  360 tablet    Refill:  3    Follow-up:  Return in about 6 months (around 05/17/2022).  [place]

## 2022-01-17 ENCOUNTER — Other Ambulatory Visit: Payer: Self-pay | Admitting: Family Medicine

## 2022-03-24 IMAGING — CT CT ANGIO CHEST
2 of 6 series · 18 of 46 positions shown · IV contrast (Omnipaque or Isovue)
Comparison: None.

CLINICAL DATA: Cough, COVID positive

EXAM:
CT ANGIOGRAPHY CHEST WITH CONTRAST
TECHNIQUE: Multidetector CT imaging of the chest was performed using the
standard protocol during bolus administration of intravenous
contrast. Multiplanar CT image reconstructions and MIPs were
obtained to evaluate the vascular anatomy.
CONTRAST:  100mL OMNIPAQUE IOHEXOL 350 MG/ML SOLN

[Series 5: pe axial thins · axial · 0.79mm/px · z∈[-516,-240]mm · 15 of 304 slices shown]
[im 14/304  lung]
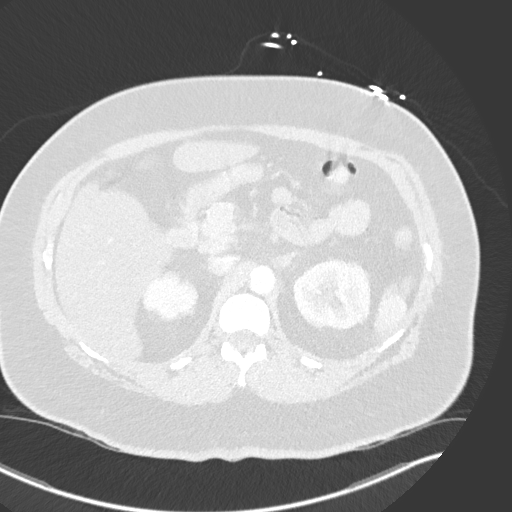
[im 40/304  soft-tissue]
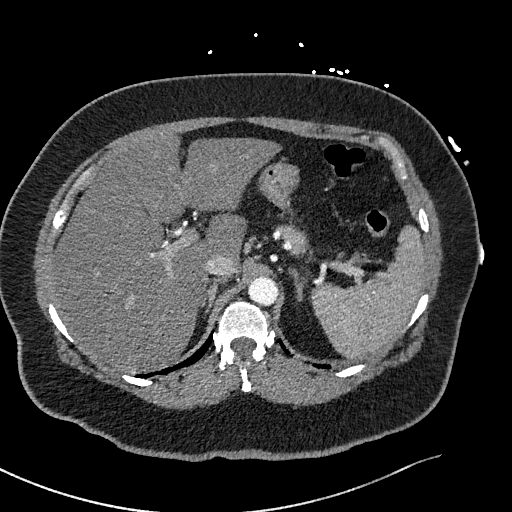
[im 53/304  lung]
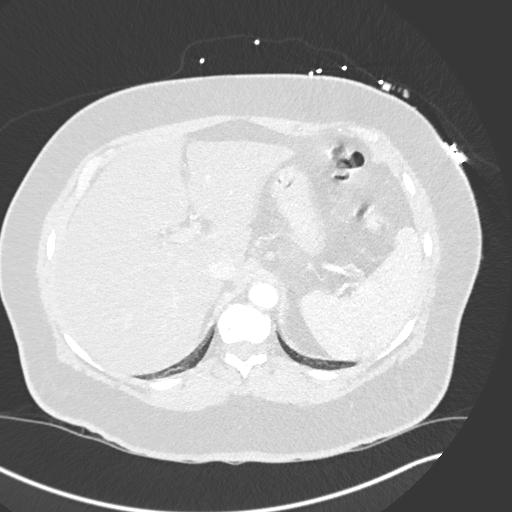
[im 80/304  soft-tissue]
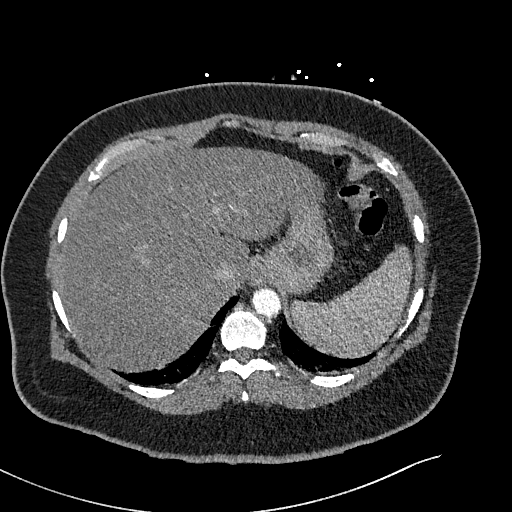
[im 93/304  lung]
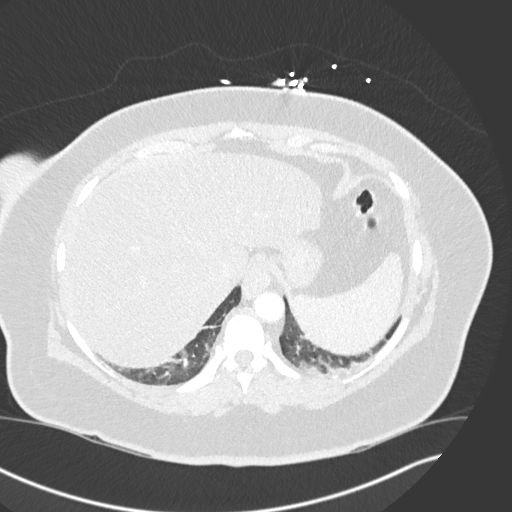
[im 119/304  soft-tissue]
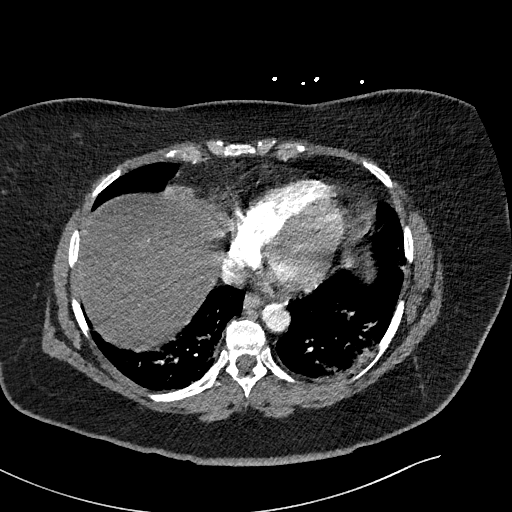
[im 132/304  lung]
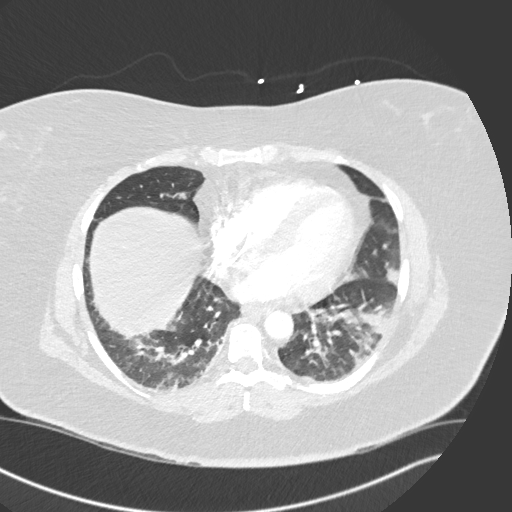
[im 159/304  soft-tissue]
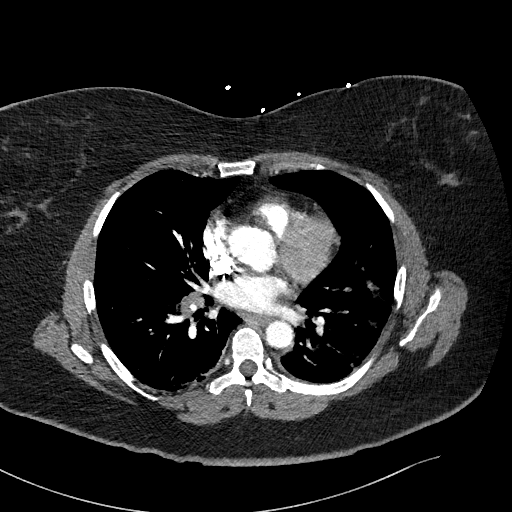
[im 172/304  lung]
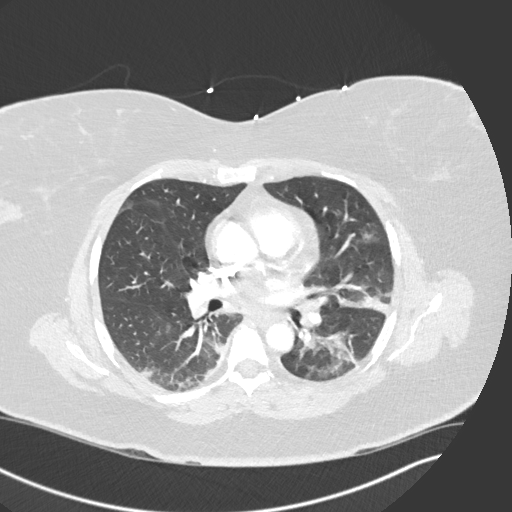
[im 185/304  soft-tissue]
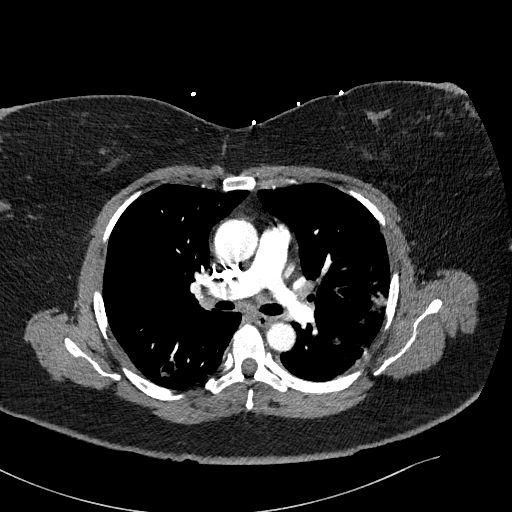
[im 211/304  lung]
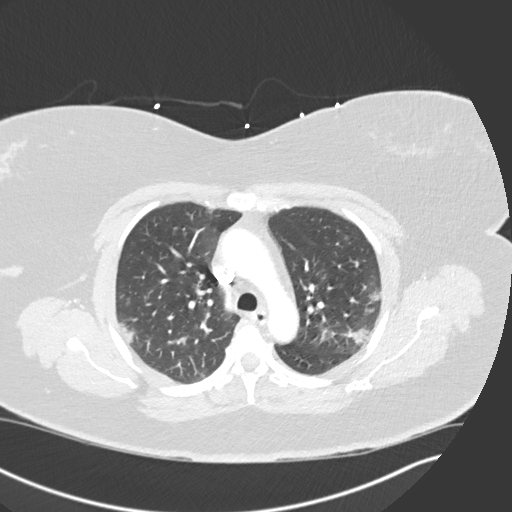
[im 224/304  soft-tissue]
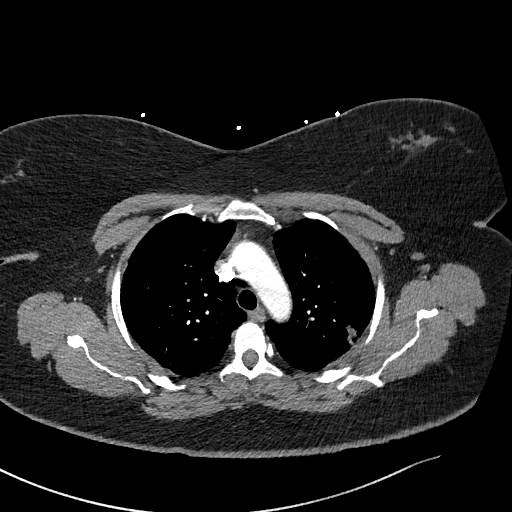
[im 251/304  lung]
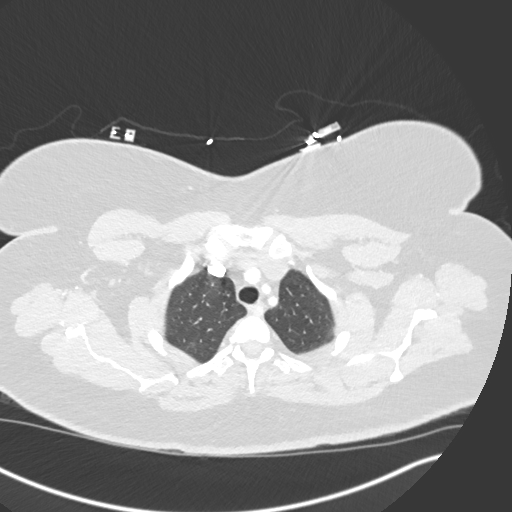
[im 264/304  soft-tissue]
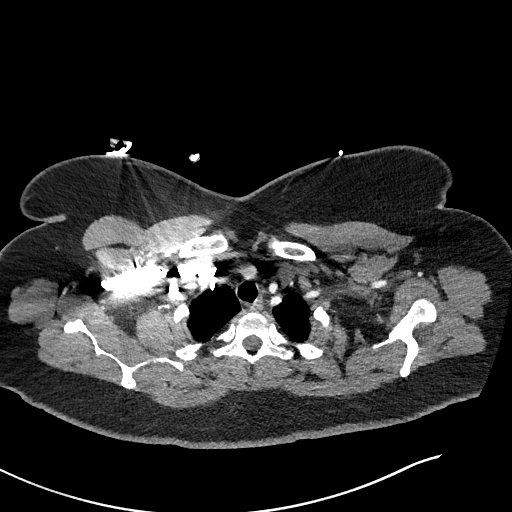
[im 290/304  lung]
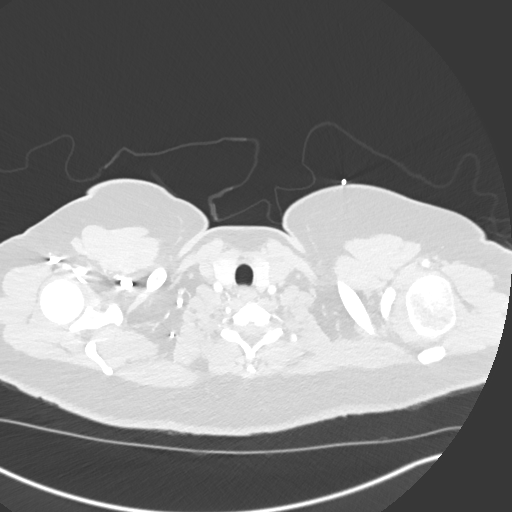

[Series 7: cor soft · coronal · 0.60mm/px · 3 of 167 slices shown]
[im 42/167  soft-tissue]
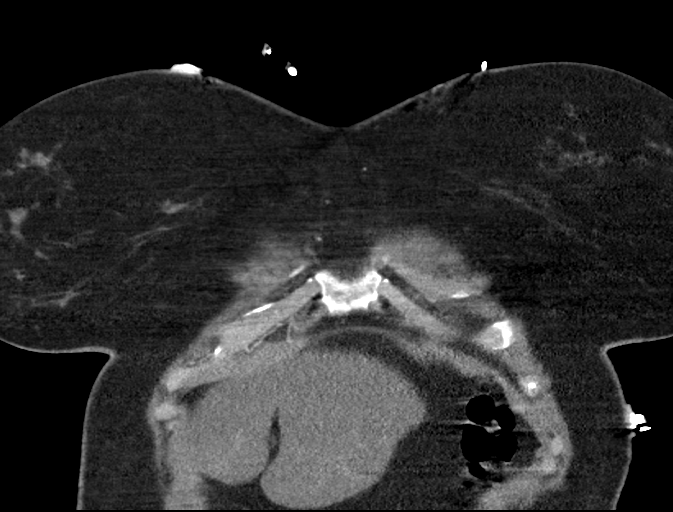
[im 84/167  soft-tissue]
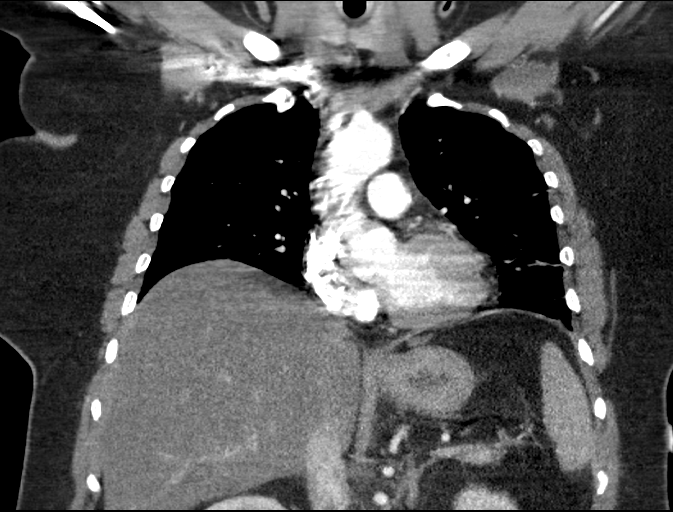
[im 125/167  soft-tissue]
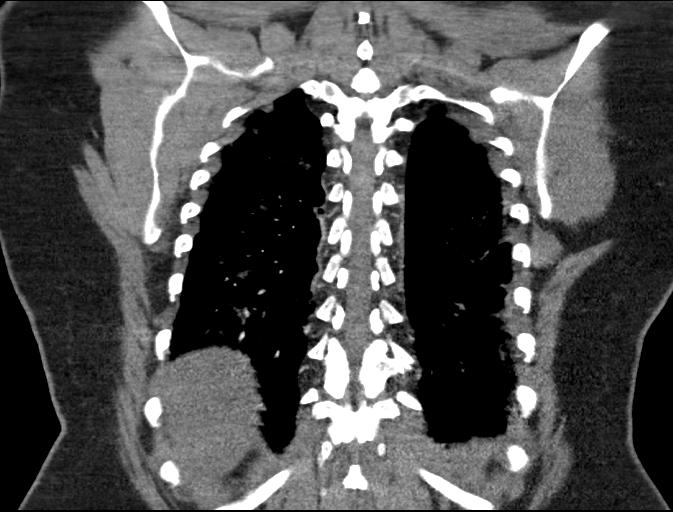

[18 of 46 positions shown; findings below may reference images not displayed]

FINDINGS: Cardiovascular: Satisfactory opacification of the pulmonary arteries
to the proximal segmental level. No evidence of pulmonary embolism.
Normal heart size. No pericardial effusion.

Mediastinum/Nodes: No mediastinal adenopathy. Mildly enlarged hilar
nodes are likely reactive. Visualized thyroid is unremarkable.
Esophagus is unremarkable.

Lungs/Pleura: No pleural effusion or pneumothorax. Patchy
consolidative and ground-glass opacities, greatest the lower lobes.

Upper Abdomen: Hepatic steatosis. Cholecystectomy. No acute
abnormality.

Musculoskeletal: No chest wall abnormality. No acute or significant
osseous findings.

Review of the MIP images confirms the above findings.
IMPRESSION: No evidence of acute pulmonary embolism.

Pulmonary opacities probably reflecting D2083-HW pneumonia.

## 2022-03-24 IMAGING — DX DG CHEST 1V PORT
1 series · 1 of 1 positions shown · non-contrast
Comparison: None.

CLINICAL DATA: 53-year-old female with cough.  Positive 70DGI-02.

EXAM:
PORTABLE CHEST 1 VIEW

[chest ap grid]
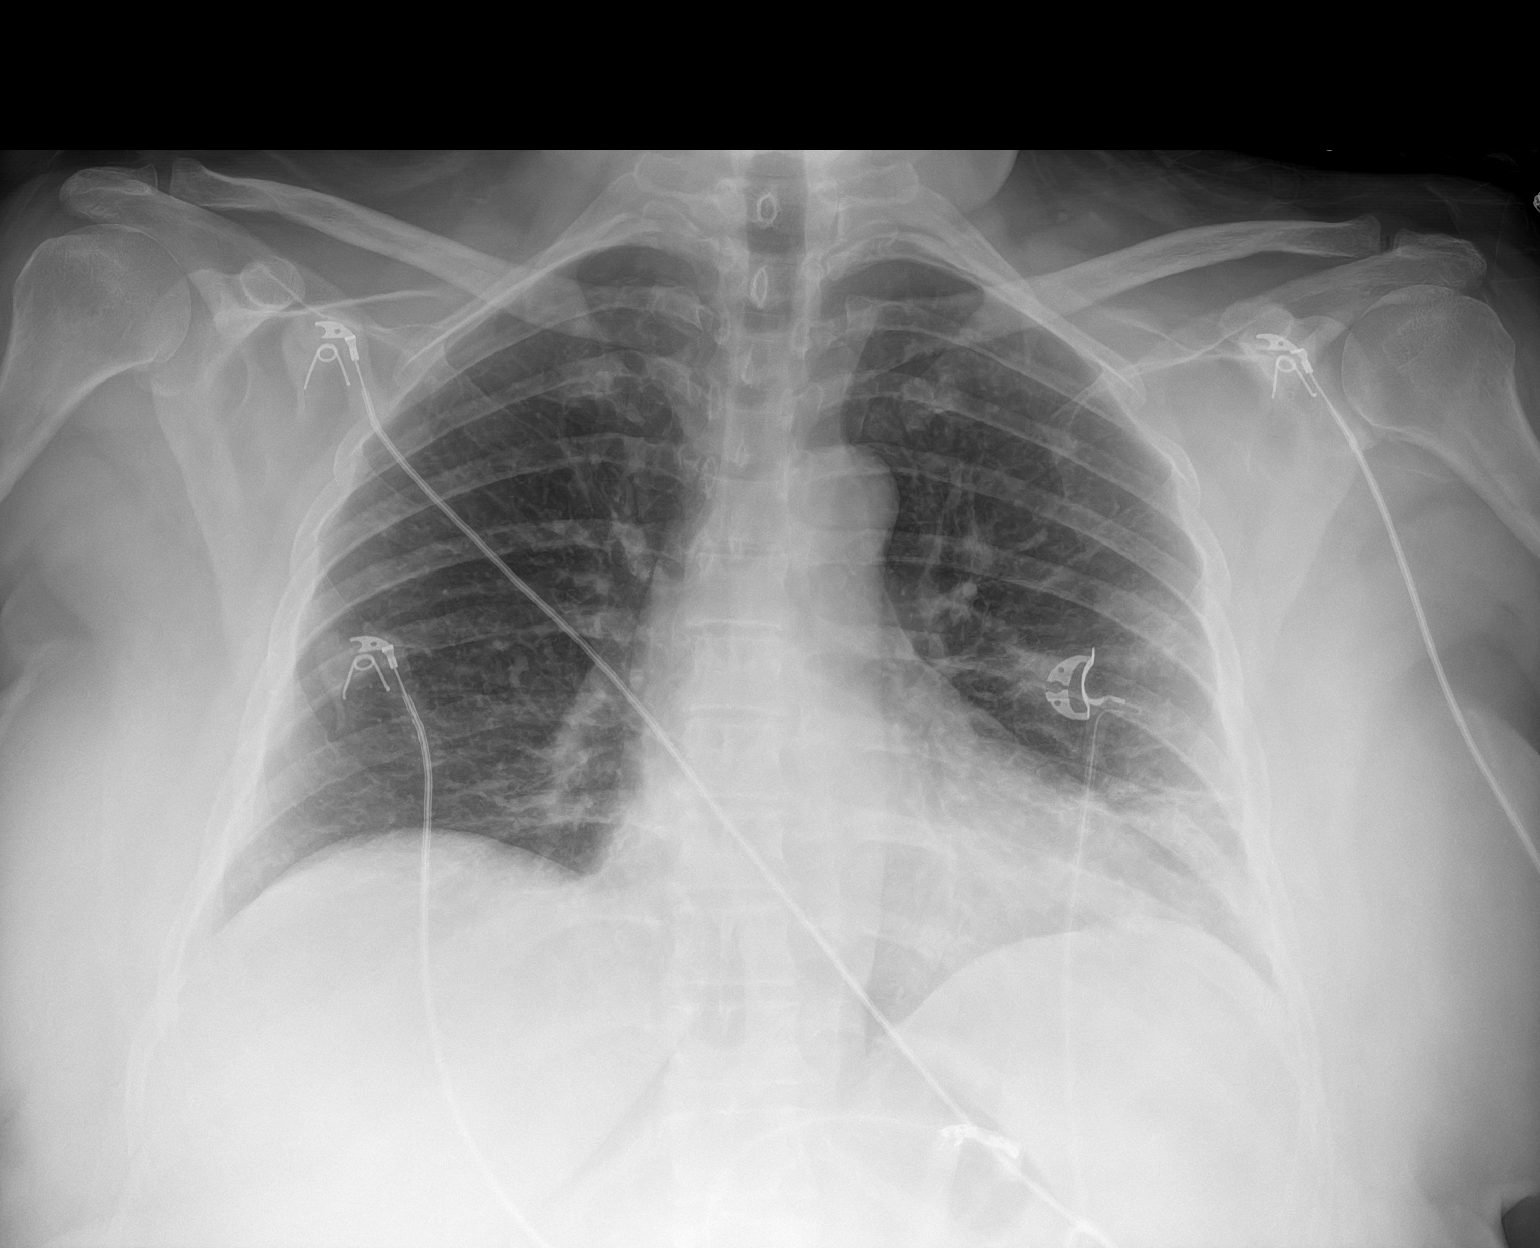

[1 of 1 positions shown; findings below may reference images not displayed]

FINDINGS: Left lung base streaky and hazy airspace density as well as faint
area of density in the left mid lung field consistent with
infiltrate, likely viral or atypical in etiology and in keeping with
70DGI-02. Clinical correlation and follow-up recommended. There is
no pleural effusion pneumothorax. The cardiac silhouette is within
limits. No acute osseous pathology.
IMPRESSION: Left lung base and left mid lung field infiltrates.

## 2022-03-25 ENCOUNTER — Telehealth: Payer: Self-pay | Admitting: Internal Medicine

## 2022-03-25 NOTE — Telephone Encounter (Signed)
Patient's spouse dropped of [employer] Patient Assistance. Placed in MD folder at front

## 2022-03-28 ENCOUNTER — Ambulatory Visit (INDEPENDENT_AMBULATORY_CARE_PROVIDER_SITE_OTHER): Payer: Self-pay | Admitting: Internal Medicine

## 2022-03-28 ENCOUNTER — Encounter: Payer: Self-pay | Admitting: Internal Medicine

## 2022-03-28 VITALS — BP 136/72 | HR 78 | Ht 62.0 in | Wt 231.0 lb

## 2022-03-28 DIAGNOSIS — E1142 Type 2 diabetes mellitus with diabetic polyneuropathy: Secondary | ICD-10-CM

## 2022-03-28 DIAGNOSIS — E785 Hyperlipidemia, unspecified: Secondary | ICD-10-CM

## 2022-03-28 DIAGNOSIS — Z794 Long term (current) use of insulin: Secondary | ICD-10-CM

## 2022-03-28 DIAGNOSIS — E1165 Type 2 diabetes mellitus with hyperglycemia: Secondary | ICD-10-CM

## 2022-03-28 LAB — POCT GLYCOSYLATED HEMOGLOBIN (HGB A1C): Hemoglobin A1C: 7.6 % — AB (ref 4.0–5.6)

## 2022-03-28 MED ORDER — INSULIN PEN NEEDLE 32G X 4 MM MISC: 1.0000 | Freq: Every day | 3 refills | Status: DC

## 2022-03-28 MED ORDER — GLIPIZIDE 5 MG PO TABS: 5.0000 mg | ORAL_TABLET | Freq: Two times a day (BID) | ORAL | 3 refills | Status: DC

## 2022-03-28 MED ORDER — BASAGLAR KWIKPEN 100 UNIT/ML ~~LOC~~ SOPN: 22.0000 [IU] | PEN_INJECTOR | Freq: Every day | SUBCUTANEOUS | 3 refills | Status: DC

## 2022-03-28 MED ORDER — ATORVASTATIN CALCIUM 10 MG PO TABS: 10.0000 mg | ORAL_TABLET | Freq: Every day | ORAL | 3 refills | Status: DC

## 2022-03-28 MED ORDER — METFORMIN HCL ER 500 MG PO TB24: ORAL_TABLET | ORAL | 3 refills | Status: DC

## 2022-03-28 NOTE — Progress Notes (Signed)
Name: Jeanette Pierce  Age/ Sex: 57 y.o., female   MRN/ DOB: [id number], 11-Mar-1965     PCP: Coral Spikes, DO   Reason for Endocrinology Evaluation: Type 2 Diabetes Mellitus  Initial Endocrine Consultative Visit: 10/28/2018    PATIENT IDENTIFIER: Jeanette Pierce is a 57 y.o. female with a past medical history of T2DM and Crohn's dz ( S/p iliocolectomy 1999)  The patient has followed with Endocrinology clinic since 10/28/2018 for consultative assistance with management of her diabetes.  DIABETIC HISTORY:  Jeanette Pierce was diagnosed with DM in 2019, pt had a prescription for metformin but she admits to not taking it, she was eventually started on insulin 09/2018 during hospitalization for DKA. Her hemoglobin A1c has ranged from 6.2% in 2019, peaking at > 15.5% in 2020.   On her initial presentation to our clinic she was on metformin, lantus and novolog with an A1c > 15.5%   We stopped her MDI regimen by 03/2019 with an A1c of 5.9%  Restarted insulin by 09/2019 after hospitalization for COVID  SUBJECTIVE:   During the last visit (05/18/2021): A1c 12.1 %    Today (03/28/2022): Jeanette Pierce is here for a  follow up on diabetes.  She checks her blood sugars 1 times daily  I had just completed patient assistance on Ozempic Snacks at night occasionally  Denies nausea except yesterday morning .  Has diarrhea with  crohn's  Has tingling of hands and feet   HOME ENDOCRINE REGIMEN:  Glipizide 5 mg BID Metformin 500 mg 2 tabs BID Basaglar 22  units daily  Atorvastatin 10 mg daily      METER DOWNLOAD SUMMARY: 1/2-03/28/2022 Average Number Tests/Day = 1 Overall Mean FS Glucose = 142 Standard Deviation = 30  BG Ranges: Low = 97 High = 255  Hypoglycemic Events/30 Days: BG < 50 = 0 Episodes of symptomatic severe hypoglycemia = 0   DIABETIC COMPLICATIONS: Microvascular complications:  Neuropathy  Denies: CKD, retinopathy Last eye exam: Completed 2021   Macrovascular complications:     Denies: CAD, PVD, CVA      HISTORY:  Past Medical History:  Past Medical History:  Diagnosis Date   Diabetes mellitus without complication ([place])    Hypertension    Migraine headache    Reflux    Past Surgical History:  Past Surgical History:  Procedure Laterality Date   CHOLECYSTECTOMY  before [place]   Social History:  reports that she has never smoked. She has never used smokeless tobacco. She reports that she does not currently use alcohol. She reports that she does not currently use drugs. Family History:  Family History  Problem Relation Age of Onset   Heart disease Mother    COPD Father    Asthma Father    Cancer Maternal Grandfather        colon and stomach     HOME MEDICATIONS: Allergies as of 03/28/2022       Reactions   Erythromycin Nausea And Vomiting   Nausea, vomiting        Medication List        Accurate as of March 28, 2022  9:47 AM. If you have any questions, ask your nurse or doctor.          atenolol 100 MG tablet Commonly known as: TENORMIN Take 1 tablet (100 mg total) by mouth daily.   atorvastatin 10 MG tablet Commonly known as: LIPITOR  Take 1 tablet (10 mg total) by mouth daily.   BASAGLAR KWIKPEN [place] Inject 22 mLs into the skin.   Clotrimazole 1 % Oint Apply 1 application. topically in the morning and at bedtime.   glipiZIDE 5 MG tablet Commonly known as: GLUCOTROL Take 1 tablet (5 mg total) by mouth 2 (two) times daily before a meal.   ibuprofen 800 MG tablet Commonly known as: ADVIL TAKE 1 TABLET BY MOUTH EVERY 8 HOURS AS NEEDED   imipramine 25 MG tablet Commonly known as: TOFRANIL Take 4 tablets (100 mg total) by mouth at bedtime.   lisinopril 20 MG tablet Commonly known as: ZESTRIL Take 1 tablet (20 mg total) by mouth daily.   metFORMIN 500 MG 24 hr tablet Commonly known as: GLUCOPHAGE-XR TAKE 2 TABLETS BY MOUTH TWICE DAILY WITH A MEAL   rizatriptan 10 MG  tablet Commonly known as: MAXALT Take 1 tablet (10 mg total) by mouth daily as needed (for migraine pain).         OBJECTIVE:   Vital Signs: BP 136/72 (BP Location: Left Arm, Patient Position: Sitting, Cuff Size: Large)   Pulse 78   Ht 5\' 2"  (1.575 m)   Wt 231 lb (104.8 kg)   SpO2 96%   BMI 42.25 kg/m   Wt Readings from Last 3 Encounters:  03/28/22 231 lb (104.8 kg)  11/16/21 221 lb 9.6 oz (100.5 kg)  05/18/21 209 lb (94.8 kg)     Exam: General: Pt appears well and is in NAD  Lungs: Clear with good BS bilat with no rales, rhonchi, or wheezes  Heart: RRR   Abdomen: Normoactive bowel sounds, soft, nontender, without masses or organomegaly palpable  Extremities: No pretibial edema.   Neuro: MS is good with appropriate affect, pt is alert and Ox3    DM foot exam: 03/28/2022  The skin of the feet is intact without sores or ulcerations. The pedal pulses are 2+ on right and 2+ on left. The sensation is intact to a screening 5.07, 10 gram monofilament bilaterally    DATA REVIEWED:  Lab Results  Component Value Date   HGBA1C 7.6 (A) 03/28/2022   HGBA1C 12.1 (A) 05/18/2021   HGBA1C 7.1 (A) 11/15/2020    Latest Reference Range & Units 05/18/21 71:69  BASIC METABOLIC PANEL  Rpt !  Sodium 135 - 145 mEq/L 140  Potassium 3.5 - 5.1 mEq/L 4.8  Chloride 96 - 112 mEq/L 105  CO2 19 - 32 mEq/L 30  Glucose 70 - 99 mg/dL 132 (H)  BUN 6 - 23 mg/dL 8  Creatinine 0.40 - 1.20 mg/dL 0.65  Calcium 8.4 - 10.5 mg/dL 9.2  GFR >60.00 mL/min 100.62  Total CHOL/HDL Ratio  3  Cholesterol 0 - 200 mg/dL 141  HDL Cholesterol >39.00 mg/dL 50.40  LDL (calc) 0 - 99 mg/dL 56  MICROALB/CREAT RATIO 0.0 - 30.0 mg/g 10.5  NonHDL  90.72  Triglycerides 0.0 - 149.0 mg/dL 175.0 (H)  VLDL 0.0 - 40.0 mg/dL 35.0    Latest Reference Range & Units 05/18/21 10:38  Creatinine,U mg/dL 187.4  Microalb, Ur 0.0 - 1.9 mg/dL 19.6 (H)  MICROALB/CREAT RATIO 0.0 - 30.0 mg/g 10.5     ASSESSMENT / PLAN /  RECOMMENDATIONS:   1) Type 2 Diabetes Mellitus, Sub-Optimally  controlled, With neuropathic  complications - Most recent A1c of 7.6  %. Goal A1c < 7.0 %.    -Her A1c has improved from 12.1% to 7.6 %  -She is interested in Ozempic,I have  signed patient assistance papers last week, cautioned against GI side effects especially with hx of Crohn's disease   - She was given instruction to reduce Glipizide to once daily while on Ozempic and to slowly titrate Ozempic to 0.5 mg weekly   MEDICATIONS:  -Continue Metformin 500 mg 2 tabs daily  -Continue Basaglar 22 units daily  -Continue Glipizide 5 mg, 1 tablet twice daily   If she receives Ozempic: Decrease glipizide to once daily but continue metformin and Basaglar. Start Ozempic 0.25 mg weekly for 6 weeks then increase to 0.5 mg weekly   EDUCATION / INSTRUCTIONS: BG monitoring instructions: Patient is instructed to check her blood sugars 1 times a day , fasting  Call [facility] clinic if: BG persistently < 70  I reviewed the Rule of 15 for the treatment of hypoglycemia in detail with the patient. Literature supplied.   2.Mixed Dyslipidemia    - She was started on Atorvastatin 03/2019 due to elevated Triglycerides 220 mg/dL  -Lipid panel showed improvement     Medication Continue Atorvastatin 10 mg daily     F/U in 6 months    Signed electronically by: Mack Guise, MD  [facility] Endocrinology  [facility] [address] [place], [place] [address] Phone: [phone] FAX: [phone]   CC: Alyssha, Housh, DO [place] [address] Phone: [phone]  Fax: [phone]  Return to Endocrinology clinic as below: Future Appointments  Date Time Provider [place]  03/28/2022  9:50 AM Taleen Prosser, Melanie Crazier, MD [facility] None  05/17/2022  9:40 AM Coral Spikes, DO [facility] [place]

## 2022-03-28 NOTE — Patient Instructions (Addendum)
-  Continue Metformin 500 mg, 2 tablets Twice daily  - Continue Basaglar  22 units daily  - Continue  Glipizide 5 mg , 1 tablet before first meal of the day and 1 tablet before last meal of the day    IF YOU START OZEMPIC : Decrease Glipizide to 1 tablet before Breakfast only ( none with supper) Start Ozempic 0.25 mg once weekly for 6 weeks, then increase to 0.5 mg once weekly  Continue Metformin and Basaglar as above     HOW TO TREAT LOW BLOOD SUGARS (Blood sugar LESS THAN 70 MG/DL) Please follow the RULE OF 15 for the treatment of hypoglycemia treatment (when your (blood sugars are less than 70 mg/dL)   STEP 1: Take 15 grams of carbohydrates when your blood sugar is low, which includes:  3-4 GLUCOSE TABS  OR 3-4 OZ OF JUICE OR REGULAR SODA OR ONE TUBE OF GLUCOSE GEL    STEP 2: RECHECK blood sugar in 15 MINUTES STEP 3: If your blood sugar is still low at the 15 minute recheck --> then, go back to STEP 1 and treat AGAIN with another 15 grams of carbohydrates.

## 2022-04-14 ENCOUNTER — Other Ambulatory Visit: Payer: Self-pay | Admitting: Internal Medicine

## 2022-04-22 ENCOUNTER — Telehealth: Payer: Self-pay | Admitting: Internal Medicine

## 2022-04-22 NOTE — Telephone Encounter (Signed)
Spouse, alexander Cackowski, picked up pt medication today 04/22/22 in office.

## 2022-04-30 ENCOUNTER — Other Ambulatory Visit: Payer: Self-pay | Admitting: Family Medicine

## 2022-04-30 DIAGNOSIS — Z1231 Encounter for screening mammogram for malignant neoplasm of breast: Secondary | ICD-10-CM

## 2022-05-16 ENCOUNTER — Other Ambulatory Visit (HOSPITAL_COMMUNITY): Payer: Self-pay | Admitting: Family Medicine

## 2022-05-16 DIAGNOSIS — Z1231 Encounter for screening mammogram for malignant neoplasm of breast: Secondary | ICD-10-CM

## 2022-05-17 ENCOUNTER — Encounter: Payer: Self-pay | Admitting: Family Medicine

## 2022-05-17 ENCOUNTER — Ambulatory Visit (INDEPENDENT_AMBULATORY_CARE_PROVIDER_SITE_OTHER): Payer: Self-pay | Admitting: Family Medicine

## 2022-05-17 VITALS — BP 150/104 | HR 77 | Temp 97.4°F | Ht 62.0 in | Wt 227.0 lb

## 2022-05-17 DIAGNOSIS — Z794 Long term (current) use of insulin: Secondary | ICD-10-CM

## 2022-05-17 DIAGNOSIS — I10 Essential (primary) hypertension: Secondary | ICD-10-CM

## 2022-05-17 DIAGNOSIS — E119 Type 2 diabetes mellitus without complications: Secondary | ICD-10-CM

## 2022-05-17 DIAGNOSIS — E782 Mixed hyperlipidemia: Secondary | ICD-10-CM

## 2022-05-17 MED ORDER — LISINOPRIL 30 MG PO TABS: 30.0000 mg | ORAL_TABLET | Freq: Every day | ORAL | 3 refills | Status: DC

## 2022-05-17 NOTE — Patient Instructions (Signed)
Labs in ~ 2 weeks.  Follow up in 3-6 months. Check BP @ home.  Take care  Dr. Lacinda Axon

## 2022-05-19 NOTE — Progress Notes (Signed)
Subjective:  Patient ID: Jeanette Pierce, female    DOB: Mar 31, 1965  Age: 57 y.o. MRN: [id number]  CC: Chief Complaint  Patient presents with   Hypertension    HPI:  57 year old female with HTN, Migraine, GERD, DM-2, Obesity, and Hyperlipidemia presents for follow up.  Patient's BP is elevated. She is compliant with Atenolol and Lisinopril.  Control of diabetes has improved dramatically. She is following with Endo. She is on Basaglar, Glipizide and Ozempic.  HLD has been stable. Needs lipid panel. Compliant with Lipitor.  Patient Active Problem List   Diagnosis Date Noted   GERD (gastroesophageal reflux disease) 05/16/2021   Mixed hyperlipidemia 05/16/2021   Type 2 diabetes mellitus ([place]) 05/16/2021   Obesity 05/16/2021   Intertrigo 05/16/2021   Hypertension    Migraine headache 09/16/2012    Social Hx   Social History   Socioeconomic History   Marital status: Married    Spouse name: Not on file   Number of children: Not on file   Years of education: Not on file   Highest education level: 12th grade  Occupational History   Not on file  Tobacco Use   Smoking status: Never   Smokeless tobacco: Never  Substance and Sexual Activity   Alcohol use: Not Currently   Drug use: Not Currently   Sexual activity: Not on file  Other Topics Concern   Not on file  Social History Narrative   Not on file   Social Determinants of Health   Financial Resource Strain: Medium Risk (05/16/2022)   Overall Financial Resource Strain (CARDIA)    Difficulty of Paying Living Expenses: Somewhat hard  Food Insecurity: No Food Insecurity (05/16/2022)   Hunger Vital Sign    Worried About Running Out of Food in the Last Year: Never true    Ran Out of Food in the Last Year: Never true  Transportation Needs: No Transportation Needs (05/16/2022)   PRAPARE - Hydrologist (Medical): No    Lack of Transportation (Non-Medical): No  Physical Activity: Insufficiently  Active (05/16/2022)   Exercise Vital Sign    Days of Exercise per Week: 2 days    Minutes of Exercise per Session: 30 min  Stress: Stress Concern Present (05/16/2022)   [place]    Feeling of Stress : To some extent  Social Connections: Moderately Isolated (05/16/2022)   Social Connection and Isolation Panel [NHANES]    Frequency of Communication with Friends and Family: More than three times a week    Frequency of Social Gatherings with Friends and Family: Once a week    Attends Religious Services: Never    Marine scientist or Organizations: No    Attends Music therapist: Not on file    Marital Status: Married    Review of Systems  Constitutional: Negative.   Respiratory: Negative.    Cardiovascular: Negative.    Per HPI  Objective:  BP (!) 150/104   Pulse 77   Temp (!) 97.4 F (36.3 C)   Ht 5\' 2"  (1.575 m)   Wt 227 lb (103 kg)   SpO2 96%   BMI 41.52 kg/m      05/17/2022   10:12 AM 05/17/2022    9:34 AM 03/28/2022    9:35 AM  BP/Weight  Systolic BP [id number]  Diastolic BP 123456 94 72  Wt. (Lbs)  227 231  BMI  41.52 kg/m2  42.25 kg/m2    Physical Exam Vitals and nursing note reviewed.  Constitutional:      General: She is not in acute distress.    Appearance: Normal appearance.  HENT:     Head: Normocephalic and atraumatic.  Eyes:     General:        Right eye: No discharge.        Left eye: No discharge.     Conjunctiva/sclera: Conjunctivae normal.  Cardiovascular:     Rate and Rhythm: Normal rate and regular rhythm.  Pulmonary:     Effort: Pulmonary effort is normal.     Breath sounds: Normal breath sounds. No wheezing, rhonchi or rales.  Neurological:     Mental Status: She is alert.  Psychiatric:        Mood and Affect: Mood normal.        Behavior: Behavior normal.     Lab Results  Component Value Date   WBC 8.5 10/24/2019   HGB 13.9 10/24/2019   HCT 43.0  10/24/2019   PLT 305 10/24/2019   GLUCOSE 132 (H) 05/18/2021   CHOL 141 05/18/2021   TRIG 175.0 (H) 05/18/2021   HDL 50.40 05/18/2021   LDLDIRECT 100.0 04/08/2019   LDLCALC 56 05/18/2021   ALT 70 (H) 10/24/2019   AST 44 (H) 10/24/2019   NA 140 05/18/2021   K 4.8 05/18/2021   CL 105 05/18/2021   CREATININE 0.65 05/18/2021   BUN 8 05/18/2021   CO2 30 05/18/2021   TSH 2.09 04/14/2020   HGBA1C 7.6 (A) 03/28/2022   MICROALBUR 19.6 (H) 05/18/2021     Assessment & Plan:   Problem List Items Addressed This Visit       Cardiovascular and Mediastinum   Hypertension - Primary (Chronic)    BP uncontrolled. Increasing lisinopril. Labs in 2 weeks.      Relevant Medications   lisinopril (ZESTRIL) 30 MG tablet   Other Relevant Orders   Basic metabolic panel     Endocrine   Type 2 diabetes mellitus ([place])    Improving. Continue current medications. Close follow up with Endo.      Relevant Medications   Semaglutide,0.25 or 0.5MG /DOS, (OZEMPIC, 0.25 OR 0.5 MG/DOSE,) 2 MG/3ML SOPN   lisinopril (ZESTRIL) 30 MG tablet     Other   Mixed hyperlipidemia    Well controlled. Lipid panel ordered. Continue Lipitor.      Relevant Medications   lisinopril (ZESTRIL) 30 MG tablet   Other Relevant Orders   Lipid panel    Meds ordered this encounter  Medications   lisinopril (ZESTRIL) 30 MG tablet    Sig: Take 1 tablet (30 mg total) by mouth daily.    Dispense:  90 tablet    Refill:  3    Follow-up:  3-6 months.  [place]

## 2022-05-19 NOTE — Assessment & Plan Note (Signed)
Improving. Continue current medications. Close follow up with Endo.

## 2022-05-19 NOTE — Assessment & Plan Note (Signed)
BP uncontrolled. Increasing lisinopril. Labs in 2 weeks.

## 2022-05-19 NOTE — Assessment & Plan Note (Signed)
Well controlled. Lipid panel ordered. Continue Lipitor.

## 2022-05-22 ENCOUNTER — Other Ambulatory Visit: Payer: Self-pay | Admitting: Family Medicine

## 2022-05-22 ENCOUNTER — Telehealth: Payer: Self-pay

## 2022-05-22 NOTE — Telephone Encounter (Signed)
Coral Spikes, DO     She has been taking this medication. I just increased the dose. Is there something I am missing here?

## 2022-05-22 NOTE — Telephone Encounter (Signed)
Pt can not take the lisinopril (ZESTRIL) 30 MG tablet this makes her sick is there any thing else she can be put on. She has nausea with this med.   [place] [phone]

## 2022-05-23 ENCOUNTER — Ambulatory Visit (HOSPITAL_COMMUNITY)
Admission: RE | Admit: 2022-05-23 | Discharge: 2022-05-23 | Disposition: A | Discharge status: Home / Self Care | Payer: Self-pay | Source: Ambulatory Visit | Attending: Family Medicine | Admitting: Family Medicine

## 2022-05-23 ENCOUNTER — Other Ambulatory Visit: Payer: Self-pay | Admitting: Family Medicine

## 2022-05-23 DIAGNOSIS — Z1231 Encounter for screening mammogram for malignant neoplasm of breast: Secondary | ICD-10-CM | POA: Insufficient documentation

## 2022-05-23 MED ORDER — AMLODIPINE BESYLATE 5 MG PO TABS: 5.0000 mg | ORAL_TABLET | Freq: Every day | ORAL | 3 refills | Status: DC

## 2022-05-23 NOTE — Telephone Encounter (Signed)
Thersa Salt G, DO   Return back to 20 mg dose and monitor. If she needs this sent in, please do so.

## 2022-05-23 NOTE — Telephone Encounter (Signed)
Patient states she had stopped the Lisinopril and was taking nothing because it made her feel bad - she tried to restart at her old dose of 20mg  but can not take it- she states she never made it to 30mg 

## 2022-05-28 NOTE — Telephone Encounter (Signed)
Patient is aware and picked up amlodipine at the pharmacy.

## 2022-06-29 ENCOUNTER — Other Ambulatory Visit: Payer: Self-pay | Admitting: Family Medicine

## 2022-07-05 ENCOUNTER — Encounter: Payer: Self-pay | Admitting: Internal Medicine

## 2022-08-16 ENCOUNTER — Ambulatory Visit (INDEPENDENT_AMBULATORY_CARE_PROVIDER_SITE_OTHER): Payer: Self-pay | Admitting: Family Medicine

## 2022-08-16 ENCOUNTER — Encounter: Payer: Self-pay | Admitting: Family Medicine

## 2022-08-16 VITALS — BP 142/86 | HR 80 | Ht 62.0 in | Wt 219.4 lb

## 2022-08-16 DIAGNOSIS — L729 Follicular cyst of the skin and subcutaneous tissue, unspecified: Secondary | ICD-10-CM

## 2022-08-16 DIAGNOSIS — E782 Mixed hyperlipidemia: Secondary | ICD-10-CM

## 2022-08-16 DIAGNOSIS — I10 Essential (primary) hypertension: Secondary | ICD-10-CM

## 2022-08-16 DIAGNOSIS — Z794 Long term (current) use of insulin: Secondary | ICD-10-CM

## 2022-08-16 DIAGNOSIS — E119 Type 2 diabetes mellitus without complications: Secondary | ICD-10-CM

## 2022-08-16 NOTE — Patient Instructions (Signed)
Continue your medications.  Follow up in 3 months.  Check BP regularly at home.  Take care  Dr. Adriana Simas

## 2022-08-18 DIAGNOSIS — L729 Follicular cyst of the skin and subcutaneous tissue, unspecified: Secondary | ICD-10-CM | POA: Insufficient documentation

## 2022-08-18 NOTE — Assessment & Plan Note (Signed)
BP mildly elevated today.  Advised to monitor BP at home.  Continue current medications.

## 2022-08-18 NOTE — Assessment & Plan Note (Signed)
LDL has been at goal.  Needs labs.  This was deferred due to lack of insurance.  Continue Lipitor.

## 2022-08-18 NOTE — Assessment & Plan Note (Signed)
Needs A1c.  Waiting for insurance to kick in.  Continue current medications.  She has follow-up with endocrinology.  Recommend increase in semaglutide.

## 2022-08-18 NOTE — Assessment & Plan Note (Signed)
Cyst contents were removed with gentle pressure today.

## 2022-08-18 NOTE — Progress Notes (Signed)
Subjective:  Patient ID: Jeanette Pierce, female    DOB: 1965-08-29  Age: 57 y.o. MRN: [id number]  CC: Chief Complaint  Patient presents with   Hypertension   Follow-up    HPI:  57 year old female with the below mentioned medical problems presents for follow-up.  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  Right now she is still self-pay.  BP mildly elevated here.  She is compliant with amlodipine, atenolol, and lisinopril.  Patient is due for A1c.  However she wants to wait due to lack of insurance.  Last A1c was 7.6.  Follows with Endo.  She is on metformin, semaglutide, and insulin.  He is also on glipizide.  Would benefit from increase in semaglutide to promote weight loss.    Patient got her mammogram done.  She is waiting on insurance before proceeding with other preventative healthcare items such as colonoscopy.  Patient reports that she has a "knot" on her right shoulder.  She states that she has had this for quite some  No chest pain or shortness of breath.  No other complaints or concerns at this time.   Patient Active Problem List   Diagnosis Date Noted   Benign skin cyst 08/18/2022   GERD (gastroesophageal reflux disease) 05/16/2021   Mixed hyperlipidemia 05/16/2021   Type 2 diabetes mellitus (HCC) 05/16/2021   Obesity 05/16/2021   Intertrigo 05/16/2021   Hypertension    Migraine headache 09/16/2012    Social Hx   Social History   Socioeconomic History   Marital status: Married    Spouse name: Not on file   Number of children: Not on file   Years of education: Not on file   Highest education level: 12th grade  Occupational History   Not on file  Tobacco Use   Smoking status: Never   Smokeless tobacco: Never  Substance and Sexual Activity   Alcohol use: Not Currently   Drug use: Not Currently   Sexual activity: Not on file  Other Topics Concern   Not on file  Social History Narrative   Not on file   Social Determinants of Health    Financial Resource Strain: Medium Risk (05/16/2022)   Overall Financial Resource Strain (CARDIA)    Difficulty of Paying Living Expenses: Somewhat hard  Food Insecurity: No Food Insecurity (05/16/2022)   Hunger Vital Sign    Worried About Running Out of Food in the Last Year: Never true    Ran Out of Food in the Last Year: Never true  Transportation Needs: No Transportation Needs (05/16/2022)   PRAPARE - Administrator, Civil Service (Medical): No    Lack of Transportation (Non-Medical): No  Physical Activity: Insufficiently Active (05/16/2022)   Exercise Vital Sign    Days of Exercise per Week: 2 days    Minutes of Exercise per Session: 30 min  Stress: Stress Concern Present (05/16/2022)   [employer] of Occupational Health - Occupational Stress Questionnaire    Feeling of Stress : To some extent  Social Connections: Moderately Isolated (05/16/2022)   Social Connection and Isolation Panel [NHANES]    Frequency of Communication with Friends and Family: More than three times a week    Frequency of Social Gatherings with Friends and Family: Once a week    Attends Religious Services: Never    Database administrator or Organizations: No    Attends Engineer, structural: Not on file    Marital Status: Married    Review  of Systems Per HPI  Objective:  BP (!) 142/86   Pulse 80   Ht 5\' 2"  (1.575 m)   Wt 219 lb 6.4 oz (99.5 kg)   SpO2 97%   BMI 40.13 kg/m      08/16/2022    9:23 AM 05/17/2022   10:12 AM 05/17/2022    9:34 AM  BP/Weight  Systolic BP 142 150 151  Diastolic BP 86 104 94  Wt. (Lbs) 219.4  227  BMI 40.13 kg/m2  41.52 kg/m2    Physical Exam Vitals and nursing note reviewed.  Constitutional:      General: She is not in acute distress.    Appearance: Normal appearance.  HENT:     Head: Normocephalic and atraumatic.  Eyes:     General:        Right eye: No discharge.        Left eye: No discharge.     Conjunctiva/sclera: Conjunctivae  normal.  Cardiovascular:     Rate and Rhythm: Normal rate and regular rhythm.  Pulmonary:     Effort: Pulmonary effort is normal.     Breath sounds: Normal breath sounds. No wheezing, rhonchi or rales.  Skin:    Comments: Anterior right shoulder with small cyst.  Gentle pressure was applied and cyst contents were exuded from a small opening.  Neurological:     Mental Status: She is alert.  Psychiatric:        Mood and Affect: Mood normal.        Behavior: Behavior normal.     Lab Results  Component Value Date   WBC 8.5 10/24/2019   HGB 13.9 10/24/2019   HCT 43.0 10/24/2019   PLT 305 10/24/2019   GLUCOSE 132 (H) 05/18/2021   CHOL 141 05/18/2021   TRIG 175.0 (H) 05/18/2021   HDL 50.40 05/18/2021   LDLDIRECT 100.0 04/08/2019   LDLCALC 56 05/18/2021   ALT 70 (H) 10/24/2019   AST 44 (H) 10/24/2019   NA 140 05/18/2021   K 4.8 05/18/2021   CL 105 05/18/2021   CREATININE 0.65 05/18/2021   BUN 8 05/18/2021   CO2 30 05/18/2021   TSH 2.09 04/14/2020   HGBA1C 7.6 (A) 03/28/2022   MICROALBUR 19.6 (H) 05/18/2021     Assessment & Plan:   Problem List Items Addressed This Visit       Cardiovascular and Mediastinum   Hypertension - Primary (Chronic)    BP mildly elevated today.  Advised to monitor BP at home.  Continue current medications.        Endocrine   Type 2 diabetes mellitus (HCC)    Needs A1c.  Waiting for insurance to kick in.  Continue current medications.  She has follow-up with endocrinology.  Recommend increase in semaglutide.        Musculoskeletal and Integument   Benign skin cyst    Cyst contents were removed with gentle pressure today.        Other   Mixed hyperlipidemia    LDL has been at goal.  Needs labs.  This was deferred due to lack of insurance.  Continue Lipitor.       Follow-up: 3 months  Anya Murphey Adriana Simas DO [facility]

## 2022-09-11 LAB — HM DIABETES EYE EXAM

## 2022-10-02 ENCOUNTER — Encounter: Payer: Self-pay | Admitting: Internal Medicine

## 2022-10-02 ENCOUNTER — Ambulatory Visit: Payer: BC Managed Care – PPO | Admitting: Internal Medicine

## 2022-10-02 ENCOUNTER — Telehealth: Payer: Self-pay

## 2022-10-02 VITALS — BP 128/82 | HR 60 | Ht 62.0 in | Wt 219.0 lb

## 2022-10-02 DIAGNOSIS — Z794 Long term (current) use of insulin: Secondary | ICD-10-CM | POA: Diagnosis not present

## 2022-10-02 DIAGNOSIS — E785 Hyperlipidemia, unspecified: Secondary | ICD-10-CM

## 2022-10-02 DIAGNOSIS — E1142 Type 2 diabetes mellitus with diabetic polyneuropathy: Secondary | ICD-10-CM | POA: Diagnosis not present

## 2022-10-02 LAB — COMPREHENSIVE METABOLIC PANEL
ALT: 50 U/L — ABNORMAL HIGH (ref 0–35)
AST: 29 U/L (ref 0–37)
Albumin: 4.7 g/dL (ref 3.5–5.2)
Alkaline Phosphatase: 65 U/L (ref 39–117)
BUN: 13 mg/dL (ref 6–23)
CO2: 26 mEq/L (ref 19–32)
Calcium: 10.2 mg/dL (ref 8.4–10.5)
Chloride: 104 mEq/L (ref 96–112)
Creatinine, Ser: 0.86 mg/dL (ref 0.40–1.20)
GFR: 76.47 mL/min (ref >60.00)
Glucose, Bld: 71 mg/dL (ref 70–99)
Potassium: 4.3 mEq/L (ref 3.5–5.1)
Sodium: 140 mEq/L (ref 135–145)
Total Bilirubin: 0.6 mg/dL (ref 0.2–1.2)
Total Protein: 7.7 g/dL (ref 6.0–8.3)

## 2022-10-02 LAB — LIPID PANEL
Cholesterol: 122 mg/dL (ref 0–200)
HDL: 48.3 mg/dL (ref >39.00)
LDL Cholesterol: 38 mg/dL (ref 0–99)
NonHDL: 73.7
Total CHOL/HDL Ratio: 3
Triglycerides: 178 mg/dL — ABNORMAL HIGH (ref 0.0–149.0)
VLDL: 35.6 mg/dL (ref 0.0–40.0)

## 2022-10-02 LAB — MICROALBUMIN / CREATININE URINE RATIO
Creatinine,U: 269.3 mg/dL
Microalb Creat Ratio: 6.6 mg/g (ref 0.0–30.0)
Microalb, Ur: 17.7 mg/dL — ABNORMAL HIGH (ref 0.0–1.9)

## 2022-10-02 LAB — POCT GLYCOSYLATED HEMOGLOBIN (HGB A1C): Hemoglobin A1C: 6.3 % — AB (ref 4.0–5.6)

## 2022-10-02 MED ORDER — ATORVASTATIN CALCIUM 10 MG PO TABS: 10.0000 mg | ORAL_TABLET | Freq: Every day | ORAL | 3 refills | Status: DC

## 2022-10-02 MED ORDER — BASAGLAR KWIKPEN 100 UNIT/ML ~~LOC~~ SOPN: 18.0000 [IU] | PEN_INJECTOR | Freq: Every day | SUBCUTANEOUS | 3 refills | Status: DC

## 2022-10-02 MED ORDER — METFORMIN HCL ER 500 MG PO TB24: ORAL_TABLET | ORAL | 3 refills | Status: DC

## 2022-10-02 MED ORDER — INSULIN PEN NEEDLE 32G X 4 MM MISC: 1.0000 | Freq: Every day | 3 refills | Status: DC

## 2022-10-02 MED ORDER — GLIPIZIDE 5 MG PO TABS: 5.0000 mg | ORAL_TABLET | Freq: Every day | ORAL | 3 refills | Status: DC

## 2022-10-02 NOTE — Telephone Encounter (Signed)
Patient pick up 5 boxes of Ozempic patient assistance.

## 2022-10-02 NOTE — Progress Notes (Signed)
Name: Jeanette Pierce  Age/ Sex: 57 y.o., female   MRN/ DOB: [id number], 08-13-1965     PCP: Tommie Sams, DO   Reason for Endocrinology Evaluation: Type 2 Diabetes Mellitus  Initial Endocrine Consultative Visit: 10/28/2018    PATIENT IDENTIFIER: Jeanette Pierce is a 57 y.o. female with a past medical history of T2DM and Crohn's dz ( S/p iliocolectomy 1999)  The patient has followed with Endocrinology clinic since 10/28/2018 for consultative assistance with management of her diabetes.  DIABETIC HISTORY:  Ms. Sill was diagnosed with DM in 2019, pt had a prescription for metformin but she admits to not taking it, she was eventually started on insulin 09/2018 during hospitalization for DKA. Her hemoglobin A1c has ranged from 6.2% in 2019, peaking at > 15.5% in 2020.   On her initial presentation to our clinic she was on metformin, lantus and novolog with an A1c > 15.5%   We stopped her MDI regimen by 03/2019 with an A1c of 5.9%  Restarted insulin by 09/2019 after hospitalization for COVID   She was approved for Ozempic through patient assistance 03/2022  SUBJECTIVE:   During the last visit (03/28/2022): A1c 7.6 %    Today (10/02/2022): [name] is here for a  follow up on diabetes.  She checks her blood sugars 1 times daily  I had just completed patient assistance on Ozempic  Has diarrhea with  crohn's , which has been stable  Minimal nausea but no vomiting  She has been noted with weight loss   HOME ENDOCRINE REGIMEN:  Glipizide 5 mg daily Metformin 500 mg 2 tabs BID Ozempic 0.5 mg weekly Basaglar 22  units daily  Atorvastatin 10 mg daily      METER DOWNLOAD SUMMARY: unable to download 94-186 mg/dL    DIABETIC COMPLICATIONS: Microvascular complications:  Neuropathy  Denies: CKD, retinopathy Last eye exam: Completed 08/2022   Macrovascular complications:    Denies: CAD, PVD, CVA      HISTORY:  Past Medical History:  Past Medical History:  Diagnosis Date    Diabetes mellitus without complication (HCC)    Hypertension    Migraine headache    Reflux    Past Surgical History:  Past Surgical History:  Procedure Laterality Date   CHOLECYSTECTOMY  before 1999   iliocolotomy  1999   TUBAL LIGATION  1991   Social History:  reports that she has never smoked. She has never used smokeless tobacco. She reports that she does not currently use alcohol. She reports that she does not currently use drugs. Family History:  Family History  Problem Relation Age of Onset   Heart disease Mother    COPD Father    Asthma Father    Cancer Maternal Grandfather        colon and stomach     HOME MEDICATIONS: Allergies as of 10/02/2022       Reactions   Erythromycin Nausea And Vomiting   Nausea, vomiting   Lisinopril Nausea And Vomiting        Medication List        Accurate as of October 02, 2022  9:04 AM. If you have any questions, ask your nurse or doctor.          STOP taking these medications    lisinopril 30 MG tablet Commonly known as: ZESTRIL Stopped by: Johnney Ou Quantavis Obryant       TAKE these medications    amLODipine 5 MG tablet Commonly known as: NORVASC  Take 1 tablet (5 mg total) by mouth daily.   atenolol 100 MG tablet Commonly known as: TENORMIN Take 1 tablet by mouth once daily   atorvastatin 10 MG tablet Commonly known as: LIPITOR Take 1 tablet (10 mg total) by mouth daily.   Basaglar KwikPen 100 UNIT/ML DIAL 22 UNITS AND INJECT UNDER THE SKIN DAILY. MAX DAILY DOSE 25 UNITS.   glipiZIDE 5 MG tablet Commonly known as: GLUCOTROL Take 1 tablet (5 mg total) by mouth 2 (two) times daily before a meal. What changed: when to take this   ibuprofen 800 MG tablet Commonly known as: ADVIL TAKE 1 TABLET BY MOUTH EVERY 8 HOURS AS NEEDED   imipramine 25 MG tablet Commonly known as: TOFRANIL Take 4 tablets (100 mg total) by mouth at bedtime.   Insulin Pen Needle 32G X 4 MM Misc 1 Device by Does not apply route daily in  the afternoon.   metFORMIN 500 MG 24 hr tablet Commonly known as: GLUCOPHAGE-XR TAKE 2 TABLETS BY MOUTH TWICE DAILY WITH A MEAL   Ozempic (0.25 or 0.5 MG/DOSE) 2 MG/3ML Sopn Generic drug: Semaglutide(0.25 or 0.5MG /DOS) Inject into the skin.   rizatriptan 10 MG tablet Commonly known as: MAXALT Take 1 tablet (10 mg total) by mouth daily as needed (for migraine pain).         OBJECTIVE:   Vital Signs: BP 128/82 (BP Location: Left Arm, Patient Position: Sitting, Cuff Size: Large)   Pulse 60   Ht 5\' 2"  (1.575 m)   Wt 219 lb (99.3 kg)   SpO2 94%   BMI 40.06 kg/m   Wt Readings from Last 3 Encounters:  10/02/22 219 lb (99.3 kg)  08/16/22 219 lb 6.4 oz (99.5 kg)  05/17/22 227 lb (103 kg)     Exam: General: Pt appears well and is in NAD  Lungs: Clear with good BS bilat   Heart: RRR   Abdomen: Soft, nontender  Extremities: No pretibial edema.   Neuro: MS is good with appropriate affect, pt is alert and Ox3    DM foot exam: 03/28/2022  The skin of the feet is intact without sores or ulcerations. The pedal pulses are 2+ on right and 2+ on left. The sensation is intact to a screening 5.07, 10 gram monofilament bilaterally    DATA REVIEWED:  Lab Results  Component Value Date   HGBA1C 6.3 (A) 10/02/2022   HGBA1C 7.6 (A) 03/28/2022   HGBA1C 12.1 (A) 05/18/2021     Latest Reference Range & Units 10/02/22 09:06  COMPREHENSIVE METABOLIC PANEL  Rpt !  Sodium 135 - 145 mEq/L 140  Potassium 3.5 - 5.1 mEq/L 4.3  Chloride 96 - 112 mEq/L 104  CO2 19 - 32 mEq/L 26  Glucose 70 - 99 mg/dL 71  BUN 6 - 23 mg/dL 13  Creatinine 4.09 - 8.11 mg/dL 9.14  Calcium 8.4 - 78.2 mg/dL 95.6  Alkaline Phosphatase 39 - 117 U/L 65  Albumin 3.5 - 5.2 g/dL 4.7  AST 0 - 37 U/L 29  ALT 0 - 35 U/L 50 (H)  Total Protein 6.0 - 8.3 g/dL 7.7  Total Bilirubin 0.2 - 1.2 mg/dL 0.6  GFR >21.30 mL/min 76.47    Latest Reference Range & Units 10/02/22 09:06 10/02/22 10:10  Total CHOL/HDL Ratio  3    Cholesterol 0 - 200 mg/dL 865   HDL Cholesterol >78.46 mg/dL 96.29   LDL (calc) 0 - 99 mg/dL 38   MICROALB/CREAT RATIO 0.0 - 30.0 mg/g  6.6  NonHDL  73.70   Triglycerides 0.0 - 149.0 mg/dL 161.0 (H)   VLDL 0.0 - 40.0 mg/dL 96.0   (H): Data is abnormally high ASSESSMENT / PLAN / RECOMMENDATIONS:   1) Type 2 Diabetes Mellitus,Optimally  controlled, With neuropathic  complications - Most recent A1c of 6.3  %. Goal A1c < 7.0 %.    -Her A1c has improved from  7.6 %  to 6.3 % -She is on Ozempic through patient assistance, her Crohn's disease symptoms have not worsened, I have recommended increasing the dose -Will decrease Basaglar dose preemptively to prevent hypoglycemia -No changes to metformin and glipizide dose at this time -BMP, MA/CR ratio normal  MEDICATIONS: -Increase Ozempic 1 mg weekly -Continue Metformin 500 mg 2 tabs daily  -Decrease Basaglar 18 units daily  -Continue Glipizide 5 mg, 1 tablet twice daily   If she receives Ozempic: Decrease glipizide to once daily but continue metformin and Basaglar. Start Ozempic 0.25 mg weekly for 6 weeks then increase to 0.5 mg weekly   EDUCATION / INSTRUCTIONS: BG monitoring instructions: Patient is instructed to check her blood sugars 1 times a day , fasting  Call [facility] if: BG persistently < 70  I reviewed the Rule of 15 for the treatment of hypoglycemia in detail with the patient. Literature supplied.   2.Mixed Dyslipidemia    - She was started on Atorvastatin 03/2019 due to elevated Triglycerides 220 mg/dL  -Lipid panel continues to show elevated TG, most likely due to nonfasting status, LDL at goal    Medication Continue Atorvastatin 10 mg daily     F/U in 6 months    Signed electronically by: Lyndle Herrlich, MD  [facility]  [facility] [address] [place], [place] [address] Phone: [phone] FAX: [phone]   CC: [place], [place],  DO [address] [place] [address] Phone: [phone]  Fax: [phone]  Return to Endocrinology clinic as below: No future appointments.

## 2022-10-02 NOTE — Patient Instructions (Signed)
-   Increase Ozempic 1 mg weekly  - Continue Metformin 500 mg, 2 tablets Twice daily  - Decrease Basaglar  18 units daily  - Continue  Glipizide 5 mg , 1 tablet before first meal of the day    HOW TO TREAT LOW BLOOD SUGARS (Blood sugar LESS THAN 70 MG/DL) Please follow the RULE OF 15 for the treatment of hypoglycemia treatment (when your (blood sugars are less than 70 mg/dL)   STEP 1: Take 15 grams of carbohydrates when your blood sugar is low, which includes:  3-4 GLUCOSE TABS  OR 3-4 OZ OF JUICE OR REGULAR SODA OR ONE TUBE OF GLUCOSE GEL    STEP 2: RECHECK blood sugar in 15 MINUTES STEP 3: If your blood sugar is still low at the 15 minute recheck --> then, go back to STEP 1 and treat AGAIN with another 15 grams of carbohydrates.

## 2022-10-14 ENCOUNTER — Encounter: Payer: Self-pay | Admitting: *Deleted

## 2022-10-18 ENCOUNTER — Telehealth: Payer: Self-pay

## 2022-10-18 NOTE — Telephone Encounter (Signed)
I LVM explaining that her patient assistance of Ozempic 4mg  #4 boxes is ready for pick up

## 2022-10-21 NOTE — Telephone Encounter (Signed)
Patient's spouse, Wanisha Delfosse, came in to office today and picked up 4 boxes of patient assistance Ozempic.

## 2022-12-10 ENCOUNTER — Other Ambulatory Visit: Payer: Self-pay | Admitting: Family Medicine

## 2022-12-20 ENCOUNTER — Other Ambulatory Visit: Payer: Self-pay | Admitting: Family Medicine

## 2023-01-07 ENCOUNTER — Other Ambulatory Visit: Payer: Self-pay | Admitting: Family Medicine

## 2023-01-10 ENCOUNTER — Telehealth: Payer: Self-pay

## 2023-01-10 NOTE — Telephone Encounter (Signed)
Left vm that Novo Nordisk patient assistance medication is ready for pick up.  Ozempic 4 boxes

## 2023-01-16 NOTE — Telephone Encounter (Signed)
Patient picked up patient assistance - [place] Ozempic 4 Boxes

## 2023-02-10 ENCOUNTER — Other Ambulatory Visit: Payer: Self-pay | Admitting: Family Medicine

## 2023-03-21 ENCOUNTER — Other Ambulatory Visit: Payer: Self-pay | Admitting: Family Medicine

## 2023-03-31 ENCOUNTER — Encounter (INDEPENDENT_AMBULATORY_CARE_PROVIDER_SITE_OTHER): Payer: Self-pay | Admitting: *Deleted

## 2023-03-31 ENCOUNTER — Ambulatory Visit: Payer: BC Managed Care – PPO | Admitting: Nurse Practitioner

## 2023-03-31 ENCOUNTER — Encounter: Payer: Self-pay | Admitting: Nurse Practitioner

## 2023-03-31 ENCOUNTER — Ambulatory Visit: Payer: BC Managed Care – PPO | Admitting: Family Medicine

## 2023-03-31 VITALS — BP 118/78 | HR 81 | Temp 97.8°F | Ht 62.0 in | Wt 220.0 lb

## 2023-03-31 DIAGNOSIS — Z1211 Encounter for screening for malignant neoplasm of colon: Secondary | ICD-10-CM

## 2023-03-31 DIAGNOSIS — Z1151 Encounter for screening for human papillomavirus (HPV): Secondary | ICD-10-CM | POA: Diagnosis not present

## 2023-03-31 DIAGNOSIS — Z01411 Encounter for gynecological examination (general) (routine) with abnormal findings: Secondary | ICD-10-CM | POA: Diagnosis not present

## 2023-03-31 DIAGNOSIS — Z01419 Encounter for gynecological examination (general) (routine) without abnormal findings: Secondary | ICD-10-CM

## 2023-03-31 DIAGNOSIS — R102 Pelvic and perineal pain: Secondary | ICD-10-CM | POA: Insufficient documentation

## 2023-03-31 DIAGNOSIS — N950 Postmenopausal bleeding: Secondary | ICD-10-CM | POA: Diagnosis not present

## 2023-03-31 DIAGNOSIS — Z124 Encounter for screening for malignant neoplasm of cervix: Secondary | ICD-10-CM | POA: Diagnosis not present

## 2023-03-31 DIAGNOSIS — S50811A Abrasion of right forearm, initial encounter: Secondary | ICD-10-CM

## 2023-03-31 DIAGNOSIS — Z23 Encounter for immunization: Secondary | ICD-10-CM

## 2023-03-31 DIAGNOSIS — R1319 Other dysphagia: Secondary | ICD-10-CM | POA: Insufficient documentation

## 2023-03-31 DIAGNOSIS — W5503XA Scratched by cat, initial encounter: Secondary | ICD-10-CM | POA: Diagnosis not present

## 2023-03-31 DIAGNOSIS — K219 Gastro-esophageal reflux disease without esophagitis: Secondary | ICD-10-CM

## 2023-03-31 NOTE — Progress Notes (Addendum)
Subjective:    Patient ID: Jeanette Pierce, female    DOB: Jun 30, 1965, 58 y.o.   MRN: [id number]  HPI The patient comes in today for a wellness visit.    A review of their health history was completed.  A review of medications was also completed.  Eating habits: described as poor; limited amount of sugary beverages but struggles with sugar cravings  Regular exercise: active lifestyle; limited walking  Specialist pt sees on regular basis: endocrinology for diabetes and foot exams  Preventative health issues were discussed.   Additional concerns: migraines stable; very rare use of Maxalt (about once a year) No menses for several years but did have some light spotting a few years ago, none since Married, same female partner; not sexually active since 2019 Gets yearly eye exams Has dental exam scheduled for this week States her cat scratched her right arm recently Last colonoscopy was in 1999 when she had iliocolectomy     Review of Systems  Constitutional:  Positive for fatigue. Negative for activity change and appetite change.  HENT:  Positive for trouble swallowing. Negative for sore throat.        Occasional feeling of something getting stuck in her throat at the upper esophagus. Occasional acid reflux.  Respiratory:  Negative for cough, chest tightness, shortness of breath and wheezing.   Cardiovascular:  Negative for chest pain.  Gastrointestinal:  Positive for diarrhea. Negative for abdominal distention, abdominal pain, constipation, nausea and vomiting.       Chronic loose stools; history of Crohns  Genitourinary:  Positive for enuresis, frequency, pelvic pain, urgency and vaginal bleeding. Negative for difficulty urinating, dysuria, genital sores and vaginal discharge.       Mainly stress incontinence with some urge incontinence. Wears light liner daily.   Skin:  Positive for wound.       Superficial cat scratches to the right arm       Objective:   Physical  Exam Vitals and nursing note reviewed. Chaperone present: Defers chaperone.  Constitutional:      General: She is not in acute distress.    Appearance: She is well-developed.  Neck:     Thyroid: No thyromegaly.     Trachea: No tracheal deviation.     Comments: Thyroid non tender to palpation. No mass or goiter noted.  Cardiovascular:     Rate and Rhythm: Normal rate and regular rhythm.     Heart sounds: Normal heart sounds. No murmur heard. Pulmonary:     Effort: Pulmonary effort is normal.     Breath sounds: Normal breath sounds.  Chest:  Breasts:    Right: No swelling, inverted nipple, mass, skin change or tenderness.     Left: No swelling, inverted nipple, mass, skin change or tenderness.  Abdominal:     General: There is no distension.     Palpations: Abdomen is soft.     Tenderness: There is no abdominal tenderness.  Genitourinary:    Exam position: Lithotomy position.     Labia:        Right: No rash, tenderness or lesion.        Left: No rash, tenderness or lesion.      Vagina: No vaginal discharge, erythema, tenderness or bleeding.     Cervix: No cervical motion tenderness, discharge, lesion or erythema.     Comments: Tenderness noted in the right pelvic area. Possible small hernia. Exam limited due to abdominal girth. Significant scar tissue noted from previous surgeries. Vaginal  walls mildly prolapsed. No uterine or bladder prolapse noted with Valsalva. Mild spotting noted with PAP smear.  Musculoskeletal:     Cervical back: Normal range of motion and neck supple.  Lymphadenopathy:     Cervical: No cervical adenopathy.     Upper Body:     Right upper body: No supraclavicular, axillary or pectoral adenopathy.     Left upper body: No supraclavicular, axillary or pectoral adenopathy.  Skin:    General: Skin is warm and dry.     Comments: Several linear superficial abrasions noted right arm. No signs of infection.   Neurological:     Mental Status: She is alert and  oriented to person, place, and time.  Psychiatric:        Mood and Affect: Mood normal.        Behavior: Behavior normal.        Thought Content: Thought content normal.        Judgment: Judgment normal.    Today's Vitals   03/31/23 0844  BP: 118/78  Pulse: 81  Temp: 97.8 F (36.6 C)  SpO2: 98%  Weight: 220 lb (99.8 kg)  Height: 5\' 2"  (1.575 m)   Body mass index is 40.24 kg/m.         Assessment & Plan:   Problem List Items Addressed This Visit       Digestive   Esophageal dysphagia   Relevant Orders   Ambulatory referral to Gastroenterology   GERD (gastroesophageal reflux disease)   Relevant Orders   Ambulatory referral to Gastroenterology     Musculoskeletal and Integument   Cat scratch of forearm, initial encounter   Relevant Orders   Tdap vaccine greater than or equal to 7yo IM (Completed)     Other   Pelvic pain   Post-menopausal bleeding   Relevant Orders   US Pelvis Complete   Other Visit Diagnoses       Well woman exam    -  Primary     Screening for cervical cancer       Relevant Orders   IGP, Aptima HPV     Screening for HPV (human papillomavirus)       Relevant Orders   IGP, Aptima HPV     Screen for colon cancer       Relevant Orders   Ambulatory referral to Gastroenterology      Refer to GI for screening colonoscopy and dysphagia.  Pelvic US to evaluate pelvic pain and post menopausal spotting.  Tdap today. Contact office if any signs of fever or infection in the wounds.  Encouraged regular walking program. Also avoid having sugars and trigger foods in her environment.  Continue follow up with endocrinology (foot exam done there). Patient to schedule her own mammogram in the spring.  Return in about 1 year (around 03/30/2024) for physical.

## 2023-04-03 ENCOUNTER — Encounter: Payer: Self-pay | Admitting: Nurse Practitioner

## 2023-04-03 LAB — IGP, APTIMA HPV: HPV Aptima: NEGATIVE

## 2023-04-03 NOTE — Progress Notes (Signed)
 Name: Jeanette Pierce  Age/ Sex: 58 y.o., female   MRN/ DOB: [id number], [birth year]     PCP: Mcmahill, Jayce G, DO   Reason for Endocrinology Evaluation: Type 2 Diabetes Mellitus  Initial Endocrine Consultative Visit: 10/28/2018    PATIENT IDENTIFIER: Jeanette Pierce is a 58 y.o. female with a past medical history of T2DM and Crohn's dz ( S/p iliocolectomy 1999)  The patient has followed with Endocrinology clinic since 10/28/2018 for consultative assistance with management of her diabetes.  DIABETIC HISTORY:  Jeanette Pierce was diagnosed with DM in 2019, pt had a prescription for metformin  but she admits to not taking it, she was eventually started on insulin  09/2018 during hospitalization for DKA. Her hemoglobin A1c has ranged from 6.2% in 2019, peaking at > 15.5% in 2020.   On her initial presentation to our clinic she was on metformin , lantus  and novolog  with an A1c > 15.5%   We stopped her MDI regimen by 03/2019 with an A1c of 5.9%  Restarted insulin  by 09/2019 after hospitalization for COVID   She was approved for Ozempic  through patient assistance 03/2022  SUBJECTIVE:   During the last visit (10/02/2022): A1c 6.3 %    Today (04/04/2023): Jeanette Pierce is here for a  follow up on diabetes.  She checks her blood sugars 1 times daily. No hypoglycemia    Has diarrhea with crohn's , which has been stable while on Ozempic   She did episodes of gas but these have resolved She has a pending appointment with GI for evaluation of dysphagia Continues with Minimal nausea but no vomiting  Weight stable   HOME ENDOCRINE REGIMEN:  Glipizide  5 mg daily  Metformin  500 mg 2 tabs BID Ozempic  1 mg weekly Basaglar  18  units daily  Atorvastatin  10 mg daily      METER DOWNLOAD SUMMARY: unable to download 99-153 mg/dL    DIABETIC COMPLICATIONS: Microvascular complications:  Neuropathy  Denies: CKD, retinopathy Last eye exam: Completed 08/2022   Macrovascular complications:    Denies: CAD, PVD,  CVA      HISTORY:  Past Medical History:  Past Medical History:  Diagnosis Date   Diabetes mellitus without complication (HCC)    Hypertension    Migraine headache    Reflux    Past Surgical History:  Past Surgical History:  Procedure Laterality Date   CHOLECYSTECTOMY  before 1999   iliocolotomy  1999   TUBAL LIGATION  1991   Social History:  reports that she has never smoked. She has never used smokeless tobacco. She reports that she does not currently use alcohol. She reports that she does not currently use drugs. Family History:  Family History  Problem Relation Age of Onset   Heart disease Mother    COPD Father    Asthma Father    Cancer Maternal Grandfather        colon and stomach     HOME MEDICATIONS: Allergies as of 04/04/2023       Reactions   Erythromycin Nausea And Vomiting   Nausea, vomiting   Lisinopril  Nausea And Vomiting        Medication List        Accurate as of April 04, 2023  8:22 AM. If you have any questions, ask your nurse or doctor.          amLODipine  5 MG tablet Commonly known as: NORVASC  Take 1 tablet (5 mg total) by mouth daily.   atenolol  100 MG tablet Commonly known  as: TENORMIN  Take 1 tablet by mouth once daily   atorvastatin  10 MG tablet Commonly known as: LIPITOR Take 1 tablet (10 mg total) by mouth daily.   Basaglar  KwikPen 100 UNIT/ML Inject 18 Units into the skin daily.   glipiZIDE  5 MG tablet Commonly known as: GLUCOTROL  Take 1 tablet (5 mg total) by mouth daily before breakfast.   ibuprofen  800 MG tablet Commonly known as: ADVIL  TAKE 1 TABLET BY MOUTH EVERY 8 HOURS AS NEEDED   imipramine  25 MG tablet Commonly known as: TOFRANIL  TAKE 4 TABLETS BY MOUTH AT BEDTIME   Insulin  Pen Needle 32G X 4 MM Misc 1 Device by Does not apply route daily in the afternoon.   metFORMIN  500 MG 24 hr tablet Commonly known as: GLUCOPHAGE -XR TAKE 2 TABLETS BY MOUTH TWICE DAILY WITH A MEAL   Ozempic  (0.25 or 0.5  MG/DOSE) 2 MG/3ML Sopn Generic drug: Semaglutide (0.25 or 0.5MG /DOS) Inject 1 mg into the skin once a week.   rizatriptan  10 MG tablet Commonly known as: MAXALT  Take 1 tablet (10 mg total) by mouth daily as needed (for migraine pain).         OBJECTIVE:   Vital Signs: BP 124/80 (BP Location: Left Arm, Patient Position: Sitting, Cuff Size: Normal)   Pulse 70   Ht 5' 2 (1.575 m)   Wt 218 lb (98.9 kg)   SpO2 97%   BMI 39.87 kg/m   Wt Readings from Last 3 Encounters:  04/04/23 218 lb (98.9 kg)  03/31/23 220 lb (99.8 kg)  10/02/22 219 lb (99.3 kg)     Exam: General: Pt appears well and is in NAD  Lungs: Clear with good BS bilat   Heart: RRR   Abdomen: Soft, nontender  Extremities: No pretibial edema.   Neuro: MS is good with appropriate affect, pt is alert and Ox3    DM foot exam: 04/04/2023  The skin of the feet is intact without sores or ulcerations. The pedal pulses are 2+ on right and 2+ on left. The sensation is intact to a screening 5.07, 10 gram monofilament bilaterally    DATA REVIEWED:  Lab Results  Component Value Date   HGBA1C 6.6 (A) 04/04/2023   HGBA1C 6.3 (A) 10/02/2022   HGBA1C 7.6 (A) 03/28/2022     Latest Reference Range & Units 10/02/22 09:06  COMPREHENSIVE METABOLIC PANEL  Rpt !  Sodium 135 - 145 mEq/L 140  Potassium 3.5 - 5.1 mEq/L 4.3  Chloride 96 - 112 mEq/L 104  CO2 19 - 32 mEq/L 26  Glucose 70 - 99 mg/dL 71  BUN 6 - 23 mg/dL 13  Creatinine 9.59 - 8.79 mg/dL 9.13  Calcium  8.4 - 10.5 mg/dL 89.7  Alkaline Phosphatase 39 - 117 U/L 65  Albumin 3.5 - 5.2 Pierce/dL 4.7  AST 0 - 37 U/L 29  ALT 0 - 35 U/L 50 (H)  Total Protein 6.0 - 8.3 Pierce/dL 7.7  Total Bilirubin 0.2 - 1.2 mg/dL 0.6  GFR >39.99 mL/min 76.47    Latest Reference Range & Units 10/02/22 09:06 10/02/22 10:10  Total CHOL/HDL Ratio  3   Cholesterol 0 - 200 mg/dL 877   HDL Cholesterol >60.99 mg/dL 51.69   LDL (calc) 0 - 99 mg/dL 38   MICROALB/CREAT RATIO 0.0 - 30.0 mg/Pierce  6.6   NonHDL  73.70   Triglycerides 0.0 - 149.0 mg/dL 821.9 (H)   VLDL 0.0 - 40.0 mg/dL 64.3   (H): Data is abnormally high ASSESSMENT / PLAN / RECOMMENDATIONS:  1) Type 2 Diabetes Mellitus,Optimally  controlled, With neuropathic  complications - Most recent A1c of 6.6  %. Goal A1c < 7.0 %.    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 Literature supplied.   2.Mixed Dyslipidemia    - She was started on Atorvastatin  03/2019 due to elevated Triglycerides 220 mg/dL  -LDL at goal, slight elevation of triglycerides    Medication Continue Atorvastatin  10 mg daily     F/U in 6 months    Signed electronically by: Stefano Redgie Butts, MD  [facility] Endocrinology  [facility] [address] [place] [place], [place] [address] Phone: [phone] FAX: [phone]   CC: Jacelyn, Cuen, DO [address] [place] [address] Phone: [phone]  Fax: [phone]  Return to Endocrinology clinic as below: Future Appointments  Date Time Provider Department Center  04/04/2023  8:30 AM Idell Hissong, Donell Redgie, MD [id number] None  04/08/2023  2:30 PM [facility]  2 [facility]  [place] [facility]  04/09/2023  2:50 PM Pierce, Deatrice FALCON, MD [facility] None

## 2023-04-04 ENCOUNTER — Ambulatory Visit: Payer: BC Managed Care – PPO | Admitting: Internal Medicine

## 2023-04-04 ENCOUNTER — Encounter: Payer: Self-pay | Admitting: Internal Medicine

## 2023-04-04 VITALS — BP 124/80 | HR 70 | Ht 62.0 in | Wt 218.0 lb

## 2023-04-04 DIAGNOSIS — E785 Hyperlipidemia, unspecified: Secondary | ICD-10-CM

## 2023-04-04 DIAGNOSIS — E1142 Type 2 diabetes mellitus with diabetic polyneuropathy: Secondary | ICD-10-CM

## 2023-04-04 DIAGNOSIS — Z794 Long term (current) use of insulin: Secondary | ICD-10-CM | POA: Diagnosis not present

## 2023-04-04 LAB — POCT GLYCOSYLATED HEMOGLOBIN (HGB A1C): Hemoglobin A1C: 6.6 % — AB (ref 4.0–5.6)

## 2023-04-04 MED ORDER — GLIPIZIDE 5 MG PO TABS: 5.0000 mg | ORAL_TABLET | Freq: Every day | ORAL | 3 refills | Status: DC

## 2023-04-04 MED ORDER — METFORMIN HCL ER 500 MG PO TB24: 1000.0000 mg | ORAL_TABLET | Freq: Two times a day (BID) | ORAL | 3 refills | Status: DC

## 2023-04-04 MED ORDER — BASAGLAR KWIKPEN 100 UNIT/ML ~~LOC~~ SOPN: 14.0000 [IU] | PEN_INJECTOR | Freq: Every day | SUBCUTANEOUS | 3 refills | Status: DC

## 2023-04-04 MED ORDER — ATORVASTATIN CALCIUM 10 MG PO TABS: 10.0000 mg | ORAL_TABLET | Freq: Every day | ORAL | 3 refills | Status: DC

## 2023-04-04 MED ORDER — INSULIN PEN NEEDLE 32G X 4 MM MISC: 1.0000 | Freq: Every day | 3 refills | Status: DC

## 2023-04-04 NOTE — Patient Instructions (Signed)
-   Increase Ozempic  2 mg weekly  - Continue Metformin  500 mg, 2 tablets Twice daily  - Decrease Basaglar   14 units daily  - Continue  Glipizide  5 mg , 1 tablet before first meal of the day    HOW TO TREAT LOW BLOOD SUGARS (Blood sugar LESS THAN 70 MG/DL) Please follow the RULE OF 15 for the treatment of hypoglycemia treatment (when your (blood sugars are less than 70 mg/dL)   STEP 1: Take 15 grams of carbohydrates when your blood sugar is low, which includes:  3-4 GLUCOSE TABS  OR 3-4 OZ OF JUICE OR REGULAR SODA OR ONE TUBE OF GLUCOSE GEL    STEP 2: RECHECK blood sugar in 15 MINUTES STEP 3: If your blood sugar is still low at the 15 minute recheck --> then, go back to STEP 1 and treat AGAIN with another 15 grams of carbohydrates.

## 2023-04-08 ENCOUNTER — Ambulatory Visit (HOSPITAL_COMMUNITY)
Admission: RE | Admit: 2023-04-08 | Discharge: 2023-04-08 | Disposition: A | Discharge status: Home / Self Care | Payer: BC Managed Care – PPO | Source: Ambulatory Visit | Attending: Nurse Practitioner | Admitting: Nurse Practitioner

## 2023-04-08 DIAGNOSIS — N854 Malposition of uterus: Secondary | ICD-10-CM | POA: Diagnosis not present

## 2023-04-08 DIAGNOSIS — N950 Postmenopausal bleeding: Secondary | ICD-10-CM | POA: Diagnosis not present

## 2023-04-09 ENCOUNTER — Ambulatory Visit (INDEPENDENT_AMBULATORY_CARE_PROVIDER_SITE_OTHER): Payer: BC Managed Care – PPO | Admitting: Gastroenterology

## 2023-04-09 ENCOUNTER — Encounter (INDEPENDENT_AMBULATORY_CARE_PROVIDER_SITE_OTHER): Payer: Self-pay | Admitting: Gastroenterology

## 2023-04-09 VITALS — BP 130/83 | HR 85 | Temp 98.1°F | Ht 62.0 in | Wt 218.4 lb

## 2023-04-09 DIAGNOSIS — R1319 Other dysphagia: Secondary | ICD-10-CM

## 2023-04-09 DIAGNOSIS — R7989 Other specified abnormal findings of blood chemistry: Secondary | ICD-10-CM

## 2023-04-09 DIAGNOSIS — K760 Fatty (change of) liver, not elsewhere classified: Secondary | ICD-10-CM | POA: Diagnosis not present

## 2023-04-09 DIAGNOSIS — Z8719 Personal history of other diseases of the digestive system: Secondary | ICD-10-CM | POA: Diagnosis not present

## 2023-04-09 DIAGNOSIS — K50012 Crohn's disease of small intestine with intestinal obstruction: Secondary | ICD-10-CM | POA: Insufficient documentation

## 2023-04-09 DIAGNOSIS — R1310 Dysphagia, unspecified: Secondary | ICD-10-CM

## 2023-04-09 DIAGNOSIS — R748 Abnormal levels of other serum enzymes: Secondary | ICD-10-CM | POA: Insufficient documentation

## 2023-04-09 DIAGNOSIS — K5000 Crohn's disease of small intestine without complications: Secondary | ICD-10-CM | POA: Insufficient documentation

## 2023-04-09 MED ORDER — SUTAB 1479-225-188 MG PO TABS: ORAL_TABLET | ORAL | 0 refills | Status: DC

## 2023-04-09 NOTE — Patient Instructions (Signed)
It was very nice to meet you today, as dicussed with will plan for the following :  1) Upper endoscopy and colonoscopy  2) Lab work, ultrasound and MRE Abdomen and Pelvis

## 2023-04-09 NOTE — Progress Notes (Signed)
[name] , M.D. Gastroenterology & Hepatology [facility] Gastroenterology [address] [place], [place] [address] Primary Care Physician: Abraham, Margulies, DO [address] [place] [address]  Chief Complaint: Dysphagia, GERD, elevated liver enzymes, history of Crohn's disease and screening colonoscopy  History of Present Illness:  Jeanette Pierce is a 58 y.o. female with history of ileo-colonic Crohns disease s/p ileo-cecal  resection 1999 , currently not on any maintenance medications  who presents for evaluation of Dysphagia, GERD, elevated liver enzymes, history of Crohn's disease and screening colonoscopy.  Patient reports that she has 2-3 bowel movements daily alternating from solid to liquid but has been like this for many years.  Patient denies any blood per rectum but has occasional left upper quadrant abdominal discomfort and does feel like a "knot".  Patient is unsure on what type of medical treatment she received records disease but was only on pills and never got any injections.  Since 2000 she has never been on any maintenance therapy or followed up with any GI  Patient reports solid food dysphagia as if food getting stuck in middle of the chest with occasional regurgitation The patient denies having any nausea, vomiting, fever, chills, hematochezia, melena, hematemesis, abdominal distention, , jaundice, pruritus or weight loss.  Last UJW:JXBJ Last Colonoscopy:2000  FHx: neg for any gastrointestinal/liver disease, no malignancies Social: neg smoking, alcohol or illicit drug use Surgical: Ex lap with ileocecal resection, cholecystectomy  Past Medical History: Past Medical History:  Diagnosis Date   Diabetes mellitus without complication (HCC)    Hypertension    Migraine headache    Reflux     Past Surgical History: Past Surgical History:  Procedure Laterality Date   CHOLECYSTECTOMY  before 1999   iliocolotomy  1999    TUBAL LIGATION  1991    Family History: Family History  Problem Relation Age of Onset   Heart disease Mother    COPD Father    Asthma Father    Cancer Maternal Grandfather        colon and stomach    Social History: Social History   Tobacco Use  Smoking Status Never  Smokeless Tobacco Never   Social History   Substance and Sexual Activity  Alcohol Use Not Currently   Social History   Substance and Sexual Activity  Drug Use Not Currently    Allergies: Allergies  Allergen Reactions   Erythromycin Nausea And Vomiting    Nausea, vomiting   Lisinopril Nausea And Vomiting    Medications: Current Outpatient Medications  Medication Sig Dispense Refill   amLODipine (NORVASC) 5 MG tablet Take 1 tablet (5 mg total) by mouth daily. 90 tablet 3   atenolol (TENORMIN) 100 MG tablet Take 1 tablet by mouth once daily 90 tablet 0   atorvastatin (LIPITOR) 10 MG tablet Take 1 tablet (10 mg total) by mouth daily. 90 tablet 3   glipiZIDE (GLUCOTROL) 5 MG tablet Take 1 tablet (5 mg total) by mouth daily before breakfast. 90 tablet 3   ibuprofen (ADVIL) 800 MG tablet TAKE 1 TABLET BY MOUTH EVERY 8 HOURS AS NEEDED 30 tablet 0   imipramine (TOFRANIL) 25 MG tablet TAKE 4 TABLETS BY MOUTH AT BEDTIME 360 tablet 1   Insulin Glargine (BASAGLAR KWIKPEN) 100 UNIT/ML Inject 14 Units into the skin daily. 15 mL 3   Insulin Pen Needle 32G X 4 MM MISC 1 Device by Does not apply route daily in the afternoon. 100 each  3   metFORMIN (GLUCOPHAGE-XR) 500 MG 24 hr tablet Take 2 tablets (1,000 mg total) by mouth 2 (two) times daily with a meal. 360 tablet 3   rizatriptan (MAXALT) 10 MG tablet Take 1 tablet (10 mg total) by mouth daily as needed (for migraine pain). 10 tablet 5   Semaglutide (OZEMPIC, 2 MG/DOSE, [place]) Inject into the skin. Once a week     No current facility-administered medications for this visit.    Review of Systems: GENERAL: negative for malaise, night sweats HEENT: No changes in  hearing or vision, no nose bleeds or other nasal problems. NECK: Negative for lumps, goiter, pain and significant neck swelling RESPIRATORY: Negative for cough, wheezing CARDIOVASCULAR: Negative for chest pain, leg swelling, palpitations, orthopnea GI: SEE HPI MUSCULOSKELETAL: Negative for joint pain or swelling, back pain, and muscle pain. SKIN: Negative for lesions, rash HEMATOLOGY Negative for prolonged bleeding, bruising easily, and swollen nodes. ENDOCRINE: Negative for cold or heat intolerance, polyuria, polydipsia and goiter. NEURO: negative for tremor, gait imbalance, syncope and seizures. The remainder of the review of systems is noncontributory.   Physical Exam: BP 130/83   Pulse 85   Temp 98.1 F (36.7 C) (Oral)   Ht 5\' 2"  (1.575 m)   Wt 218 lb 6.4 oz (99.1 kg)   BMI 39.95 kg/m  GENERAL: The patient is AO x3, in no acute distress. HEENT: Head is normocephalic and atraumatic. EOMI are intact. Mouth is well hydrated and without lesions. NECK: Supple. No masses LUNGS: Clear to auscultation. No presence of rhonchi/wheezing/rales. Adequate chest expansion HEART: RRR, normal s1 and s2. ABDOMEN: Soft, nontender, no guarding, no peritoneal signs, and nondistended. BS +. No masses.  Imaging/Labs: as above     Latest Ref Rng & Units 10/24/2019    7:58 AM 10/23/2019    3:10 AM 09/30/2018    2:36 PM  CBC  WBC 4.0 - 10.5 K/uL 8.5  4.6  11.1   Hemoglobin 12.0 - 15.0 g/dL 69.6  29.5  28.4   Hematocrit 36.0 - 46.0 % 43.0  45.6  55.6   Platelets 150 - 400 K/uL 305  193  383    Lab Results  Component Value Date   FERRITIN 378 (H) 10/24/2019    I personally reviewed and interpreted the available labs, imaging and endoscopic files.   Impression and Plan:  Jeanette Pierce is a 58 y.o. female with history of ileo-colonic Crohns disease s/p ileo-cecal  resection 1999 , currently not on any maintenance medications  who presents for evaluation of Dysphagia, GERD, elevated liver  enzymes, history of Crohn's disease and screening colonoscopy.  #Dysphagia   Patient has solid food dysphagia could be esophagitis, ring, web, rule out EOE  Will plan for upper endoscopy with esophageal biopsies plus or minus dilation Will give trial of PPI thereafter  #History of small bowel Crohn's disease   Patient has history of Crohn's disease status post ileocecal resection in 1999 Having been on any maintenance therapy since then and never followed up with any gastroenterologist.  Posey Rea on the medical management she got her Crohn's at that time but was only pills and not injectables  At this time we will plan for ileocolonoscopy with random colonic biopsy to assess for any disease activity  Given history of small bowel Crohn's, will stage and assess for disease activity small bowel with MR enterography abdomen and pelvis  Will plan for maintenance therapy after above workup  #Elevated liver enzymes #MASLD  Patient has elevated  liver enzymes likely MASLD  due to BMI 40  Will plan to obtain baseline abdominal ultrasound, viral hepatitis, autoimmune serologies      - walking at a brisk pace/biking at moderate intesity 2.5-5 hours per week     - use pedometer/step counter to track activity     - goal to lose 5-10% of initial body weight     - avoid suagry drinks and juices, use zero calorie beverages     - increase water intake     - eat a low carb diet with plenty of veggies and fruit     - Get sufficient sleep 7-8 hrs nightly     - maitain active lifestyle     - avoid alcohol     - recommend 2-3 cups Coffee daily     - Counsel on lowering cholesterol by having a diet rich in vegetables,          protein (avoid red meats) and good fats(fish, salmon).   All questions were answered.      [name], MD Gastroenterology and Hepatology [facility] Gastroenterology   This chart has been completed using [facility] Dictation software, and while  attempts have been made to ensure accuracy , certain words and phrases may not be transcribed as intended

## 2023-04-09 NOTE — H&P (View-Only) (Signed)
 [name] , M.D. Gastroenterology & Hepatology [facility] Gastroenterology [address] [place], [place] [address] Primary Care Physician: Abraham, Margulies, DO [address] [place] [address]  Chief Complaint: Dysphagia, GERD, elevated liver enzymes, history of Crohn's disease and screening colonoscopy  History of Present Illness:  Jeanette Pierce is a 58 y.o. female with history of ileo-colonic Crohns disease s/p ileo-cecal  resection 1999 , currently not on any maintenance medications  who presents for evaluation of Dysphagia, GERD, elevated liver enzymes, history of Crohn's disease and screening colonoscopy.  Patient reports that she has 2-3 bowel movements daily alternating from solid to liquid but has been like this for many years.  Patient denies any blood per rectum but has occasional left upper quadrant abdominal discomfort and does feel like a "knot".  Patient is unsure on what type of medical treatment she received records disease but was only on pills and never got any injections.  Since 2000 she has never been on any maintenance therapy or followed up with any GI  Patient reports solid food dysphagia as if food getting stuck in middle of the chest with occasional regurgitation The patient denies having any nausea, vomiting, fever, chills, hematochezia, melena, hematemesis, abdominal distention, , jaundice, pruritus or weight loss.  Last UJW:JXBJ Last Colonoscopy:2000  FHx: neg for any gastrointestinal/liver disease, no malignancies Social: neg smoking, alcohol or illicit drug use Surgical: Ex lap with ileocecal resection, cholecystectomy  Past Medical History: Past Medical History:  Diagnosis Date   Diabetes mellitus without complication (HCC)    Hypertension    Migraine headache    Reflux     Past Surgical History: Past Surgical History:  Procedure Laterality Date   CHOLECYSTECTOMY  before 1999   iliocolotomy  1999    TUBAL LIGATION  1991    Family History: Family History  Problem Relation Age of Onset   Heart disease Mother    COPD Father    Asthma Father    Cancer Maternal Grandfather        colon and stomach    Social History: Social History   Tobacco Use  Smoking Status Never  Smokeless Tobacco Never   Social History   Substance and Sexual Activity  Alcohol Use Not Currently   Social History   Substance and Sexual Activity  Drug Use Not Currently    Allergies: Allergies  Allergen Reactions   Erythromycin Nausea And Vomiting    Nausea, vomiting   Lisinopril Nausea And Vomiting    Medications: Current Outpatient Medications  Medication Sig Dispense Refill   amLODipine (NORVASC) 5 MG tablet Take 1 tablet (5 mg total) by mouth daily. 90 tablet 3   atenolol (TENORMIN) 100 MG tablet Take 1 tablet by mouth once daily 90 tablet 0   atorvastatin (LIPITOR) 10 MG tablet Take 1 tablet (10 mg total) by mouth daily. 90 tablet 3   glipiZIDE (GLUCOTROL) 5 MG tablet Take 1 tablet (5 mg total) by mouth daily before breakfast. 90 tablet 3   ibuprofen (ADVIL) 800 MG tablet TAKE 1 TABLET BY MOUTH EVERY 8 HOURS AS NEEDED 30 tablet 0   imipramine (TOFRANIL) 25 MG tablet TAKE 4 TABLETS BY MOUTH AT BEDTIME 360 tablet 1   Insulin Glargine (BASAGLAR KWIKPEN) 100 UNIT/ML Inject 14 Units into the skin daily. 15 mL 3   Insulin Pen Needle 32G X 4 MM MISC 1 Device by Does not apply route daily in the afternoon. 100 each  3   metFORMIN (GLUCOPHAGE-XR) 500 MG 24 hr tablet Take 2 tablets (1,000 mg total) by mouth 2 (two) times daily with a meal. 360 tablet 3   rizatriptan (MAXALT) 10 MG tablet Take 1 tablet (10 mg total) by mouth daily as needed (for migraine pain). 10 tablet 5   Semaglutide (OZEMPIC, 2 MG/DOSE, [place]) Inject into the skin. Once a week     No current facility-administered medications for this visit.    Review of Systems: GENERAL: negative for malaise, night sweats HEENT: No changes in  hearing or vision, no nose bleeds or other nasal problems. NECK: Negative for lumps, goiter, pain and significant neck swelling RESPIRATORY: Negative for cough, wheezing CARDIOVASCULAR: Negative for chest pain, leg swelling, palpitations, orthopnea GI: SEE HPI MUSCULOSKELETAL: Negative for joint pain or swelling, back pain, and muscle pain. SKIN: Negative for lesions, rash HEMATOLOGY Negative for prolonged bleeding, bruising easily, and swollen nodes. ENDOCRINE: Negative for cold or heat intolerance, polyuria, polydipsia and goiter. NEURO: negative for tremor, gait imbalance, syncope and seizures. The remainder of the review of systems is noncontributory.   Physical Exam: BP 130/83   Pulse 85   Temp 98.1 F (36.7 C) (Oral)   Ht 5\' 2"  (1.575 m)   Wt 218 lb 6.4 oz (99.1 kg)   BMI 39.95 kg/m  GENERAL: The patient is AO x3, in no acute distress. HEENT: Head is normocephalic and atraumatic. EOMI are intact. Mouth is well hydrated and without lesions. NECK: Supple. No masses LUNGS: Clear to auscultation. No presence of rhonchi/wheezing/rales. Adequate chest expansion HEART: RRR, normal s1 and s2. ABDOMEN: Soft, nontender, no guarding, no peritoneal signs, and nondistended. BS +. No masses.  Imaging/Labs: as above     Latest Ref Rng & Units 10/24/2019    7:58 AM 10/23/2019    3:10 AM 09/30/2018    2:36 PM  CBC  WBC 4.0 - 10.5 K/uL 8.5  4.6  11.1   Hemoglobin 12.0 - 15.0 g/dL 69.6  29.5  28.4   Hematocrit 36.0 - 46.0 % 43.0  45.6  55.6   Platelets 150 - 400 K/uL 305  193  383    Lab Results  Component Value Date   FERRITIN 378 (H) 10/24/2019    I personally reviewed and interpreted the available labs, imaging and endoscopic files.   Impression and Plan:  Jeanette Pierce is a 58 y.o. female with history of ileo-colonic Crohns disease s/p ileo-cecal  resection 1999 , currently not on any maintenance medications  who presents for evaluation of Dysphagia, GERD, elevated liver  enzymes, history of Crohn's disease and screening colonoscopy.  #Dysphagia   Patient has solid food dysphagia could be esophagitis, ring, web, rule out EOE  Will plan for upper endoscopy with esophageal biopsies plus or minus dilation Will give trial of PPI thereafter  #History of small bowel Crohn's disease   Patient has history of Crohn's disease status post ileocecal resection in 1999 Having been on any maintenance therapy since then and never followed up with any gastroenterologist.  Posey Rea on the medical management she got her Crohn's at that time but was only pills and not injectables  At this time we will plan for ileocolonoscopy with random colonic biopsy to assess for any disease activity  Given history of small bowel Crohn's, will stage and assess for disease activity small bowel with MR enterography abdomen and pelvis  Will plan for maintenance therapy after above workup  #Elevated liver enzymes #MASLD  Patient has elevated  liver enzymes likely MASLD  due to BMI 40  Will plan to obtain baseline abdominal ultrasound, viral hepatitis, autoimmune serologies      - walking at a brisk pace/biking at moderate intesity 2.5-5 hours per week     - use pedometer/step counter to track activity     - goal to lose 5-10% of initial body weight     - avoid suagry drinks and juices, use zero calorie beverages     - increase water intake     - eat a low carb diet with plenty of veggies and fruit     - Get sufficient sleep 7-8 hrs nightly     - maitain active lifestyle     - avoid alcohol     - recommend 2-3 cups Coffee daily     - Counsel on lowering cholesterol by having a diet rich in vegetables,          protein (avoid red meats) and good fats(fish, salmon).   All questions were answered.      [name], MD Gastroenterology and Hepatology [facility] Gastroenterology   This chart has been completed using [facility] Dictation software, and while  attempts have been made to ensure accuracy , certain words and phrases may not be transcribed as intended

## 2023-04-10 ENCOUNTER — Encounter: Payer: Self-pay | Admitting: Nurse Practitioner

## 2023-04-10 ENCOUNTER — Encounter (INDEPENDENT_AMBULATORY_CARE_PROVIDER_SITE_OTHER): Payer: Self-pay

## 2023-04-10 DIAGNOSIS — R9389 Abnormal findings on diagnostic imaging of other specified body structures: Secondary | ICD-10-CM | POA: Insufficient documentation

## 2023-04-11 DIAGNOSIS — K760 Fatty (change of) liver, not elsewhere classified: Secondary | ICD-10-CM | POA: Diagnosis not present

## 2023-04-11 DIAGNOSIS — K50012 Crohn's disease of small intestine with intestinal obstruction: Secondary | ICD-10-CM | POA: Diagnosis not present

## 2023-04-11 DIAGNOSIS — R748 Abnormal levels of other serum enzymes: Secondary | ICD-10-CM | POA: Diagnosis not present

## 2023-04-14 ENCOUNTER — Encounter (INDEPENDENT_AMBULATORY_CARE_PROVIDER_SITE_OTHER): Payer: Self-pay | Admitting: Gastroenterology

## 2023-04-14 NOTE — Progress Notes (Signed)
  Ferritin 33% saturation 14 (iron deficient)  ALT 38  Hep a negative Hep B surface antibody and core antibody negative.  Nonimmune Hepatitis C negative  HIV negative Negative AMA, ASMA INR 1

## 2023-04-18 LAB — COMPREHENSIVE METABOLIC PANEL
ALT: 38 [IU]/L — ABNORMAL HIGH (ref 0–32)
AST: 26 [IU]/L (ref 0–40)
Albumin: 4.4 g/dL (ref 3.8–4.9)
Alkaline Phosphatase: 80 [IU]/L (ref 44–121)
BUN/Creatinine Ratio: 20 (ref 9–23)
BUN: 16 mg/dL (ref 6–24)
Bilirubin Total: 0.4 mg/dL (ref 0.0–1.2)
CO2: 21 mmol/L (ref 20–29)
Calcium: 9.6 mg/dL (ref 8.7–10.2)
Chloride: 104 mmol/L (ref 96–106)
Creatinine, Ser: 0.79 mg/dL (ref 0.57–1.00)
Globulin, Total: 2.5 g/dL (ref 1.5–4.5)
Glucose: 131 mg/dL — ABNORMAL HIGH (ref 70–99)
Potassium: 4.6 mmol/L (ref 3.5–5.2)
Sodium: 140 mmol/L (ref 134–144)
Total Protein: 6.9 g/dL (ref 6.0–8.5)
eGFR: 88 mL/min/{1.73_m2} (ref >59)

## 2023-04-18 LAB — PROTIME-INR
INR: 1 (ref 0.9–1.2)
Prothrombin Time: 10.9 s (ref 9.1–12.0)

## 2023-04-18 LAB — HEPATITIS B SURFACE ANTIBODY,QUALITATIVE: Hep B Surface Ab, Qual: NONREACTIVE

## 2023-04-18 LAB — HEPATITIS C ANTIBODY: Hep C Virus Ab: NONREACTIVE

## 2023-04-18 LAB — IRON,TIBC AND FERRITIN PANEL
Ferritin: 33 ng/mL (ref 15–150)
Iron Saturation: 14 % — ABNORMAL LOW (ref 15–55)
Iron: 60 ug/dL (ref 27–159)
Total Iron Binding Capacity: 418 ug/dL (ref 250–450)
UIBC: 358 ug/dL (ref 131–425)

## 2023-04-18 LAB — ANTI-SMOOTH MUSCLE ANTIBODY, IGG: Smooth Muscle Ab: 3 U (ref 0–19)

## 2023-04-18 LAB — MITOCHONDRIAL ANTIBODIES

## 2023-04-18 LAB — HEPATITIS B SURFACE ANTIGEN: Hepatitis B Surface Ag: NEGATIVE

## 2023-04-18 LAB — HEPATITIS B CORE ANTIBODY, TOTAL: Hep B Core Total Ab: NEGATIVE

## 2023-04-18 LAB — ANA

## 2023-04-18 LAB — HEPATITIS A ANTIBODY, TOTAL: hep A Total Ab: NEGATIVE

## 2023-04-18 LAB — HIV ANTIBODY (ROUTINE TESTING W REFLEX): HIV Screen 4th Generation wRfx: NONREACTIVE

## 2023-04-28 ENCOUNTER — Encounter (HOSPITAL_COMMUNITY): Payer: Self-pay

## 2023-04-28 ENCOUNTER — Encounter (HOSPITAL_COMMUNITY)
Admission: RE | Admit: 2023-04-28 | Discharge: 2023-04-28 | Disposition: A | Discharge status: Home / Self Care | Payer: BC Managed Care – PPO | Source: Ambulatory Visit | Attending: Gastroenterology | Admitting: Gastroenterology

## 2023-04-28 HISTORY — DX: Gastro-esophageal reflux disease without esophagitis: K21.9

## 2023-04-28 HISTORY — DX: Nausea with vomiting, unspecified: R11.2

## 2023-04-28 HISTORY — DX: Other specified postprocedural states: R11.2

## 2023-04-28 HISTORY — DX: Fatty (change of) liver, not elsewhere classified: K76.0

## 2023-04-28 HISTORY — DX: Other specified postprocedural states: Z98.890

## 2023-05-01 ENCOUNTER — Ambulatory Visit (HOSPITAL_COMMUNITY): Admitting: Anesthesiology

## 2023-05-01 ENCOUNTER — Other Ambulatory Visit: Payer: Self-pay

## 2023-05-01 ENCOUNTER — Encounter (INDEPENDENT_AMBULATORY_CARE_PROVIDER_SITE_OTHER): Payer: Self-pay | Admitting: *Deleted

## 2023-05-01 ENCOUNTER — Encounter (HOSPITAL_COMMUNITY)
Admission: RE | Disposition: A | Discharge status: Home / Self Care | Payer: Self-pay | Source: Home / Self Care | Attending: Gastroenterology

## 2023-05-01 ENCOUNTER — Encounter (HOSPITAL_COMMUNITY): Payer: Self-pay | Admitting: Gastroenterology

## 2023-05-01 ENCOUNTER — Ambulatory Visit (HOSPITAL_COMMUNITY)
Admission: RE | Admit: 2023-05-01 | Discharge: 2023-05-01 | Disposition: A | Discharge status: Home / Self Care | Payer: BC Managed Care – PPO | Attending: Gastroenterology | Admitting: Gastroenterology

## 2023-05-01 DIAGNOSIS — Z7985 Long-term (current) use of injectable non-insulin antidiabetic drugs: Secondary | ICD-10-CM | POA: Insufficient documentation

## 2023-05-01 DIAGNOSIS — K5000 Crohn's disease of small intestine without complications: Secondary | ICD-10-CM | POA: Diagnosis present

## 2023-05-01 DIAGNOSIS — I10 Essential (primary) hypertension: Secondary | ICD-10-CM | POA: Diagnosis not present

## 2023-05-01 DIAGNOSIS — K200 Eosinophilic esophagitis: Secondary | ICD-10-CM

## 2023-05-01 DIAGNOSIS — Z7984 Long term (current) use of oral hypoglycemic drugs: Secondary | ICD-10-CM | POA: Insufficient documentation

## 2023-05-01 DIAGNOSIS — K648 Other hemorrhoids: Secondary | ICD-10-CM | POA: Insufficient documentation

## 2023-05-01 DIAGNOSIS — E119 Type 2 diabetes mellitus without complications: Secondary | ICD-10-CM | POA: Insufficient documentation

## 2023-05-01 DIAGNOSIS — Z980 Intestinal bypass and anastomosis status: Secondary | ICD-10-CM | POA: Diagnosis not present

## 2023-05-01 DIAGNOSIS — Z8719 Personal history of other diseases of the digestive system: Secondary | ICD-10-CM | POA: Diagnosis not present

## 2023-05-01 DIAGNOSIS — K2100 Gastro-esophageal reflux disease with esophagitis, without bleeding: Secondary | ICD-10-CM | POA: Insufficient documentation

## 2023-05-01 DIAGNOSIS — Z1211 Encounter for screening for malignant neoplasm of colon: Secondary | ICD-10-CM | POA: Diagnosis not present

## 2023-05-01 DIAGNOSIS — K760 Fatty (change of) liver, not elsewhere classified: Secondary | ICD-10-CM | POA: Insufficient documentation

## 2023-05-01 DIAGNOSIS — K2950 Unspecified chronic gastritis without bleeding: Secondary | ICD-10-CM | POA: Diagnosis not present

## 2023-05-01 DIAGNOSIS — K635 Polyp of colon: Secondary | ICD-10-CM | POA: Diagnosis not present

## 2023-05-01 DIAGNOSIS — Z79899 Other long term (current) drug therapy: Secondary | ICD-10-CM | POA: Insufficient documentation

## 2023-05-01 DIAGNOSIS — R1310 Dysphagia, unspecified: Secondary | ICD-10-CM | POA: Diagnosis not present

## 2023-05-01 DIAGNOSIS — K2289 Other specified disease of esophagus: Secondary | ICD-10-CM | POA: Diagnosis not present

## 2023-05-01 DIAGNOSIS — K3189 Other diseases of stomach and duodenum: Secondary | ICD-10-CM | POA: Insufficient documentation

## 2023-05-01 DIAGNOSIS — K644 Residual hemorrhoidal skin tags: Secondary | ICD-10-CM | POA: Insufficient documentation

## 2023-05-01 DIAGNOSIS — Z794 Long term (current) use of insulin: Secondary | ICD-10-CM | POA: Insufficient documentation

## 2023-05-01 DIAGNOSIS — Z139 Encounter for screening, unspecified: Secondary | ICD-10-CM | POA: Diagnosis not present

## 2023-05-01 LAB — GLUCOSE, CAPILLARY: Glucose-Capillary: 85 mg/dL (ref 70–99)

## 2023-05-01 LAB — HM COLONOSCOPY

## 2023-05-01 SURGERY — ESOPHAGOGASTRODUODENOSCOPY (EGD) WITH PROPOFOL
Anesthesia: General

## 2023-05-01 MED ORDER — OMEPRAZOLE 40 MG PO CPDR: 40.0000 mg | DELAYED_RELEASE_CAPSULE | Freq: Two times a day (BID) | ORAL | 0 refills | Status: DC

## 2023-05-01 MED ORDER — LIDOCAINE HCL (CARDIAC) PF 100 MG/5ML IV SOSY
PREFILLED_SYRINGE | INTRAVENOUS | Status: DC | PRN
  Administered 2023-05-01: 50 mg via INTRATRACHEAL

## 2023-05-01 MED ORDER — LACTATED RINGERS IV SOLN: INTRAVENOUS | Status: DC

## 2023-05-01 MED ORDER — PROPOFOL 500 MG/50ML IV EMUL
INTRAVENOUS | Status: DC | PRN
  Administered 2023-05-01: 150 ug/kg/min via INTRAVENOUS

## 2023-05-01 MED ORDER — PROPOFOL 10 MG/ML IV BOLUS
INTRAVENOUS | Status: DC | PRN
  Administered 2023-05-01 (×2): 50 mg via INTRAVENOUS
  Administered 2023-05-01: 30 mg via INTRAVENOUS

## 2023-05-01 NOTE — Transfer of Care (Signed)
 Immediate Anesthesia Transfer of Care Note  Patient: Jeanette Pierce  Procedure(s) Performed: ESOPHAGOGASTRODUODENOSCOPY (EGD) WITH PROPOFOL COLONOSCOPY WITH PROPOFOL POLYPECTOMY  Patient Location: Short Stay  Anesthesia Type:General  Level of Consciousness: awake  Airway & Oxygen Therapy: Patient Spontanous Breathing  Post-op Assessment: Report given to RN  Post vital signs: Reviewed and stable  Last Vitals:  Vitals Value Taken Time  BP    Temp 36.6 C 05/01/23 1220  Pulse 74 05/01/23 1220  Resp 19 05/01/23 1220  SpO2 97 % 05/01/23 1220    Last Pain:  Vitals:   05/01/23 1220  TempSrc: Oral  PainSc: 0-No pain         Complications: No notable events documented.

## 2023-05-01 NOTE — Op Note (Signed)
 [facility] Patient Name: Jeanette Pierce Procedure Date: 05/01/2023 11:19 AM MRN: [id number] Date of Birth: Nov 14, 1965 Attending MD: Sanjuan Dame , MD, [id number] CSN: [id number] Age: 58 Admit Type: Outpatient Procedure:                Colonoscopy Indications:              Screening for colorectal malignant neoplasm, High                            risk colon cancer surveillance: Inflammatory bowel                            disease Providers:                Sanjuan Dame, MD, Angelica Ran, Lennice Sites                            Technician, Technician Referring MD:              Medicines:                 Complications:            No immediate complications. Estimated Blood Loss:     Estimated blood loss: none. Procedure:                Pre-Anesthesia Assessment:                           - Prior to the procedure, a History and Physical                            was performed, and patient medications and                            allergies were reviewed. The patient's tolerance of                            previous anesthesia was also reviewed. The risks                            and benefits of the procedure and the sedation                            options and risks were discussed with the patient.                            All questions were answered, and informed consent                            was obtained. Prior Anticoagulants: The patient has                            taken no anticoagulant or antiplatelet agents. ASA                            Grade Assessment: II - A patient with  mild systemic                            disease. After reviewing the risks and benefits,                            the patient was deemed in satisfactory condition to                            undergo the procedure.                           After obtaining informed consent, the colonoscope                            was passed under direct vision. Throughout the                             procedure, the patient's blood pressure, pulse, and                            oxygen saturations were monitored continuously. The                            [id number]-HQ190L ([id number]) scope was introduced through                            the anus and advanced to the the ileocolonic                            anastomosis. The colonoscopy was performed without                            difficulty. The patient tolerated the procedure                            well. The quality of the bowel preparation was                            evaluated using the BBPS [facility] Bowel Preparation                            Scale) with scores of: Right Colon = 3, Transverse                            Colon = 3 and Left Colon = 3 (entire mucosa seen                            well with no residual staining, small fragments of                            stool or opaque liquid). The total BBPS score  equals 9. The terminal ileum, ileocecal valve,                            appendiceal orifice, and rectum were photographed. Scope In: 11:54:56 AM Scope Out: 12:12:11 PM Scope Withdrawal Time: 0 hours 13 minutes 16 seconds  Total Procedure Duration: 0 hours 17 minutes 15 seconds  Findings:      Hemorrhoids were found on perianal exam.      A 5 mm polyp was found in the sigmoid colon. The polyp was sessile. The       polyp was removed with a cold snare. Resection and retrieval were       complete.      There was evidence of a prior side-to-side ileo-colonic anastomosis at       the ileocecal valve. This was patent and was characterized by healthy       appearing mucosa. The anastomosis was traversed.      There is no endoscopic evidence of inflammation or ulcerations in the       entire colon. Biopsies were obtained in the rectum, in the sigmoid       colon, in the descending colon, in the transverse colon and in the       ascending colon with cold forceps for histology.       Non-bleeding external and internal hemorrhoids were found during       retroflexion. The hemorrhoids were medium-sized.      The neo-terminal ileum appeared normal. Impression:               - Hemorrhoids found on perianal exam.                           - One 5 mm polyp in the sigmoid colon, removed with                            a cold snare. Resected and retrieved.                           - Patent side-to-side ileo-colonic anastomosis,                            characterized by healthy appearing mucosa.                           - Non-bleeding external and internal hemorrhoids.                           - The examined portion of the ileum was normal.                           - Biopsies were obtained in the rectum, in the                            sigmoid colon, in the descending colon, in the                            transverse colon and in the ascending colon. Moderate Sedation:      Per  Anesthesia Care Recommendation:           - Patient has a contact number available for                            emergencies. The signs and symptoms of potential                            delayed complications were discussed with the                            patient. Return to normal activities tomorrow.                            Written discharge instructions were provided to the                            patient.                           - Resume previous diet.                           - Continue present medications.                           - Await pathology results.                           - Repeat colonoscopy in 5 years for surveillance.                           - Return to GI clinic as previously scheduled.                           -Obtain MRE Abdomen and pelvis to stage small bowel                            disease                           -Ultraosund Abdomen Procedure Code(s):        --- Professional ---                           [phone], Colonoscopy, flexible; with  removal of                            tumor(s), polyp(s), or other lesion(s) by snare                            technique                           [id number], 59, Colonoscopy, flexible; with biopsy,                            single or  multiple Diagnosis Code(s):        --- Professional ---                           Z12.11, Encounter for screening for malignant                            neoplasm of colon                           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 All rights reserved. The codes documented in this report are preliminary and upon coder review may  be revised to meet current compliance requirements. Sanjuan Dame, MD Sanjuan Dame, MD 05/01/2023 12:40:31 PM This report has been signed electronically. Number of Addenda: 0

## 2023-05-01 NOTE — Anesthesia Preprocedure Evaluation (Signed)
 Anesthesia Evaluation  Patient identified by MRN, date of birth, ID band Patient awake    Reviewed: Allergy & Precautions, H&P , NPO status , Patient's Chart, lab work & pertinent test results, reviewed documented beta blocker date and time   History of Anesthesia Complications (+) PONV and history of anesthetic complications  Airway Mallampati: II  TM Distance: >3 FB Neck ROM: full    Dental no notable dental hx.    Pulmonary neg pulmonary ROS   Pulmonary exam normal breath sounds clear to auscultation       Cardiovascular Exercise Tolerance: Good hypertension, negative cardio ROS  Rhythm:regular Rate:Normal     Neuro/Psych  Headaches negative neurological ROS  negative psych ROS   GI/Hepatic negative GI ROS, Neg liver ROS,GERD  ,,  Endo/Other  negative endocrine ROSdiabetes    Renal/GU negative Renal ROS  negative genitourinary   Musculoskeletal   Abdominal   Peds  Hematology negative hematology ROS (+)   Anesthesia Other Findings   Reproductive/Obstetrics negative OB ROS                             Anesthesia Physical Anesthesia Plan  ASA: 2  Anesthesia Plan: General   Post-op Pain Management:    Induction:   PONV Risk Score and Plan: Propofol infusion  Airway Management Planned:   Additional Equipment:   Intra-op Plan:   Post-operative Plan:   Informed Consent: I have reviewed the patients History and Physical, chart, labs and discussed the procedure including the risks, benefits and alternatives for the proposed anesthesia with the patient or authorized representative who has indicated his/her understanding and acceptance.     Dental Advisory Given  Plan Discussed with: CRNA  Anesthesia Plan Comments:        Anesthesia Quick Evaluation

## 2023-05-01 NOTE — Anesthesia Postprocedure Evaluation (Signed)
 Anesthesia Post Note  Patient: Jeanette Pierce  Procedure(s) Performed: ESOPHAGOGASTRODUODENOSCOPY (EGD) WITH PROPOFOL COLONOSCOPY WITH PROPOFOL POLYPECTOMY  Patient location during evaluation: Short Stay Anesthesia Type: General Level of consciousness: awake and alert Pain management: pain level controlled Vital Signs Assessment: post-procedure vital signs reviewed and stable Respiratory status: spontaneous breathing Cardiovascular status: blood pressure returned to baseline and stable Postop Assessment: no apparent nausea or vomiting Anesthetic complications: no   No notable events documented.   Last Vitals:  Vitals:   05/01/23 1029 05/01/23 1220  BP: (!) 144/92   Pulse: 85 74  Resp: 20 19  Temp: 36.7 C 36.6 C  SpO2: 100% 97%    Last Pain:  Vitals:   05/01/23 1220  TempSrc: Oral  PainSc: 0-No pain                 Tamra Koos

## 2023-05-01 NOTE — Op Note (Signed)
 [facility] Patient Name: Jeanette Pierce Procedure Date: 05/01/2023 11:24 AM MRN: [id number] Date of Birth: 06-21-1965 Attending MD: Sanjuan Dame , MD, [id number] CSN: [id number] Age: 58 Admit Type: Outpatient Procedure:                Upper GI endoscopy Indications:              Dysphagia Providers:                Sanjuan Dame, MD, Angelica Ran, Lennice Sites                            Technician, Technician Referring MD:              Medicines:                Monitored Anesthesia Care Complications:            No immediate complications. Estimated Blood Loss:     Estimated blood loss was minimal. Procedure:                Pre-Anesthesia Assessment:                           - Prior to the procedure, a History and Physical                            was performed, and patient medications and                            allergies were reviewed. The patient's tolerance of                            previous anesthesia was also reviewed. The risks                            and benefits of the procedure and the sedation                            options and risks were discussed with the patient.                            All questions were answered, and informed consent                            was obtained. Prior Anticoagulants: The patient has                            taken no anticoagulant or antiplatelet agents. ASA                            Grade Assessment: II - A patient with mild systemic                            disease. After reviewing the risks and benefits,  the patient was deemed in satisfactory condition to                            undergo the procedure.                           After obtaining informed consent, the endoscope was                            passed under direct vision. Throughout the                            procedure, the patient's blood pressure, pulse, and                            oxygen saturations were  monitored continuously. The                            [id number]-H190 ([id number]) scope was introduced through the                            mouth, and advanced to the second part of duodenum.                            The upper GI endoscopy was accomplished without                            difficulty. The patient tolerated the procedure                            well. Scope In: 11:41:46 AM Scope Out: 11:49:13 AM Total Procedure Duration: 0 hours 7 minutes 27 seconds  Findings:      Mucosal changes including ringed esophagus, feline appearance and       longitudinal furrows were found in the entire esophagus. Esophageal       findings were graded using the Eosinophilic Esophagitis Endoscopic       Reference Score (EoE-EREFS) as: Edema Grade 1 Present (decreased clarity       or absence of vascular markings), Rings Grade 2 Moderate (distinct rings       that do not occlude passage of diagnostic 8-10 mm endoscope), Furrows       Grade 1 Mild (vertical lines without visible depth) and Stricture none       (no stricture found). Biopsies were obtained from the proximal and       distal esophagus with cold forceps for histology of suspected       eosinophilic esophagitis.      Mildly erythematous mucosa without bleeding was found in the gastric       antrum. Biopsies were taken with a cold forceps for histology.      The duodenal bulb and second portion of the duodenum were normal. Impression:               - Esophageal mucosal changes suggestive of                            eosinophilic  esophagitis.                           - Erythematous mucosa in the antrum. Biopsied.                           - Normal duodenal bulb and second portion of the                            duodenum.                           - Biopsies were taken with a cold forceps for                            evaluation of eosinophilic esophagitis. Moderate Sedation:      Per Anesthesia Care Recommendation:           -  Patient has a contact number available for                            emergencies. The signs and symptoms of potential                            delayed complications were discussed with the                            patient. Return to normal activities tomorrow.                            Written discharge instructions were provided to the                            patient.                           - Avoid Cow milk protein indefinitely.                           - Continue present medications.                           -Start Omeprazole 40mg  twice daily                           - Await pathology results.                           - Repeat upper endoscopy in 8 months to evaluate                            the response to therapy.                           - Return to GI clinic at appointment to be  scheduled. Procedure Code(s):        --- Professional ---                           [phone], Esophagogastroduodenoscopy, flexible,                            transoral; with biopsy, single or multiple Diagnosis Code(s):        --- Professional ---                           K22.89, Other specified disease of esophagus                           K31.89, Other diseases of stomach and duodenum                           R13.10, Dysphagia, unspecified CPT copyright 2022 American Medical Association. All rights reserved. The codes documented in this report are preliminary and upon coder review may  be revised to meet current compliance requirements. Sanjuan Dame, MD Sanjuan Dame, MD 05/01/2023 12:22:01 PM This report has been signed electronically. Number of Addenda: 0

## 2023-05-01 NOTE — Interval H&P Note (Signed)
 History and Physical Interval Note:  05/01/2023 11:20 AM  Jeanette Pierce  has presented today for surgery, with the diagnosis of DYSPHAGIA, SCREENING.  The various methods of treatment have been discussed with the patient and family. After consideration of risks, benefits and other options for treatment, the patient has consented to  Procedure(s) with comments: ESOPHAGOGASTRODUODENOSCOPY (EGD) WITH PROPOFOL (N/A) - 11:00AM;ASA 1-2 COLONOSCOPY WITH PROPOFOL (N/A) - 11:00AM;ASA 1-2 as a surgical intervention.  The patient's history has been reviewed, patient examined, no change in status, stable for surgery.  I have reviewed the patient's chart and labs.  Questions were answered to the patient's satisfaction.     [name]

## 2023-05-02 ENCOUNTER — Encounter (HOSPITAL_COMMUNITY): Payer: Self-pay | Admitting: Gastroenterology

## 2023-05-02 ENCOUNTER — Telehealth (INDEPENDENT_AMBULATORY_CARE_PROVIDER_SITE_OTHER): Payer: Self-pay | Admitting: *Deleted

## 2023-05-02 LAB — SURGICAL PATHOLOGY

## 2023-05-02 NOTE — Telephone Encounter (Signed)
 Per [facility] op note patient need - MR Abdomen and pelvis to stage small bowel disease                                                   -Ultraosund Abdomen

## 2023-05-05 NOTE — Progress Notes (Signed)
 I reviewed the pathology results. Ann, can you send her a letter with the findings as described below please? Repeat upper endoscopy in 8 weeks  Colonoscopy in 5 years   Thanks,  [name], MD Gastroenterology and Hepatology [facility]  ---------------------------------------------------------------------------------------------  [facility] [address], [address], [place], [place] [address] Phone:  [phone]   05/05/23 [place], [place]   Dear Jeanette Pierce,  I am writing to inform you that the biopsies taken during your recent endoscopic examination showed:  FINAL MICROSCOPIC DIAGNOSIS:  A. STOMACH, BIOPSY: Reactive gastropathy with mild chronic gastritis Negative for H. pylori, intestinal metaplasia, dysplasia carcinoma  B. DISTAL ESOPHAGUS, BIOPSY: Eosinophil-rich esophagitis (see comment) Negative for glandular epithelium, dysplasia and carcinoma  C. PROXIMAL ESOPHAGUS, BIOPSY: Eosinophil-rich esophagitis (see comment) Negative for glandular epithelium, dysplasia and carcinoma  D. RIGHT COLON, BIOPSY: Benign colonic mucosa with no diagnostic abnormality  E. LEFT COLON, BIOPSY: Benign colonic mucosa with no diagnostic abnormality  F. SIGMOID COLON, POLYPECTOMY: Hyperplastic polyp Negative for dysplasia and carcinoma  COMMENT:  The esophageal biopsies both show an eosinophil rich esophagitis with greater than 40 eosinophils within a high-power field.  The differential diagnosis would include true eosinophilic esophagitis (favored) or severe reflux esophagitis.  Clinical and endoscopic correlation is recommended.    What does this mean?  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 This build-up can lead to symptoms such as difficulty swallowing, food getting stuck, and chest pain. EOE is often related to food allergies or other environmental factors. Treatment typically involves dietary changes, such as eliminating specific allergens, and medications like proton pump inhibitors or topical steroids to reduce inflammation and manage symptoms. We will work closely with you to develop a personalized treatment plan to help you manage this condition effectively.  Continue taking omeprazole twice daily as prescribed and I look forward to seeing you in the clinic  Also good news there is no evidence of Crohn's disease in the colon   Also I value your feedback , so if you get a survey , please take the time to fill it out and thank you for choosing [facility]  Please call us at [phone] if you have persistent problems or have questions about your condition that have not been fully answered at this time.  Sincerely,  [name], MD Gastroenterology and Hepatology

## 2023-05-06 NOTE — Telephone Encounter (Signed)
 PA approved via [employer]. Order number [id number] valid from 05/06/23-06/04/23. Pt scheduled for ultrasound 3/24 at 10:30am. Npo after midnight. Mri scheduled for 05/26/23 at 11:30am;pt to arrive at 9:45am. NPO 4 hours prior. Pt contacted and made aware

## 2023-05-08 ENCOUNTER — Encounter (INDEPENDENT_AMBULATORY_CARE_PROVIDER_SITE_OTHER): Payer: Self-pay | Admitting: *Deleted

## 2023-05-17 ENCOUNTER — Other Ambulatory Visit: Payer: Self-pay | Admitting: Family Medicine

## 2023-05-19 ENCOUNTER — Ambulatory Visit (HOSPITAL_COMMUNITY)
Admission: RE | Admit: 2023-05-19 | Discharge: 2023-05-19 | Disposition: A | Discharge status: Home / Self Care | Source: Ambulatory Visit | Attending: Gastroenterology | Admitting: Gastroenterology

## 2023-05-19 DIAGNOSIS — R748 Abnormal levels of other serum enzymes: Secondary | ICD-10-CM | POA: Diagnosis not present

## 2023-05-19 DIAGNOSIS — K50012 Crohn's disease of small intestine with intestinal obstruction: Secondary | ICD-10-CM | POA: Diagnosis not present

## 2023-05-19 DIAGNOSIS — K760 Fatty (change of) liver, not elsewhere classified: Secondary | ICD-10-CM | POA: Insufficient documentation

## 2023-05-19 DIAGNOSIS — K838 Other specified diseases of biliary tract: Secondary | ICD-10-CM | POA: Diagnosis not present

## 2023-05-19 DIAGNOSIS — N281 Cyst of kidney, acquired: Secondary | ICD-10-CM | POA: Diagnosis not present

## 2023-05-21 ENCOUNTER — Ambulatory Visit (INDEPENDENT_AMBULATORY_CARE_PROVIDER_SITE_OTHER): Payer: BC Managed Care – PPO | Admitting: Gastroenterology

## 2023-05-21 VITALS — BP 118/79 | HR 85 | Temp 98.0°F | Ht 62.0 in | Wt 213.9 lb

## 2023-05-21 DIAGNOSIS — K200 Eosinophilic esophagitis: Secondary | ICD-10-CM

## 2023-05-21 DIAGNOSIS — K760 Fatty (change of) liver, not elsewhere classified: Secondary | ICD-10-CM | POA: Diagnosis not present

## 2023-05-21 DIAGNOSIS — K50012 Crohn's disease of small intestine with intestinal obstruction: Secondary | ICD-10-CM | POA: Insufficient documentation

## 2023-05-21 DIAGNOSIS — R748 Abnormal levels of other serum enzymes: Secondary | ICD-10-CM

## 2023-05-21 DIAGNOSIS — Z6841 Body Mass Index (BMI) 40.0 and over, adult: Secondary | ICD-10-CM

## 2023-05-21 DIAGNOSIS — R7989 Other specified abnormal findings of blood chemistry: Secondary | ICD-10-CM

## 2023-05-21 DIAGNOSIS — Z8719 Personal history of other diseases of the digestive system: Secondary | ICD-10-CM

## 2023-05-21 NOTE — Progress Notes (Signed)
 Jeanette Pierce , M.D. Gastroenterology & Hepatology [facility] [address] [place], [place] [address] Primary Care Physician: Jeanette Pierce, Jeanette Pierce [address] Jeanette Pierce [place] [address]  Chief Complaint: Dysphagia with new diagnosis of EOE ,elevated liver enzymes, history of Crohn's disease  History of Present Illness:  Jeanette Pierce is a 58 y.o. female with history of ileo-colonic Crohns disease s/p ileo-cecal  resection 1999 , currently not on any maintenance medications  who presents for follow-up of Dysphagia with new diagnosis of EOE ,elevated liver enzymes, history of Crohn's disease  Patient reports solid food dysphagia as if food getting stuck in middle of the chest with occasional regurgitation, since starting omeprazole twice daily symptoms are rarely happening now that she feels a little nauseous  Patient reports that she has 2-3 bowel movements daily alternating from solid to liquid but has been like this for many years.  Patient denies any blood per rectum but has occasional left upper quadrant abdominal discomfort and does feel like a "knot".  Patient is unsure on what type of medical treatment she received records disease but was only on pills and never got any injections.  Since 2000 she has never been on any maintenance therapy or followed up with any GI.The patient denies having any ,vomiting, fever, chills, hematochezia, melena, hematemesis, abdominal distention, , jaundice, pruritus or weight loss.  Last EGD:04/2023  - Esophageal mucosal changes suggestive of eosinophilic esophagitis. - Erythematous mucosa in the antrum. Biopsied. - Normal duodenal bulb and second portion of the duodenum. - Biopsies were taken with a cold forceps for evaluation of eosinophilic esophagitis.  Last Colonoscopy:04/2023 - Hemorrhoids found on perianal exam. - One 5 mm polyp in the sigmoid colon, removed with a cold snare. Resected and retrieved.  - Patent side- to- side ileo- colonic anastomosis, characterized by healthy appearing mucosa. - Non- bleeding external and internal hemorrhoids. - The examined portion of the ileum was normal. - Biopsies were obtained in the rectum, in the sigmoid colon, in the descending colon, in the transverse colon and in the ascending colon.  A. STOMACH, BIOPSY: Reactive gastropathy with mild chronic gastritis Negative for H. pylori, intestinal metaplasia, dysplasia carcinoma   B. DISTAL ESOPHAGUS, BIOPSY: Eosinophil-rich esophagitis (see comment) Negative for glandular epithelium, dysplasia and carcinoma   C. PROXIMAL ESOPHAGUS, BIOPSY: Eosinophil-rich esophagitis (see comment) Negative for glandular epithelium, dysplasia and carcinoma   D. RIGHT COLON, BIOPSY: Benign colonic mucosa with no diagnostic abnormality   E. LEFT COLON, BIOPSY: Benign colonic mucosa with no diagnostic abnormality   F. SIGMOID COLON, POLYPECTOMY: Hyperplastic polyp Negative for dysplasia and carcinoma   COMMENT:   The esophageal biopsies both show an eosinophil rich esophagitis with greater than 40 eosinophils within a high-power field.  The differential diagnosis would include true eosinophilic esophagitis (favored) or severe reflux esophagitis.  Clinical and endoscopic correlation is recommended.    FHx: neg for any gastrointestinal/liver disease, no malignancies Social: neg smoking, alcohol or illicit drug use Surgical: Ex lap with ileocecal resection, cholecystectomy  Past Medical History: Past Medical History:  Diagnosis Date   Diabetes mellitus without complication (HCC)    Fatty liver    GERD (gastroesophageal reflux disease)    Hypertension    Migraine headache    PONV (postoperative nausea and vomiting)    Reflux     Past Surgical History: Past Surgical History:  Procedure Laterality Date   CHOLECYSTECTOMY  before 1999   COLONOSCOPY WITH PROPOFOL N/A  05/01/2023   Procedure: COLONOSCOPY WITH  PROPOFOL;  Surgeon: Franky Macho, MD;  [facility];  Service: Endoscopy;  Laterality: N/A;  11:00AM;ASA 1-2   ESOPHAGOGASTRODUODENOSCOPY (EGD) WITH PROPOFOL N/A 05/01/2023   Procedure: ESOPHAGOGASTRODUODENOSCOPY (EGD) WITH PROPOFOL;  Surgeon: Franky Macho, MD;  [facility];  Service: Endoscopy;  Laterality: N/A;  11:00AM;ASA 1-2   iliocolotomy  1999   POLYPECTOMY  05/01/2023   Procedure: POLYPECTOMY;  Surgeon: Franky Macho, MD;  [facility];  Service: Endoscopy;;   TUBAL LIGATION  1991    Family History: Family History  Problem Relation Age of Onset   Heart disease Mother    COPD Father    Asthma Father    Cancer Maternal Grandfather        colon and stomach    Social History: Social History   Tobacco Use  Smoking Status Never  Smokeless Tobacco Never   Social History   Substance and Sexual Activity  Alcohol Use Not Currently   Social History   Substance and Sexual Activity  Drug Use Not Currently    Allergies: Allergies  Allergen Reactions   Erythromycin Nausea And Vomiting    Nausea, vomiting   Lisinopril Nausea And Vomiting    Medications: Current Outpatient Medications  Medication Sig Dispense Refill   amLODipine (NORVASC) 5 MG tablet Take 1 tablet by mouth once daily 90 tablet 0   atenolol (TENORMIN) 100 MG tablet Take 1 tablet by mouth once daily 90 tablet 0   atorvastatin (LIPITOR) 10 MG tablet Take 1 tablet (10 mg total) by mouth daily. 90 tablet 3   glipiZIDE (GLUCOTROL) 5 MG tablet Take 1 tablet (5 mg total) by mouth daily before breakfast. 90 tablet 3   imipramine (TOFRANIL) 25 MG tablet TAKE 4 TABLETS BY MOUTH AT BEDTIME 360 tablet 1   Insulin Glargine (BASAGLAR KWIKPEN) 100 UNIT/ML Inject 14 Units into the skin daily. 15 mL 3   Insulin Pen Needle 32G X 4 MM MISC 1 Device by Does not apply route daily in the afternoon. 100 each 3   metFORMIN (GLUCOPHAGE-XR) 500 MG 24 hr tablet Take 2 tablets (1,000 mg  total) by mouth 2 (two) times daily with a meal. 360 tablet 3   omeprazole (PRILOSEC) 40 MG capsule Take 1 capsule (40 mg total) by mouth in the morning and at bedtime. 180 capsule 0   rizatriptan (MAXALT) 10 MG tablet Take 1 tablet (10 mg total) by mouth daily as needed (for migraine pain). 10 tablet 5   Semaglutide (OZEMPIC, 2 MG/DOSE, [place]) Inject into the skin. 2 mg Once a week     No current facility-administered medications for this visit.    Review of Systems: GENERAL: negative for malaise, night sweats HEENT: No changes in hearing or vision, no nose bleeds or other nasal problems. NECK: Negative for lumps, goiter, pain and significant neck swelling RESPIRATORY: Negative for cough, wheezing CARDIOVASCULAR: Negative for chest pain, leg swelling, palpitations, orthopnea GI: SEE HPI MUSCULOSKELETAL: Negative for joint pain or swelling, back pain, and muscle pain. SKIN: Negative for lesions, rash HEMATOLOGY Negative for prolonged bleeding, bruising easily, and swollen nodes. ENDOCRINE: Negative for cold or heat intolerance, polyuria, polydipsia and goiter. NEURO: negative for tremor, gait imbalance, syncope and seizures. The remainder of the review of systems is noncontributory.   Physical Exam: BP 118/79   Pulse 85   Temp 98 F (36.7 C) (Oral)   Ht 5\' 2"  (1.575 m)   Wt  213 lb 14.4 oz (97 kg)   BMI 39.12 kg/m  GENERAL: The patient is AO x3, in no acute distress. HEENT: Head is normocephalic and atraumatic. EOMI are intact. Mouth is well hydrated and without lesions. NECK: Supple. No masses LUNGS: Clear to auscultation. No presence of rhonchi/wheezing/rales. Adequate chest expansion HEART: RRR, normal s1 and s2. ABDOMEN: Soft, nontender, no guarding, no peritoneal signs, and nondistended. BS +. No masses.  Imaging/Labs: as above     Latest Ref Rng & Units 10/24/2019    7:58 AM 10/23/2019    3:10 AM 09/30/2018    2:36 PM  CBC  WBC 4.0 - 10.5 K/uL 8.5  4.6  11.1    Hemoglobin 12.0 - 15.0 g/dL 82.9  56.2  13.0   Hematocrit 36.0 - 46.0 % 43.0  45.6  55.6   Platelets 150 - 400 K/uL 305  193  383    Lab Results  Component Value Date   IRON 60 04/11/2023   TIBC 418 04/11/2023   FERRITIN 33 04/11/2023    I personally reviewed and interpreted the available labs, imaging and endoscopic files.  Ultrasound  IMPRESSION: 1. Prior cholecystectomy. 2. Increased echotexture of the liver. This is a nonspecific finding but can be seen in fatty infiltration of liver.   Ferritin 33% saturation 14 (iron deficient)  ALT 38   Hep a negative Hep B surface antibody and core antibody negative.  Nonimmune Hepatitis C negative   HIV negative Negative AMA, ASMA INR 1  Impression and Plan:  Jeanette Pierce is a 58 y.o. female with history of ileo-colonic Crohns disease s/p ileo-cecal  resection 1999 , currently not on any maintenance medications  who presents for evaluation of Dysphagia, GERD, elevated liver enzymes, history of Crohn's disease and screening colonoscopy.  #Dysphagia , new diagnosis of EOE  EoE- EREFS score : 4   EGD 04/2023 Biopsies with proximal distal esophagus with greater than 40% close within a high-power field   - Avoid Cow milk protein indefinitely. -Omeprazole 40mg  twice daily .We will perform upper endoscopy after 8 to 12 weeks of therapy to assess endoscopic and histologic response.  The endpoints of therapy of EoE include improvements in clinical symptoms and esophageal eosinophilic inflammation. While complete resolution of symptoms and pathology is an ideal endpoint, acceptance of a range of reductions in symptoms and histology is a more realistic and practical goal in clinical practice. (Recommendation conditional, evidence low)  Patient was extensively  counseled about the high likelihood of symptom recurrence after discontinuing treatment due to the chronic nature of this disease. (Recommendation strong, moderate evidence)   The  overall goal of maintenance therapy is to minimize symptoms and prevent complications of EoE, preserve quality of life, with minimal long-term adverse effects of treatments. (Recommendation conditional, evidence low)   #History of small bowel Crohn's disease   Patient has history of Crohn's disease status post ileocecal resection in 1999 Having been on any maintenance therapy since then and never followed up with any gastroenterologist.  Posey Rea on the medical management she got her Crohn's at that time but was only pills and not injectables  ileocolonoscopy with random colonic biopsy negative for any evidence of active disease in colon and terminal ileum (04/2023)   Given history of small bowel Crohn's, will stage and assess for disease activity small bowel with MR enterography abdomen and pelvis  #Elevated liver enzymes #MASLD  Patient has elevated liver enzymes likely MASLD  due to BMI 40  Hepatitis A  and B nonimmune nonexposed.  Hep C negative.  Autoimmune serologies negative Ultrasound with fatty liver disease      - walking at a brisk pace/biking at moderate intesity 2.5-5 hours per week     - use pedometer/step counter to track activity     - goal to lose 5-10% of initial body weight     - avoid suagry drinks and juices, use zero calorie beverages     - increase water intake     - eat a low carb diet with plenty of veggies and fruit     - Get sufficient sleep 7-8 hrs nightly     - maitain active lifestyle     - avoid alcohol     - recommend 2-3 cups Coffee daily     - Counsel on lowering cholesterol by having a diet rich in vegetables,          protein (avoid red meats) and good fats(fish, salmon).  In future if liver enzymes continues to be elevated may plan for Rezdiffra for MASLD . Currently patient on ozempic for weight loss  All questions were answered.      Jeanette Lawman, MD Gastroenterology and Hepatology [facility]   This  chart has been completed using [facility] Dictation software, and while attempts have been made to ensure accuracy , certain words and phrases may not be transcribed as intended

## 2023-05-21 NOTE — Patient Instructions (Addendum)
 It was very nice to meet you today, as dicussed with will plan for the following :  1) upper endoscopy in 8 weeks  2) omeprazole 40mg  twice daily   3)   - walking at a brisk pace/biking at moderate intesity 2.5-5 hours per week     - use pedometer/step counter to track activity     - goal to lose 5-10% of initial body weight     - avoid suagry drinks and juices, use zero calorie beverages     - increase water intake     - eat a low carb diet with plenty of veggies and fruit     - Get sufficient sleep 7-8 hrs nightly     - maitain active lifestyle     - avoid alcohol     - recommend 2-3 cups Coffee daily     - Counsel on lowering cholesterol by having a diet rich in vegetables,          protein (avoid red meats) and good fats(fish, salmon).  4)Avoid Cow milk protein

## 2023-05-26 ENCOUNTER — Ambulatory Visit (HOSPITAL_COMMUNITY)
Admission: RE | Admit: 2023-05-26 | Discharge: 2023-05-26 | Disposition: A | Discharge status: Home / Self Care | Source: Ambulatory Visit | Attending: Gastroenterology | Admitting: Gastroenterology

## 2023-05-26 ENCOUNTER — Ambulatory Visit (HOSPITAL_COMMUNITY)
Admission: RE | Admit: 2023-05-26 | Discharge: 2023-05-26 | Disposition: A | Discharge status: Home / Self Care | Source: Ambulatory Visit | Attending: Gastroenterology

## 2023-05-26 DIAGNOSIS — Z9049 Acquired absence of other specified parts of digestive tract: Secondary | ICD-10-CM | POA: Diagnosis not present

## 2023-05-26 DIAGNOSIS — D259 Leiomyoma of uterus, unspecified: Secondary | ICD-10-CM | POA: Diagnosis not present

## 2023-05-26 DIAGNOSIS — K50012 Crohn's disease of small intestine with intestinal obstruction: Secondary | ICD-10-CM

## 2023-05-26 DIAGNOSIS — R9389 Abnormal findings on diagnostic imaging of other specified body structures: Secondary | ICD-10-CM | POA: Diagnosis not present

## 2023-05-26 DIAGNOSIS — K5090 Crohn's disease, unspecified, without complications: Secondary | ICD-10-CM | POA: Diagnosis not present

## 2023-05-26 MED ORDER — GADOBUTROL 1 MMOL/ML IV SOLN
10.0000 mL | Freq: Once | INTRAVENOUS | Status: AC | PRN
  Administered 2023-05-26: 10 mL via INTRAVENOUS

## 2023-05-30 ENCOUNTER — Encounter (INDEPENDENT_AMBULATORY_CARE_PROVIDER_SITE_OTHER): Payer: Self-pay | Admitting: Gastroenterology

## 2023-05-30 NOTE — Progress Notes (Signed)
 MRE Abdomen : IMPRESSION: 1. Probable terminal ileocolectomy with ileocolic anastomosis. Within exam limitations, no evidence of bowel wall thickening, distention, or inflammatory changes. No evidence of inflammatory bowel disease complication such as stricture, obstruction, fistula, or abscess. 2. Thickened, complex appearance of the endometrium, better assessed by recent prior pelvic ultrasound, concerning for endometrial malignancy or hyperplasia in the postmenopausal setting. 3. Uterine fibroids. 4. Status post cholecystectomy.  No disease activity in the small bowel suggesting any active Crohn's disease.  Although incidental finding of complex endometrial thickening

## 2023-06-05 ENCOUNTER — Encounter (INDEPENDENT_AMBULATORY_CARE_PROVIDER_SITE_OTHER): Payer: Self-pay | Admitting: *Deleted

## 2023-06-21 ENCOUNTER — Other Ambulatory Visit: Payer: Self-pay | Admitting: Family Medicine

## 2023-06-23 ENCOUNTER — Other Ambulatory Visit: Payer: Self-pay

## 2023-06-23 MED ORDER — OZEMPIC (2 MG/DOSE) 8 MG/3ML ~~LOC~~ SOPN: 2.0000 mg | PEN_INJECTOR | SUBCUTANEOUS | 3 refills | Status: DC

## 2023-06-30 ENCOUNTER — Telehealth (INDEPENDENT_AMBULATORY_CARE_PROVIDER_SITE_OTHER): Payer: Self-pay | Admitting: Gastroenterology

## 2023-06-30 NOTE — Telephone Encounter (Signed)
 Who is your primary care physician: Dr.Jayce Ruse  Are you diabetic? If yes, Type 1 or Type 2?    Yes Type 2  Do you have a prosthetic or mechanical heart valve? no  Do you have a pacemaker/defibrillator?   no  Have you had endocarditis/atrial fibrillation? no  Have you had joint replacement within the last 12 months?  no  Do you tend to be constipated or have to use laxatives? Yes occasional   Do you have any history of drugs or alchohol?  no  Do you use supplemental oxygen?  no  Have you had a stroke or heart attack within the last 6 months? no  Do you take weight loss medication? Yes (Ozempic  for DM)  For female patients: have you had a hysterectomy?  no                                     are you post menopausal?       yes                                            do you still have your menstrual cycle? no      Do you take any blood-thinning medications such as: (aspirin, warfarin, Plavix, Aggrenox)  no  If yes we need the name, milligram, dosage and who is prescribing doctor  Current Outpatient Medications on File Prior to Visit  Medication Sig Dispense Refill   amLODipine  (NORVASC ) 5 MG tablet Take 1 tablet by mouth once daily 90 tablet 0   atenolol  (TENORMIN ) 100 MG tablet Take 1 tablet by mouth once daily 90 tablet 0   atorvastatin  (LIPITOR) 10 MG tablet Take 1 tablet (10 mg total) by mouth daily. 90 tablet 3   glipiZIDE  (GLUCOTROL ) 5 MG tablet Take 1 tablet (5 mg total) by mouth daily before breakfast. 90 tablet 3   imipramine  (TOFRANIL ) 25 MG tablet TAKE 4 TABLETS BY MOUTH AT BEDTIME 360 tablet 1   Insulin  Glargine (BASAGLAR  KWIKPEN) 100 UNIT/ML Inject 14 Units into the skin daily. 15 mL 3   Insulin  Pen Needle 32G X 4 MM MISC 1 Device by Does not apply route daily in the afternoon. 100 each 3   metFORMIN  (GLUCOPHAGE -XR) 500 MG 24 hr tablet Take 2 tablets (1,000 mg total) by mouth 2 (two) times daily with a meal. 360 tablet 3   omeprazole  (PRILOSEC) 40 MG capsule Take  1 capsule (40 mg total) by mouth in the morning and at bedtime. 180 capsule 0   rizatriptan  (MAXALT ) 10 MG tablet Take 1 tablet (10 mg total) by mouth daily as needed (for migraine pain). 10 tablet 5   Semaglutide  (OZEMPIC , 2 MG/DOSE, [place]) Inject into the skin. 2 mg Once a week     Semaglutide , 2 MG/DOSE, (OZEMPIC , 2 MG/DOSE,) 8 MG/3ML SOPN Inject 2 mg into the skin once a week. 3 mL 3   No current facility-administered medications on file prior to visit.    Allergies  Allergen Reactions   Erythromycin Nausea And Vomiting    Nausea, vomiting   Lisinopril  Nausea And Vomiting     Pharmacy:   Primary Insurance Name:   Best number where you can be reached: [phone]

## 2023-07-02 ENCOUNTER — Ambulatory Visit: Admitting: Obstetrics & Gynecology

## 2023-07-02 ENCOUNTER — Other Ambulatory Visit (HOSPITAL_COMMUNITY)
Admission: RE | Admit: 2023-07-02 | Discharge: 2023-07-02 | Disposition: A | Discharge status: Home / Self Care | Source: Ambulatory Visit | Attending: Obstetrics & Gynecology | Admitting: Obstetrics & Gynecology

## 2023-07-02 ENCOUNTER — Encounter: Payer: Self-pay | Admitting: Obstetrics & Gynecology

## 2023-07-02 VITALS — BP 133/84 | HR 86 | Ht 62.0 in | Wt 212.5 lb

## 2023-07-02 DIAGNOSIS — R9389 Abnormal findings on diagnostic imaging of other specified body structures: Secondary | ICD-10-CM

## 2023-07-02 DIAGNOSIS — N950 Postmenopausal bleeding: Secondary | ICD-10-CM | POA: Insufficient documentation

## 2023-07-02 NOTE — Progress Notes (Signed)
 Endometrial Biopsy Procedure Note  Pre-operative Diagnosis: Post menopausal bleeding with thickened endometrium on sonogram   Post-operative Diagnosis: same + cervical stenosis s/p LEEP at age 58  Indications: postmenopausal bleeding  Procedure Details   Urine pregnancy test was not done.  The risks (including infection, bleeding, pain, and uterine perforation) and benefits of the procedure were explained to the patient and Written informed consent was obtained.  Antibiotic prophylaxis against endocarditis was not indicated.   The patient was placed in the dorsal lithotomy position.  Bimanual exam showed the uterus to be in the neutral position.  A Graves' speculum inserted in the vagina, and the cervix prepped with povidone iodine.  Endocervical curettage with a Kevorkian curette was not performed.   A sharp tenaculum was applied to the anterior lip of the cervix for stabilization.  A sterile uterine sound was used to sound the uterus to a depth of 6.5 cm.  A Pipelle endometrial aspirator was used to sample the endometrium.  Sample was sent for pathologic examination.  Condition: Stable  Complications: None  Plan:  The patient was advised to call for any fever or for prolonged or severe pain or bleeding. She was advised to use OTC analgesics as needed for mild to moderate pain. She was advised to avoid vaginal intercourse for 48 hours or until the bleeding has completely stopped.  Attending Physician Documentation: I was present for or performed the following: endometrial biopsy     Narrative & Impression  : PROCEDURE: US  PELVIS COMPLETE   HISTORY: Patient is a 58 y/o F with post menopausal bleeding. G2P2.   COMPARISON: CT pelvis 12/13/1998.   TECHNIQUE: Two-dimensional transabdominal grayscale and color Doppler ultrasound of the pelvis was performed. Transvaginal was not performed.   FINDINGS: The uterus is anteverted in position and measures 9.8 x 5.5 x 4.3 cm. It  demonstrates a normal, homogeneous echotexture. The endometrium measures 1.2 cm and demonstrates a normal homogeneous echotexture.   The ovaries are not visualized.   There is no fluid present within the cul-de-sac.   IMPRESSION: 1. Thickened postmenopausal endometrium measuring 1.2 cm. Gynecological consultation and tissue sampling may be recommended for further evaluation as clinically indicated.   2.  Nonvisualization of the ovaries.   Thank you for allowing us  to assist in the care of this patient.     Electronically Signed   By: Beula Brunswick M.D.   On: 04/08/2023 16:46       ICD-10-CM   1. PMB (postmenopausal bleeding):  N95.0    Menopause 2019. Off/on light spotting x 5 years.  Recently more significant. ES 12 mm    2. Thickened endometrium: 12 mm homogenous, see sonogram report above  R93.89      Past Medical History:  Diagnosis Date   Diabetes mellitus without complication (HCC)    Fatty liver    GERD (gastroesophageal reflux disease)    Hypertension    Migraine headache    PONV (postoperative nausea and vomiting)    Reflux     Past Surgical History:  Procedure Laterality Date   CHOLECYSTECTOMY  before 1999   COLONOSCOPY WITH PROPOFOL  N/A 05/01/2023   Procedure: COLONOSCOPY WITH PROPOFOL ;  Surgeon: Hargis Lias, MD;  [facility];  Service: Endoscopy;  Laterality: N/A;  11:00AM;ASA 1-2   ESOPHAGOGASTRODUODENOSCOPY (EGD) WITH PROPOFOL  N/A 05/01/2023   Procedure: ESOPHAGOGASTRODUODENOSCOPY (EGD) WITH PROPOFOL ;  Surgeon: Hargis Lias, MD;  [facility]: [facility];  Service: Endoscopy;  Laterality: N/A;  11:00AM;ASA 1-2  iliocolotomy  02/25/1997   POLYPECTOMY  05/01/2023   Procedure: POLYPECTOMY;  Surgeon: Hargis Lias, MD;  Location: [facility];  Service: Endoscopy;;   TUBAL LIGATION  02/25/1989    OB History     Gravida  2   Para  2   Term  2   Preterm      AB      Living  2      SAB      IAB      Ectopic       Multiple      Live Births  2           Allergies  Allergen Reactions   Erythromycin Nausea And Vomiting    Nausea, vomiting   Lisinopril  Nausea And Vomiting    Social History   Socioeconomic History   Marital status: Married    Spouse name: Not on file   Number of children: Not on file   Years of education: Not on file   Highest education level: 12th grade  Occupational History   Not on file  Tobacco Use   Smoking status: Never   Smokeless tobacco: Never  Vaping Use   Vaping status: Never Used  Substance and Sexual Activity   Alcohol use: Not Currently   Drug use: Not Currently   Sexual activity: Not Currently    Birth control/protection: Post-menopausal    Comment: Married, same female partner but not sexually active  Other Topics Concern   Not on file  Social History Narrative   Not on file   Social Drivers of Health   Financial Resource Strain: Medium Risk (07/02/2023)   Overall Financial Resource Strain (CARDIA)    Difficulty of Paying Living Expenses: Somewhat hard  Food Insecurity: Food Insecurity Present (07/02/2023)   Hunger Vital Sign    Worried About Running Out of Food in the Last Year: Never true    Ran Out of Food in the Last Year: Sometimes true  Transportation Needs: No Transportation Needs (07/02/2023)   PRAPARE - Administrator, Civil Service (Medical): No    Lack of Transportation (Non-Medical): No  Physical Activity: Insufficiently Active (07/02/2023)   Exercise Vital Sign    Days of Exercise per Week: 2 days    Minutes of Exercise per Session: 60 min  Stress: Stress Concern Present (07/02/2023)   [employer] of Occupational Health - Occupational Stress Questionnaire    Feeling of Stress : Very much  Social Connections: Moderately Isolated (07/02/2023)   Social Connection and Isolation Panel [NHANES]    Frequency of Communication with Friends and Family: More than three times a week    Frequency of Social Gatherings with  Friends and Family: Once a week    Attends Religious Services: Never    Database administrator or Organizations: No    Attends Engineer, structural: Never    Marital Status: Married    Family History  Problem Relation Age of Onset   Cancer Maternal Grandfather        colon and stomach   COPD Father    Asthma Father    Heart disease Mother    Kidney disease Mother

## 2023-07-02 NOTE — Addendum Note (Signed)
 Addended by: Devon Kingdon E on: 07/02/2023 10:35 AM   Modules accepted: Orders

## 2023-07-04 ENCOUNTER — Telehealth: Payer: Self-pay | Admitting: *Deleted

## 2023-07-04 LAB — SURGICAL PATHOLOGY

## 2023-07-04 NOTE — Telephone Encounter (Signed)
 Spoke with pt to schedule EGD with Dr. Alita Irwin, ASA 1/2, 10 WEEKS AFTER LAST OV. SCHEDULED FOR 6/26. Aware she needs to hold ozempic  x 7 days prior. Will send instructions via mychart. Also aware will get a pre-op phone call confirming time about 2-3 days prior.   Checked carlon and no PA required

## 2023-07-08 NOTE — Telephone Encounter (Signed)
 Ok to schedule. Any room  Although I see patient is already scheduled for 6/26 . Please have patient contine omperazole twice daily for the procedure   Thanks,  Chrisandra Wiemers Faizan Ellisha Bankson, MD Gastroenterology and Hepatology [facility]

## 2023-07-08 NOTE — Telephone Encounter (Signed)
 Noted. Left detailed message on pt voicemail

## 2023-07-14 ENCOUNTER — Ambulatory Visit: Admitting: Obstetrics & Gynecology

## 2023-07-14 VITALS — BP 132/84 | HR 82 | Ht 62.0 in | Wt 212.0 lb

## 2023-07-14 DIAGNOSIS — R9389 Abnormal findings on diagnostic imaging of other specified body structures: Secondary | ICD-10-CM | POA: Diagnosis not present

## 2023-07-14 DIAGNOSIS — N950 Postmenopausal bleeding: Secondary | ICD-10-CM | POA: Diagnosis not present

## 2023-07-14 MED ORDER — PROGESTERONE 200 MG PO CAPS: 200.0000 mg | ORAL_CAPSULE | Freq: Every day | ORAL | 3 refills | Status: AC

## 2023-07-14 NOTE — Progress Notes (Signed)
 Follow up appointment for results: EMBx  Chief Complaint  Patient presents with   Results    Blood pressure 132/84, pulse 82, height 5\' 2"  (1.575 m), weight 212 lb (96.2 kg).  EMBx: Benign inactive endometrium No atypia hyperplasia or malignancy  MEDS ordered this encounter: Meds ordered this encounter  Medications   progesterone  (PROMETRIUM ) 200 MG capsule    Sig: Take 1 capsule (200 mg total) by mouth daily.    Dispense:  90 capsule    Refill:  3    Orders for this encounter: No orders of the defined types were placed in this encounter.   Impression + Management Plan   ICD-10-CM   1. PMB (postmenopausal bleeding):: continuous prometrium  200 mg qhs  N95.0    follow up 3 months    2. Thickened endometrium: 12 mm homogenous, see sonogram report above  R93.89       Follow Up: Return in about 3 months (around 10/14/2023) for Follow up, with Dr Randolm Butte.     All questions were answered.  Past Medical History:  Diagnosis Date   Diabetes mellitus without complication (HCC)    Fatty liver    GERD (gastroesophageal reflux disease)    Hypertension    Migraine headache    PONV (postoperative nausea and vomiting)    Reflux     Past Surgical History:  Procedure Laterality Date   CHOLECYSTECTOMY  before 1999   COLONOSCOPY WITH PROPOFOL  N/A 05/01/2023   Procedure: COLONOSCOPY WITH PROPOFOL ;  Surgeon: Hargis Lias, MD;  [facility];  Service: Endoscopy;  Laterality: N/A;  11:00AM;ASA 1-2   ESOPHAGOGASTRODUODENOSCOPY (EGD) WITH PROPOFOL  N/A 05/01/2023   Procedure: ESOPHAGOGASTRODUODENOSCOPY (EGD) WITH PROPOFOL ;  Surgeon: Hargis Lias, MD;  [facility];  Service: Endoscopy;  Laterality: N/A;  11:00AM;ASA 1-2   iliocolotomy  02/25/1997   POLYPECTOMY  05/01/2023   Procedure: POLYPECTOMY;  Surgeon: Hargis Lias, MD;  [facility]: [facility];  Service: Endoscopy;;   TUBAL LIGATION  02/25/1989    OB History     Gravida  2   Para  2    Term  2   Preterm      AB      Living  2      SAB      IAB      Ectopic      Multiple      Live Births  2           Allergies  Allergen Reactions   Erythromycin Nausea And Vomiting    Nausea, vomiting   Lisinopril  Nausea And Vomiting    Social History   Socioeconomic History   Marital status: Married    Spouse name: Not on file   Number of children: Not on file   Years of education: Not on file   Highest education level: 12th grade  Occupational History   Not on file  Tobacco Use   Smoking status: Never   Smokeless tobacco: Never  Vaping Use   Vaping status: Never Used  Substance and Sexual Activity   Alcohol use: Not Currently   Drug use: Not Currently   Sexual activity: Not Currently    Birth control/protection: Post-menopausal    Comment: Married, same female partner but not sexually active  Other Topics Concern   Not on file  Social History Narrative   Not on file   Social Drivers of Health   Financial Resource Strain: Medium Risk (07/02/2023)   Overall  Financial Resource Strain (CARDIA)    Difficulty of Paying Living Expenses: Somewhat hard  Food Insecurity: Food Insecurity Present (07/02/2023)   Hunger Vital Sign    Worried About Running Out of Food in the Last Year: Never true    Ran Out of Food in the Last Year: Sometimes true  Transportation Needs: No Transportation Needs (07/02/2023)   PRAPARE - Administrator, Civil Service (Medical): No    Lack of Transportation (Non-Medical): No  Physical Activity: Insufficiently Active (07/02/2023)   Exercise Vital Sign    Days of Exercise per Week: 2 days    Minutes of Exercise per Session: 60 min  Stress: Stress Concern Present (07/02/2023)   [employer] of Occupational Health - Occupational Stress Questionnaire    Feeling of Stress : Very much  Social Connections: Moderately Isolated (07/02/2023)   Social Connection and Isolation Panel [NHANES]    Frequency of Communication with  Friends and Family: More than three times a week    Frequency of Social Gatherings with Friends and Family: Once a week    Attends Religious Services: Never    Database administrator or Organizations: No    Attends Engineer, structural: Never    Marital Status: Married    Family History  Problem Relation Age of Onset   Cancer Maternal Grandfather        colon and stomach   COPD Father    Asthma Father    Heart disease Mother    Kidney disease Mother

## 2023-07-29 ENCOUNTER — Other Ambulatory Visit (INDEPENDENT_AMBULATORY_CARE_PROVIDER_SITE_OTHER): Payer: Self-pay | Admitting: Gastroenterology

## 2023-08-12 ENCOUNTER — Other Ambulatory Visit: Payer: Self-pay | Admitting: Family Medicine

## 2023-08-16 ENCOUNTER — Other Ambulatory Visit: Payer: Self-pay | Admitting: Family Medicine

## 2023-08-19 ENCOUNTER — Encounter (HOSPITAL_COMMUNITY): Payer: Self-pay

## 2023-08-19 ENCOUNTER — Encounter (HOSPITAL_COMMUNITY)
Admission: RE | Admit: 2023-08-19 | Discharge: 2023-08-19 | Disposition: A | Discharge status: Home / Self Care | Source: Ambulatory Visit | Attending: Gastroenterology | Admitting: Gastroenterology

## 2023-08-21 ENCOUNTER — Ambulatory Visit (HOSPITAL_COMMUNITY): Payer: Self-pay | Admitting: Anesthesiology

## 2023-08-21 ENCOUNTER — Ambulatory Visit (HOSPITAL_COMMUNITY)
Admission: RE | Admit: 2023-08-21 | Discharge: 2023-08-21 | Disposition: A | Discharge status: Home / Self Care | Attending: Gastroenterology | Admitting: Gastroenterology

## 2023-08-21 ENCOUNTER — Encounter (HOSPITAL_COMMUNITY)
Admission: RE | Disposition: A | Discharge status: Home / Self Care | Payer: Self-pay | Source: Home / Self Care | Attending: Gastroenterology

## 2023-08-21 ENCOUNTER — Encounter (HOSPITAL_COMMUNITY): Payer: Self-pay | Admitting: Gastroenterology

## 2023-08-21 ENCOUNTER — Other Ambulatory Visit: Payer: Self-pay

## 2023-08-21 DIAGNOSIS — E119 Type 2 diabetes mellitus without complications: Secondary | ICD-10-CM | POA: Insufficient documentation

## 2023-08-21 DIAGNOSIS — Z7984 Long term (current) use of oral hypoglycemic drugs: Secondary | ICD-10-CM | POA: Diagnosis not present

## 2023-08-21 DIAGNOSIS — Z7985 Long-term (current) use of injectable non-insulin antidiabetic drugs: Secondary | ICD-10-CM | POA: Insufficient documentation

## 2023-08-21 DIAGNOSIS — Z794 Long term (current) use of insulin: Secondary | ICD-10-CM | POA: Insufficient documentation

## 2023-08-21 DIAGNOSIS — K2950 Unspecified chronic gastritis without bleeding: Secondary | ICD-10-CM | POA: Insufficient documentation

## 2023-08-21 DIAGNOSIS — K648 Other hemorrhoids: Secondary | ICD-10-CM | POA: Diagnosis not present

## 2023-08-21 DIAGNOSIS — I10 Essential (primary) hypertension: Secondary | ICD-10-CM | POA: Insufficient documentation

## 2023-08-21 DIAGNOSIS — K200 Eosinophilic esophagitis: Secondary | ICD-10-CM | POA: Insufficient documentation

## 2023-08-21 DIAGNOSIS — Z5986 Financial insecurity: Secondary | ICD-10-CM | POA: Diagnosis not present

## 2023-08-21 DIAGNOSIS — R1310 Dysphagia, unspecified: Secondary | ICD-10-CM | POA: Diagnosis not present

## 2023-08-21 DIAGNOSIS — K5080 Crohn's disease of both small and large intestine without complications: Secondary | ICD-10-CM | POA: Insufficient documentation

## 2023-08-21 DIAGNOSIS — Z79899 Other long term (current) drug therapy: Secondary | ICD-10-CM | POA: Diagnosis not present

## 2023-08-21 DIAGNOSIS — K222 Esophageal obstruction: Secondary | ICD-10-CM

## 2023-08-21 LAB — GLUCOSE, CAPILLARY: Glucose-Capillary: 97 mg/dL (ref 70–99)

## 2023-08-21 SURGERY — EGD (ESOPHAGOGASTRODUODENOSCOPY)
Anesthesia: General

## 2023-08-21 MED ORDER — PROPOFOL 500 MG/50ML IV EMUL
INTRAVENOUS | Status: DC | PRN
  Administered 2023-08-21: 150 ug/kg/min via INTRAVENOUS

## 2023-08-21 MED ORDER — PROPOFOL 500 MG/50ML IV EMUL
INTRAVENOUS | Status: AC
  Filled 2023-08-21: qty 50

## 2023-08-21 MED ORDER — LACTATED RINGERS IV SOLN: INTRAVENOUS | Status: DC

## 2023-08-21 MED ORDER — LIDOCAINE 2% (20 MG/ML) 5 ML SYRINGE
INTRAMUSCULAR | Status: AC
  Filled 2023-08-21: qty 5

## 2023-08-21 MED ORDER — PROPOFOL 10 MG/ML IV BOLUS
INTRAVENOUS | Status: DC | PRN
  Administered 2023-08-21: 50 mg via INTRAVENOUS

## 2023-08-21 MED ORDER — LIDOCAINE 2% (20 MG/ML) 5 ML SYRINGE
INTRAMUSCULAR | Status: DC | PRN
Start: 2023-08-21 — End: 2023-08-21
  Administered 2023-08-21: 50 mg via INTRAVENOUS

## 2023-08-21 NOTE — Op Note (Signed)
 [facility] Patient Name: Jeanette Pierce Procedure Date: 08/21/2023 7:14 AM MRN: [id number] Date of Birth: [birth year] Attending MD: Deatrice Dine , MD, [id number] CSN: [id number] Age: 58 Admit Type: Outpatient Procedure:                Upper GI endoscopy Indications:              Follow-up of eosinophilic esophagitis, For therapy                            of eosinophilic esophagitis Providers:                Deatrice Dine, MD, Tammy Vaught, RN, Mickel CROME.                            Shirlean Balm, Technician Referring MD:              Medicines:                Monitored Anesthesia Care Complications:            No immediate complications. Estimated Blood Loss:     Estimated blood loss was minimal. Procedure:                Pre-Anesthesia Assessment:                           - Prior to the procedure, a History and Physical                            was performed, and patient medications and                            allergies were reviewed. The patient's tolerance of                            previous anesthesia was also reviewed. The risks                            and benefits of the procedure and the sedation                            options and risks were discussed with the patient.                            All questions were answered, and informed consent                            was obtained. Prior Anticoagulants: The patient has                            taken no anticoagulant or antiplatelet agents. ASA                            Grade Assessment: II - A patient with mild systemic  disease. After reviewing the risks and benefits,                            the patient was deemed in satisfactory condition to                            undergo the procedure.                           After obtaining informed consent, the endoscope was                            passed under direct vision. Throughout the                             procedure, the patient's blood pressure, pulse, and                            oxygen saturations were monitored continuously. The                            [id number]-[id number] ([id number]) scope was introduced through the                            mouth, and advanced to the second part of duodenum.                            The upper GI endoscopy was accomplished without                            difficulty. The patient tolerated the procedure                            well. Scope In: 7:43:48 AM Scope Out: 7:51:10 AM Total Procedure Duration: 0 hours 7 minutes 22 seconds  Findings:      Mucosal changes including longitudinal furrows were found in the entire       esophagus. Esophageal findings were graded using the Eosinophilic       Esophagitis Endoscopic Reference Score (EoE-EREFS) as: Edema Grade 0       Normal (distinct vascular markings), Rings Grade 0 None (no ridges or       rings seen), Exudates Grade 0 None (no white lesions seen), Furrows       Grade 1 Mild (vertical lines without visible depth) and Stricture       present (14 mm luminal diameter). Biopsies were obtained from the       proximal and distal esophagus with cold forceps for histology of       suspected eosinophilic esophagitis. A TTS dilator was passed through the       scope. Dilation with a 15-16.5-18 mm balloon dilator was performed to 18       mm. The dilation site was examined following endoscope reinsertion and       showed moderate mucosal disruption, complete resolution of luminal       narrowing and no perforation.      The entire examined stomach was  normal.      The duodenal bulb and second portion of the duodenum were normal. Impression:               - Esophageal mucosal changes secondary to                            eosinophilic esophagitis. Dilated.                           - Normal stomach.                           - Normal duodenal bulb and second portion of the                            duodenum.                            - Biopsies were taken with a cold forceps for                            evaluation of eosinophilic esophagitis. Exam                            appeared significantly improved Moderate Sedation:      Per Anesthesia Care Recommendation:           - Patient has a contact number available for                            emergencies. The signs and symptoms of potential                            delayed complications were discussed with the                            patient. Return to normal activities tomorrow.                            Written discharge instructions were provided to the                            patient.                           - Resume previous diet.                           - Continue present medications.                           - Await pathology results.                           - Repeat upper endoscopy PRN.                           - Return to GI  clinic as previously scheduled.                           -If biopsies demonstrates improved eosinophilia ,                            may titrate PPI to once daily Procedure Code(s):        --- Professional ---                           [phone], Esophagogastroduodenoscopy, flexible,                            transoral; with transendoscopic balloon dilation of                            esophagus (less than 30 mm diameter)                           [id number], 59, Esophagogastroduodenoscopy, flexible,                            transoral; with biopsy, single or multiple Diagnosis Code(s):        --- Professional ---                           K20.0, Eosinophilic esophagitis CPT copyright 2022 American Medical Association. All rights reserved. The codes documented in this report are preliminary and upon coder review may  be revised to meet current compliance requirements. Deatrice Dine, MD Deatrice Dine, MD 08/21/2023 7:56:59 AM This report has been signed electronically. Number of Addenda: 0

## 2023-08-21 NOTE — Anesthesia Preprocedure Evaluation (Signed)
 Anesthesia Evaluation  Patient identified by MRN, date of birth, ID band Patient awake    Reviewed: Allergy & Precautions, H&P , NPO status , Patient's Chart, lab work & pertinent test results, reviewed documented beta blocker date and time   History of Anesthesia Complications (+) PONV and history of anesthetic complications  Airway Mallampati: II  TM Distance: >3 FB Neck ROM: full    Dental no notable dental hx.    Pulmonary neg pulmonary ROS   Pulmonary exam normal breath sounds clear to auscultation       Cardiovascular Exercise Tolerance: Good hypertension, negative cardio ROS  Rhythm:regular Rate:Normal     Neuro/Psych  Headaches negative neurological ROS  negative psych ROS   GI/Hepatic negative GI ROS, Neg liver ROS,GERD  ,,  Endo/Other  diabetes  Class 3 obesity  Renal/GU negative Renal ROS  negative genitourinary   Musculoskeletal   Abdominal   Peds  Hematology negative hematology ROS (+)   Anesthesia Other Findings   Reproductive/Obstetrics negative OB ROS                             Anesthesia Physical Anesthesia Plan  ASA: 3  Anesthesia Plan: General   Post-op Pain Management:    Induction:   PONV Risk Score and Plan: Propofol  infusion  Airway Management Planned:   Additional Equipment:   Intra-op Plan:   Post-operative Plan:   Informed Consent: I have reviewed the patients History and Physical, chart, labs and discussed the procedure including the risks, benefits and alternatives for the proposed anesthesia with the patient or authorized representative who has indicated his/her understanding and acceptance.     Dental Advisory Given  Plan Discussed with: CRNA  Anesthesia Plan Comments:        Anesthesia Quick Evaluation

## 2023-08-21 NOTE — H&P (Signed)
 Primary Care Physician:  Esh, Jayce G, DO Primary Gastroenterologist:  Dr. Cinderella  Pre-Procedure History & Physical: HPI: Jeanette Pierce is a 58 y.o. female with history of ileo-colonic Crohns disease s/p ileo-cecal  resection 1999 , currently not on any maintenance medications  who presents for follow-up of Dysphagia with EOE    Patient reports solid food dysphagia as if food getting stuck in middle of the chest with occasional regurgitation, since starting omeprazole  twice daily symptoms are rarely happening now that she feels a little nauseous   Patient reports that she has 2-3 bowel movements daily alternating from solid to liquid but has been like this for many years.  Patient denies any blood per rectum but has occasional left upper quadrant abdominal discomfort and does feel like a knot.  Patient is unsure on what type of medical treatment she received records disease but was only on pills and never got any injections.  Since 2000 she has never been on any maintenance therapy or followed up with any GI.The patient denies having any ,vomiting, fever, chills, hematochezia, melena, hematemesis, abdominal distention, , jaundice, pruritus or weight loss.   Last EGD:04/2023   - Esophageal mucosal changes suggestive of eosinophilic esophagitis. - Erythematous mucosa in the antrum. Biopsied. - Normal duodenal bulb and second portion of the duodenum. - Biopsies were taken with a cold forceps for evaluation of eosinophilic esophagitis.   Last Colonoscopy:04/2023 - Hemorrhoids found on perianal exam. - One 5 mm polyp in the sigmoid colon, removed with a cold snare. Resected and retrieved. - Patent side- to- side ileo- colonic anastomosis, characterized by healthy appearing mucosa. - Non- bleeding external and internal hemorrhoids. - The examined portion of the ileum was normal. - Biopsies were obtained in the rectum, in the sigmoid colon, in the descending colon, in the transverse colon and in the  ascending colon.   A. STOMACH, BIOPSY: Reactive gastropathy with mild chronic gastritis Negative for H. pylori, intestinal metaplasia, dysplasia carcinoma   B. DISTAL ESOPHAGUS, BIOPSY: Eosinophil-rich esophagitis (see comment) Negative for glandular epithelium, dysplasia and carcinoma   C. PROXIMAL ESOPHAGUS, BIOPSY: Eosinophil-rich esophagitis (see comment) Negative for glandular epithelium, dysplasia and carcinoma   D. RIGHT COLON, BIOPSY: Benign colonic mucosa with no diagnostic abnormality   E. LEFT COLON, BIOPSY: Benign colonic mucosa with no diagnostic abnormality   F. SIGMOID COLON, POLYPECTOMY: Hyperplastic polyp Negative for dysplasia and carcinoma   COMMENT:   The esophageal biopsies both show an eosinophil rich esophagitis with greater than 40 eosinophils within a high-power field.  The differential diagnosis would include true eosinophilic esophagitis (favored) or severe reflux esophagitis.  Clinical and endoscopic correlation is recommended.      FHx: neg for any gastrointestinal/liver disease, no malignancies Social: neg smoking, alcohol or illicit drug use Surgical: Ex lap with ileocecal resection, cholecystectomy  Past Medical History:  Diagnosis Date   Diabetes mellitus without complication (HCC)    Fatty liver    GERD (gastroesophageal reflux disease)    Hypertension    Migraine headache    PONV (postoperative nausea and vomiting)    difficulty waking up   Reflux     Past Surgical History:  Procedure Laterality Date   CHOLECYSTECTOMY  before 1999   COLONOSCOPY WITH PROPOFOL  N/A 05/01/2023   Procedure: COLONOSCOPY WITH PROPOFOL ;  Surgeon: Cinderella Deatrice FALCON, MD;  [facility];  Service: Endoscopy;  Laterality: N/A;  11:00AM;ASA 1-2   ESOPHAGOGASTRODUODENOSCOPY (EGD) WITH PROPOFOL  N/A 05/01/2023   Procedure: ESOPHAGOGASTRODUODENOSCOPY (EGD) WITH  PROPOFOL ;  Surgeon: Cinderella Deatrice FALCON, MD;  [facility];  Service: Endoscopy;   Laterality: N/A;  11:00AM;ASA 1-2   iliocolotomy  02/25/1997   POLYPECTOMY  05/01/2023   Procedure: POLYPECTOMY;  Surgeon: Cinderella Deatrice FALCON, MD;  [facility];  Service: Endoscopy;;   TUBAL LIGATION  02/25/1989    Prior to Admission medications   Medication Sig Start Date End Date Taking? Authorizing Provider  atenolol  (TENORMIN ) 100 MG tablet Take 1 tablet by mouth once daily 06/23/23  Yes Bound, Jayce G, DO  atorvastatin  (LIPITOR) 10 MG tablet Take 1 tablet (10 mg total) by mouth daily. 04/04/23  Yes Shamleffer, Ibtehal Jaralla, MD  glipiZIDE  (GLUCOTROL ) 5 MG tablet Take 1 tablet (5 mg total) by mouth daily before breakfast. 04/04/23  Yes Shamleffer, Ibtehal Jaralla, MD  imipramine  (TOFRANIL ) 25 MG tablet TAKE 4 TABLETS BY MOUTH AT BEDTIME 08/12/23  Yes Lorenzo, Jayce G, DO  Insulin  Glargine (BASAGLAR  KWIKPEN) 100 UNIT/ML Inject 14 Units into the skin daily. 04/04/23  Yes Shamleffer, Ibtehal Jaralla, MD  metFORMIN  (GLUCOPHAGE -XR) 500 MG 24 hr tablet Take 2 tablets (1,000 mg total) by mouth 2 (two) times daily with a meal. 04/04/23  Yes Shamleffer, Ibtehal Jaralla, MD  amLODipine  (NORVASC ) 5 MG tablet Take 1 tablet by mouth once daily 08/18/23   Hoskins, Carolyn C, NP  Insulin  Pen Needle 32G X 4 MM MISC 1 Device by Does not apply route daily in the afternoon. 04/04/23   Shamleffer, Ibtehal Jaralla, MD  omeprazole  (PRILOSEC) 40 MG capsule TAKE 1 CAPSULE BY MOUTH IN THE MORNING AND AT BEDTIME 07/29/23   Pansie Guggisberg, Deatrice FALCON, MD  progesterone  (PROMETRIUM ) 200 MG capsule Take 1 capsule (200 mg total) by mouth daily. 07/14/23   Jayne Vonn DEL, MD  rizatriptan  (MAXALT ) 10 MG tablet Take 1 tablet (10 mg total) by mouth daily as needed (for migraine pain). 05/16/21   Wrubel, Jayce G, DO  Semaglutide  (OZEMPIC , 2 MG/DOSE, [place]) Inject into the skin. 2 mg Once a week    [provider]  Semaglutide , 2 MG/DOSE, (OZEMPIC , 2 MG/DOSE,) 8 MG/3ML SOPN Inject 2 mg into the skin once a week. 06/23/23   Shamleffer,  Donell Cardinal, MD    Allergies as of 07/04/2023 - Review Complete 07/02/2023  Allergen Reaction Noted   Erythromycin Nausea And Vomiting 09/16/2012   Lisinopril  Nausea And Vomiting 10/02/2022    Family History  Problem Relation Age of Onset   Cancer Maternal Grandfather        colon and stomach   COPD Father    Asthma Father    Heart disease Mother    Kidney disease Mother     Social History   Socioeconomic History   Marital status: Married    Spouse name: Not on file   Number of children: Not on file   Years of education: Not on file   Highest education level: 12th grade  Occupational History   Not on file  Tobacco Use   Smoking status: Never   Smokeless tobacco: Never  Vaping Use   Vaping status: Never Used  Substance and Sexual Activity   Alcohol use: Not Currently   Drug use: Not Currently   Sexual activity: Not Currently    Birth control/protection: Post-menopausal    Comment: Married, same female partner but not sexually active  Other Topics Concern   Not on file  Social History Narrative   Not on file   Social Drivers of Corporate investment banker  Strain: Medium Risk (07/02/2023)   Overall Financial Resource Strain (CARDIA)    Difficulty of Paying Living Expenses: Somewhat hard  Food Insecurity: Food Insecurity Present (07/02/2023)   Hunger Vital Sign    Worried About Running Out of Food in the Last Year: Never true    Ran Out of Food in the Last Year: Sometimes true  Transportation Needs: No Transportation Needs (07/02/2023)   PRAPARE - Administrator, Civil Service (Medical): No    Lack of Transportation (Non-Medical): No  Physical Activity: Insufficiently Active (07/02/2023)   Exercise Vital Sign    Days of Exercise per Week: 2 days    Minutes of Exercise per Session: 60 min  Stress: Stress Concern Present (07/02/2023)   [employer] of Occupational Health - Occupational Stress Questionnaire    Feeling of Stress : Very much  Social  Connections: Moderately Isolated (07/02/2023)   Social Connection and Isolation Panel    Frequency of Communication with Friends and Family: More than three times a week    Frequency of Social Gatherings with Friends and Family: Once a week    Attends Religious Services: Never    Database administrator or Organizations: No    Attends Banker Meetings: Never    Marital Status: Married  Catering manager Violence: Not At Risk (07/02/2023)   Humiliation, Afraid, Rape, and Kick questionnaire    Fear of Current or Ex-Partner: No    Emotionally Abused: No    Physically Abused: No    Sexually Abused: No    Review of Systems: See HPI, otherwise negative ROS  Physical Exam: Vital signs in last 24 hours: Temp:  [98.4 F (36.9 C)] 98.4 F (36.9 C) (06/26 0625) Pulse Rate:  [75] 75 (06/26 0625) Resp:  [18] 18 (06/26 0625) BP: (129)/(89) 129/89 (06/26 0625) SpO2:  [98 %] 98 % (06/26 0625) Weight:  [93 kg] 93 kg (06/26 0625)   General:   Alert,  Well-developed, well-nourished, pleasant and cooperative in NAD Head:  Normocephalic and atraumatic. Eyes:  Sclera clear, no icterus.   Conjunctiva pink. Ears:  Normal auditory acuity. Nose:  No deformity, discharge,  or lesions. Msk:  Symmetrical without gross deformities. Normal posture. Extremities:  Without clubbing or edema. Neurologic:  Alert and  oriented x4;  grossly normal neurologically. Skin:  Intact without significant lesions or rashes. Psych:  Alert and cooperative. Normal mood and affect.  Impression/Plan: Jeanette Pierce is a 58 y.o. female with history of ileo-colonic Crohns disease s/p ileo-cecal  resection 1999 , currently not on any maintenance medications  who presents for follow-up of Dysphagia with EOE . Proceed with Egd with dilation/biopsies  The risks of the procedure including infection, bleed, or perforation as well as benefits, limitations, alternatives and imponderables have been reviewed with the patient.  Questions have been answered. All parties agreeable.

## 2023-08-21 NOTE — Discharge Instructions (Signed)

## 2023-08-21 NOTE — Transfer of Care (Signed)
 Immediate Anesthesia Transfer of Care Note  Patient: Jeanette Pierce  Procedure(s) Performed: EGD (ESOPHAGOGASTRODUODENOSCOPY)  Patient Location: PACU  Anesthesia Type:MAC  Level of Consciousness: drowsy and patient cooperative  Airway & Oxygen Therapy: Patient Spontanous Breathing  Post-op Assessment: Report given to RN and Post -op Vital signs reviewed and stable  Post vital signs: Reviewed and stable  Last Vitals:  Vitals Value Taken Time  BP 88/52 08/21/23 07:58  Temp 36.4 C 08/21/23 07:57  Pulse 80 08/21/23 07:58  Resp 20 08/21/23 07:58  SpO2 95% on RA 08/21/23 07:58    Last Pain:  Vitals:   08/21/23 0738  TempSrc:   PainSc: 0-No pain         Complications: No notable events documented.

## 2023-08-22 ENCOUNTER — Ambulatory Visit (INDEPENDENT_AMBULATORY_CARE_PROVIDER_SITE_OTHER): Payer: Self-pay | Admitting: Gastroenterology

## 2023-08-22 ENCOUNTER — Encounter (HOSPITAL_COMMUNITY): Payer: Self-pay | Admitting: Gastroenterology

## 2023-08-22 LAB — SURGICAL PATHOLOGY

## 2023-08-22 NOTE — Anesthesia Postprocedure Evaluation (Signed)
 Anesthesia Post Note  Patient: Jeanette Pierce  Procedure(s) Performed: EGD (ESOPHAGOGASTRODUODENOSCOPY)  Patient location during evaluation: Phase II Anesthesia Type: General Level of consciousness: awake Pain management: pain level controlled Vital Signs Assessment: post-procedure vital signs reviewed and stable Respiratory status: spontaneous breathing and respiratory function stable Cardiovascular status: blood pressure returned to baseline and stable Postop Assessment: no headache and no apparent nausea or vomiting Anesthetic complications: no Comments: Late entry   No notable events documented.   Last Vitals:  Vitals:   08/21/23 0757 08/21/23 0801  BP: (!) 88/52 104/68  Pulse: 74 73  Resp: 19 19  Temp: 36.4 C   SpO2: 94% 94%    Last Pain:  Vitals:   08/21/23 0801  TempSrc:   PainSc: 0-No pain                 Yvonna JINNY Bosworth

## 2023-08-26 NOTE — Progress Notes (Signed)
 Patient result letter mailed Patient's PCP is on EPIC

## 2023-09-21 ENCOUNTER — Other Ambulatory Visit: Payer: Self-pay | Admitting: Family Medicine

## 2023-09-22 ENCOUNTER — Other Ambulatory Visit: Payer: Self-pay

## 2023-09-22 MED ORDER — ATENOLOL 100 MG PO TABS: 100.0000 mg | ORAL_TABLET | Freq: Every day | ORAL | 3 refills | Status: DC

## 2023-09-23 ENCOUNTER — Ambulatory Visit (INDEPENDENT_AMBULATORY_CARE_PROVIDER_SITE_OTHER): Admitting: Gastroenterology

## 2023-10-02 ENCOUNTER — Ambulatory Visit: Payer: BC Managed Care – PPO | Admitting: Internal Medicine

## 2023-10-02 ENCOUNTER — Encounter: Payer: Self-pay | Admitting: Internal Medicine

## 2023-10-02 VITALS — BP 134/76 | HR 80 | Ht 62.0 in | Wt 201.0 lb

## 2023-10-02 DIAGNOSIS — E1129 Type 2 diabetes mellitus with other diabetic kidney complication: Secondary | ICD-10-CM | POA: Diagnosis not present

## 2023-10-02 DIAGNOSIS — Z794 Long term (current) use of insulin: Secondary | ICD-10-CM | POA: Diagnosis not present

## 2023-10-02 DIAGNOSIS — R809 Proteinuria, unspecified: Secondary | ICD-10-CM

## 2023-10-02 DIAGNOSIS — E785 Hyperlipidemia, unspecified: Secondary | ICD-10-CM | POA: Diagnosis not present

## 2023-10-02 DIAGNOSIS — E1142 Type 2 diabetes mellitus with diabetic polyneuropathy: Secondary | ICD-10-CM

## 2023-10-02 LAB — POCT GLYCOSYLATED HEMOGLOBIN (HGB A1C): Hemoglobin A1C: 5.8 % — AB (ref 4.0–5.6)

## 2023-10-02 NOTE — Progress Notes (Unsigned)
 Name: Jeanette Pierce  Age/ Sex: 58 y.o., female   MRN/ DOB: [id number], [birth year]     PCP: Jardin, Jayce G, DO   Reason for Endocrinology Evaluation: Type 2 Diabetes Mellitus  Initial Endocrine Consultative Visit: 10/28/2018    PATIENT IDENTIFIER: [name] is a 58 y.o. female with a past medical history of T2DM and Crohn's dz ( S/p iliocolectomy 1999)  The patient has followed with Endocrinology clinic since 10/28/2018 for consultative assistance with management of her diabetes.  DIABETIC HISTORY:  Jeanette Pierce was diagnosed with DM in 2019, pt had a prescription for metformin  but she admits to not taking it, she was eventually started on insulin  09/2018 during hospitalization for DKA. Her hemoglobin A1c has ranged from 6.2% in 2019, peaking at > 15.5% in 2020.   On her initial presentation to our clinic she was on metformin , lantus  and novolog  with an A1c > 15.5%   We stopped her MDI regimen by 03/2019 with an A1c of 5.9%  Restarted insulin  by 09/2019 after hospitalization for COVID   She was approved for Ozempic  through patient assistance 03/2022  SUBJECTIVE:   During the last visit (04/04/2023): A1c 6.6 %    Today (10/02/2023): Jeanette Pierce is here for a  follow up on diabetes.  She checks her blood sugars 1 times daily. No hypoglycemia  Patient continues with weight loss She is s/p EGD for eosinophilic esophagitis , continues with PPI  Has hx of  crohn's , continues with changes in bowel movements  Has nausea in the morning but no vomiting    HOME ENDOCRINE REGIMEN:  Glipizide  5 mg daily  Metformin  500 mg 2 tabs BID Ozempic  2 mg weekly Basaglar  14  units daily  Atorvastatin  10 mg daily      METER DOWNLOAD SUMMARY: unable to download 89-162 mg/dL    DIABETIC COMPLICATIONS: Microvascular complications:  Neuropathy  Denies: CKD, retinopathy Last eye exam: Completed 08/2022   Macrovascular complications:    Denies: CAD, PVD, CVA      HISTORY:  Past Medical  History:  Past Medical History:  Diagnosis Date   Diabetes mellitus without complication (HCC)    Fatty liver    GERD (gastroesophageal reflux disease)    Hypertension    Migraine headache    PONV (postoperative nausea and vomiting)    difficulty waking up   Reflux    Past Surgical History:  Past Surgical History:  Procedure Laterality Date   CHOLECYSTECTOMY  before 1999   COLONOSCOPY WITH PROPOFOL  N/A 05/01/2023   Procedure: COLONOSCOPY WITH PROPOFOL ;  Surgeon: Cinderella Deatrice FALCON, MD;  [facility];  Service: Endoscopy;  Laterality: N/A;  11:00AM;ASA 1-2   ESOPHAGOGASTRODUODENOSCOPY N/A 08/21/2023   Procedure: EGD (ESOPHAGOGASTRODUODENOSCOPY);  Surgeon: Cinderella Deatrice FALCON, MD;  [facility];  Service: Endoscopy;  Laterality: N/A;  730am, asa 1/2   ESOPHAGOGASTRODUODENOSCOPY (EGD) WITH PROPOFOL  N/A 05/01/2023   Procedure: ESOPHAGOGASTRODUODENOSCOPY (EGD) WITH PROPOFOL ;  Surgeon: Cinderella Deatrice FALCON, MD;  [facility];  Service: Endoscopy;  Laterality: N/A;  11:00AM;ASA 1-2   iliocolotomy  02/25/1997   POLYPECTOMY  05/01/2023   Procedure: POLYPECTOMY;  Surgeon: Cinderella Deatrice FALCON, MD;  [facility];  Service: Endoscopy;;   TUBAL LIGATION  02/25/1989   Social History:  reports that she has never smoked. She has never used smokeless tobacco. She reports that she does not currently use alcohol. She reports that she does not currently use drugs. Family History:  Family History  Problem Relation Age of Onset   Cancer Maternal Grandfather        colon and stomach   COPD Father    Asthma Father    Heart disease Mother    Kidney disease Mother      HOME MEDICATIONS: Allergies as of 10/02/2023       Reactions   Erythromycin Nausea And Vomiting   Nausea, vomiting   Lisinopril  Nausea And Vomiting        Medication List        Accurate as of October 02, 2023  8:29 AM. If you have any questions, ask your nurse or doctor.           amLODipine  5 MG tablet Commonly known as: NORVASC  Take 1 tablet by mouth once daily   atenolol  100 MG tablet Commonly known as: TENORMIN  Take 1 tablet (100 mg total) by mouth daily.   atorvastatin  10 MG tablet Commonly known as: LIPITOR Take 1 tablet (10 mg total) by mouth daily.   Basaglar  KwikPen 100 UNIT/ML Inject 14 Units into the skin daily.   glipiZIDE  5 MG tablet Commonly known as: GLUCOTROL  Take 1 tablet (5 mg total) by mouth daily before breakfast.   imipramine  25 MG tablet Commonly known as: TOFRANIL  TAKE 4 TABLETS BY MOUTH AT BEDTIME   Insulin  Pen Needle 32G X 4 MM Misc 1 Device by Does not apply route daily in the afternoon.   metFORMIN  500 MG 24 hr tablet Commonly known as: GLUCOPHAGE -XR Take 2 tablets (1,000 mg total) by mouth 2 (two) times daily with a meal.   omeprazole  40 MG capsule Commonly known as: PRILOSEC TAKE 1 CAPSULE BY MOUTH IN THE MORNING AND AT BEDTIME   OZEMPIC  (2 MG/DOSE) [place] Inject into the skin. 2 mg Once a week   Ozempic  (2 MG/DOSE) 8 MG/3ML Sopn Generic drug: Semaglutide  (2 MG/DOSE) Inject 2 mg into the skin once a week.   progesterone  200 MG capsule Commonly known as: Prometrium  Take 1 capsule (200 mg total) by mouth daily.   rizatriptan  10 MG tablet Commonly known as: MAXALT  Take 1 tablet (10 mg total) by mouth daily as needed (for migraine pain).         OBJECTIVE:   Vital Signs: BP 134/76 (BP Location: Left Arm, Patient Position: Sitting, Cuff Size: Normal)   Pulse 80   Ht 5' 2 (1.575 m)   Wt 201 lb (91.2 kg)   SpO2 99%   BMI 36.76 kg/m   Wt Readings from Last 3 Encounters:  10/02/23 201 lb (91.2 kg)  08/21/23 205 lb (93 kg)  08/19/23 205 lb (93 kg)     Exam: General: Pt appears well and is in NAD  Lungs: Clear with good BS bilat   Heart: RRR   Extremities: No pretibial edema.   Neuro: MS is good with appropriate affect, pt is alert and Ox3    DM foot exam: 04/04/2023  The skin of the feet is  intact without sores or ulcerations. The pedal pulses are 2+ on right and 2+ on left. The sensation is intact to a screening 5.07, 10 gram monofilament bilaterally    DATA REVIEWED:  Lab Results  Component Value Date   HGBA1C 5.8 (A) 10/02/2023   HGBA1C 6.6 (A) 04/04/2023   HGBA1C 6.3 (A) 10/02/2022     Labs to outside facility 04/11/2023  BUN 16 Creatinine 0.790 GFR 88   ASSESSMENT / PLAN / RECOMMENDATIONS:   1) Type 2 Diabetes  Mellitus,Optimally  controlled, With neuropathic  complications - Most recent A1c of 5.8 %. Goal A1c < 7.0 %.    -Her A1c remains optimal, without hypoglycemia -I will decrease Basaglar  with a goal of discontinuing at some point -She was recently diagnosed with eosinophilic esophagitis on EGD, she does endorse morning nausea, I did offer to switch Ozempic  to Mounjaro, as it is unclear to me if this is caused by Ozempic  versus esophagitis.  Patient would like to remain on Ozempic  as symptoms are mild at this time   MEDICATIONS: -Continue Ozempic  2 mg weekly -Continue Metformin  500 mg 2 tabs BID -Continue glipizide  5 mg, 1 tablet before breakfast -Decrease Basaglar  10 units daily    EDUCATION / INSTRUCTIONS: BG monitoring instructions: Patient is instructed to check her blood sugars 1 times a day , fasting  Call [facility] if: BG persistently < 70  I reviewed the Rule of 15 for the treatment of hypoglycemia in detail with the patient. Literature supplied.   2.Mixed Dyslipidemia    - She was started on Atorvastatin  03/2019 due to elevated Triglycerides 220 mg/dL  -Lipid panel****   Medication Continue Atorvastatin  10 mg daily     F/U in 6 months    Signed electronically by: Stefano Redgie Butts, MD  [facility]  [facility] [address] [place] [place], [place] [address] Phone: [phone] FAX: [phone]   CC: Matuszak, Jayce G, DO [address] [place]  [address] Phone: [phone]  Fax: [phone]  Return to Endocrinology clinic as below: Future Appointments  Date Time Provider Department Center  10/02/2023  8:30 AM Corry Storie, Donell Redgie, MD [id number] None

## 2023-10-02 NOTE — Patient Instructions (Addendum)
-   Continue Ozempic  2 mg weekly  - Continue Metformin  500 mg, 2 tablets Twice daily  - Decrease Basaglar   10 units daily  - Continue  Glipizide  5 mg , 1 tablet before first meal of the day    HOW TO TREAT LOW BLOOD SUGARS (Blood sugar LESS THAN 70 MG/DL) Please follow the RULE OF 15 for the treatment of hypoglycemia treatment (when your (blood sugars are less than 70 mg/dL)   STEP 1: Take 15 grams of carbohydrates when your blood sugar is low, which includes:  3-4 GLUCOSE TABS  OR 3-4 OZ OF JUICE OR REGULAR SODA OR ONE TUBE OF GLUCOSE GEL    STEP 2: RECHECK blood sugar in 15 MINUTES STEP 3: If your blood sugar is still low at the 15 minute recheck --> then, go back to STEP 1 and treat AGAIN with another 15 grams of carbohydrates.

## 2023-10-03 ENCOUNTER — Ambulatory Visit: Payer: Self-pay | Admitting: Internal Medicine

## 2023-10-03 DIAGNOSIS — E785 Hyperlipidemia, unspecified: Secondary | ICD-10-CM | POA: Insufficient documentation

## 2023-10-03 LAB — MICROALBUMIN / CREATININE URINE RATIO
Creatinine, Urine: 247 mg/dL (ref 20–275)
Microalb Creat Ratio: 72 mg/g{creat} — ABNORMAL HIGH (ref <30)
Microalb, Ur: 17.8 mg/dL

## 2023-10-03 LAB — LIPID PANEL
Cholesterol: 151 mg/dL (ref <200)
HDL: 58 mg/dL (ref >=50)
LDL Cholesterol (Calc): 67 mg/dL
Non-HDL Cholesterol (Calc): 93 mg/dL (ref <130)
Total CHOL/HDL Ratio: 2.6 (calc) (ref <5.0)
Triglycerides: 183 mg/dL — ABNORMAL HIGH (ref <150)

## 2023-10-03 MED ORDER — ATORVASTATIN CALCIUM 10 MG PO TABS: 10.0000 mg | ORAL_TABLET | Freq: Every day | ORAL | 3 refills | Status: DC

## 2023-10-16 ENCOUNTER — Other Ambulatory Visit: Payer: Self-pay | Admitting: Internal Medicine

## 2023-10-16 NOTE — Telephone Encounter (Signed)
 Refill request complete

## 2023-10-21 ENCOUNTER — Other Ambulatory Visit (INDEPENDENT_AMBULATORY_CARE_PROVIDER_SITE_OTHER): Payer: Self-pay | Admitting: Gastroenterology

## 2023-10-21 ENCOUNTER — Telehealth (INDEPENDENT_AMBULATORY_CARE_PROVIDER_SITE_OTHER): Payer: Self-pay | Admitting: Gastroenterology

## 2023-10-21 ENCOUNTER — Other Ambulatory Visit (INDEPENDENT_AMBULATORY_CARE_PROVIDER_SITE_OTHER): Payer: Self-pay

## 2023-10-21 MED ORDER — OMEPRAZOLE 40 MG PO CPDR: DELAYED_RELEASE_CAPSULE | ORAL | 0 refills | Status: AC

## 2023-10-21 NOTE — Telephone Encounter (Signed)
 Pt left voicemail to update information. Pt left new address (updated in system) and to let us  know that medications is to be sent to [facility] in [place].

## 2023-11-08 ENCOUNTER — Other Ambulatory Visit: Payer: Self-pay | Admitting: Internal Medicine

## 2023-11-08 ENCOUNTER — Other Ambulatory Visit: Payer: Self-pay | Admitting: Family Medicine

## 2023-11-12 ENCOUNTER — Other Ambulatory Visit: Payer: Self-pay

## 2023-11-12 ENCOUNTER — Telehealth: Payer: Self-pay

## 2023-11-12 DIAGNOSIS — I10 Essential (primary) hypertension: Secondary | ICD-10-CM

## 2023-11-12 DIAGNOSIS — R1319 Other dysphagia: Secondary | ICD-10-CM

## 2023-11-12 DIAGNOSIS — E782 Mixed hyperlipidemia: Secondary | ICD-10-CM

## 2023-11-12 DIAGNOSIS — K219 Gastro-esophageal reflux disease without esophagitis: Secondary | ICD-10-CM

## 2023-11-12 DIAGNOSIS — E119 Type 2 diabetes mellitus without complications: Secondary | ICD-10-CM

## 2023-11-12 DIAGNOSIS — G43109 Migraine with aura, not intractable, without status migrainosus: Secondary | ICD-10-CM

## 2023-11-12 MED ORDER — RIZATRIPTAN BENZOATE 10 MG PO TABS: 10.0000 mg | ORAL_TABLET | Freq: Every day | ORAL | 5 refills | Status: AC | PRN

## 2023-11-12 MED ORDER — AMLODIPINE BESYLATE 5 MG PO TABS: 5.0000 mg | ORAL_TABLET | Freq: Every day | ORAL | 1 refills | Status: AC

## 2023-11-12 MED ORDER — IMIPRAMINE HCL 25 MG PO TABS
100.0000 mg | ORAL_TABLET | Freq: Every day | ORAL | 0 refills | Status: AC
Start: 2023-11-12 — End: ?

## 2023-11-12 MED ORDER — ATENOLOL 100 MG PO TABS: 100.0000 mg | ORAL_TABLET | Freq: Every day | ORAL | 3 refills | Status: AC

## 2023-11-12 NOTE — Telephone Encounter (Signed)
 Copied from CRM [id number]. Topic: General - Other >> Nov 12, 2023  2:22 PM Donna BRAVO wrote: Reason for CRM: patient moved and would like ALL medication refills to got to  [facility]  [address] [place] [address] Phone [phone]

## 2024-01-21 ENCOUNTER — Telehealth: Payer: Self-pay

## 2024-01-21 NOTE — Telephone Encounter (Signed)
 Copied from CRM #[id number]. Topic: Clinical - Medication Question >> Jan 21, 2024  2:59 PM Jeanette Pierce wrote: Pt would like to know if ibuprofen  (ADVIL ) tablet 800 mg can be sent in for her headaches, hasn't been filled by DR. Easom according to epic but pt states it was filled by him in 2023. Last fill in epic 2021. Pt states she uses it when she has a headache instead of using her stronger medication

## 2024-01-23 ENCOUNTER — Other Ambulatory Visit: Payer: Self-pay | Admitting: Family Medicine

## 2024-01-23 MED ORDER — IBUPROFEN 800 MG PO TABS: 800.0000 mg | ORAL_TABLET | Freq: Three times a day (TID) | ORAL | 0 refills | Status: AC | PRN

## 2024-02-07 ENCOUNTER — Other Ambulatory Visit: Payer: Self-pay | Admitting: Family Medicine

## 2024-02-07 DIAGNOSIS — G43109 Migraine with aura, not intractable, without status migrainosus: Secondary | ICD-10-CM

## 2024-02-21 ENCOUNTER — Other Ambulatory Visit: Payer: Self-pay | Admitting: Internal Medicine

## 2024-02-25 ENCOUNTER — Other Ambulatory Visit: Payer: Self-pay | Admitting: Internal Medicine

## 2024-03-22 ENCOUNTER — Other Ambulatory Visit: Payer: Self-pay | Admitting: Internal Medicine

## 2024-04-01 ENCOUNTER — Ambulatory Visit: Admitting: Internal Medicine

## 2024-04-01 ENCOUNTER — Encounter: Payer: Self-pay | Admitting: Internal Medicine

## 2024-04-01 VITALS — BP 136/88 | HR 76 | Ht 62.0 in | Wt 206.0 lb

## 2024-04-01 DIAGNOSIS — E785 Hyperlipidemia, unspecified: Secondary | ICD-10-CM

## 2024-04-01 DIAGNOSIS — E1142 Type 2 diabetes mellitus with diabetic polyneuropathy: Secondary | ICD-10-CM

## 2024-04-01 DIAGNOSIS — R809 Proteinuria, unspecified: Secondary | ICD-10-CM

## 2024-04-01 LAB — POCT GLYCOSYLATED HEMOGLOBIN (HGB A1C): Hemoglobin A1C: 5.9 % — AB (ref 4.0–5.6)

## 2024-04-01 MED ORDER — ATORVASTATIN CALCIUM 10 MG PO TABS: 10.0000 mg | ORAL_TABLET | Freq: Every day | ORAL | 3 refills | Status: AC

## 2024-04-01 MED ORDER — OZEMPIC (2 MG/DOSE) 8 MG/3ML ~~LOC~~ SOPN: 2.0000 mg | PEN_INJECTOR | SUBCUTANEOUS | 3 refills | Status: AC

## 2024-04-01 MED ORDER — METFORMIN HCL ER 500 MG PO TB24: 1000.0000 mg | ORAL_TABLET | Freq: Two times a day (BID) | ORAL | 3 refills | Status: AC

## 2024-04-01 MED ORDER — GLIPIZIDE 5 MG PO TABS: 5.0000 mg | ORAL_TABLET | Freq: Every day | ORAL | 3 refills | Status: AC

## 2024-04-01 NOTE — Progress Notes (Signed)
 "   Name: Jeanette Pierce  Age/ Sex: 59 y.o., female   MRN/ DOB: [id number], [birth year]     PCP: Queener, Jayce G, DO   Reason for Endocrinology Evaluation: Type 2 Diabetes Mellitus  Initial Endocrine Consultative Visit: 10/28/2018    PATIENT IDENTIFIER: [name] is a 59 y.o. female with a past medical history of T2DM and Crohn's dz ( S/p iliocolectomy 1999)  The patient has followed with Endocrinology clinic since 10/28/2018 for consultative assistance with management of her diabetes.  DIABETIC HISTORY:  Jeanette Pierce was diagnosed with DM in 2019, pt had a prescription for metformin  but she admits to not taking it, she was eventually started on insulin  09/2018 during hospitalization for DKA. Her hemoglobin A1c has ranged from 6.2% in 2019, peaking at > 15.5% in 2020.   On her initial presentation to our clinic she was on metformin , lantus  and novolog  with an A1c > 15.5%   We stopped her MDI regimen by 03/2019 with an A1c of 5.9%  Restarted insulin  by 09/2019 after hospitalization for COVID   She was approved for Ozempic  through patient assistance 03/2022  SUBJECTIVE:   During the last visit (10/02/2023): A1c 5.8 %    Today (04/01/2024): Jeanette Pierce is here for a  follow up on diabetes.  She checks her blood sugars 1 times daily. Has noted  hypoglycemia, patient is symptomatic  She has been using [employer] meter through [employer] program that is offered through her spouses work, they cover all her supplies and she is happy with this program   Has occasional nausea, saw GI and was diagnosed with EOE on PPI twice daily  Has hx of  crohn's , continues with changes in bowel movements  Has occasional mild abdominal pain     HOME ENDOCRINE REGIMEN:  Glipizide  5 mg daily  Metformin  500 mg 2 tabs BID Ozempic  2 mg weekly Basaglar  10  units daily  Atorvastatin  10 mg daily      METER DOWNLOAD SUMMARY: unable to download 67 -136 mg/dL    DIABETIC COMPLICATIONS: Microvascular complications:   Neuropathy  Denies: CKD, retinopathy Last eye exam: Completed 2025   Macrovascular complications:    Denies: CAD, PVD, CVA      HISTORY:  Past Medical History:  Past Medical History:  Diagnosis Date   Diabetes mellitus without complication (HCC)    Fatty liver    GERD (gastroesophageal reflux disease)    Hypertension    Migraine headache    PONV (postoperative nausea and vomiting)    difficulty waking up   Reflux    Past Surgical History:  Past Surgical History:  Procedure Laterality Date   CHOLECYSTECTOMY  before 1999   COLONOSCOPY WITH PROPOFOL  N/A 05/01/2023   Procedure: COLONOSCOPY WITH PROPOFOL ;  Surgeon: Cinderella Deatrice FALCON, MD;  [facility];  Service: Endoscopy;  Laterality: N/A;  11:00AM;ASA 1-2   ESOPHAGOGASTRODUODENOSCOPY N/A 08/21/2023   Procedure: EGD (ESOPHAGOGASTRODUODENOSCOPY);  Surgeon: Cinderella Deatrice FALCON, MD;  Location: [facility];  Service: Endoscopy;  Laterality: N/A;  730am, asa 1/2   ESOPHAGOGASTRODUODENOSCOPY (EGD) WITH PROPOFOL  N/A 05/01/2023   Procedure: ESOPHAGOGASTRODUODENOSCOPY (EGD) WITH PROPOFOL ;  Surgeon: Cinderella Deatrice FALCON, MD;  Location: [facility];  Service: Endoscopy;  Laterality: N/A;  11:00AM;ASA 1-2   iliocolotomy  02/25/1997   POLYPECTOMY  05/01/2023   Procedure: POLYPECTOMY;  Surgeon: Cinderella Deatrice FALCON, MD;  Location: [facility];  Service: Endoscopy;;   TUBAL LIGATION  02/25/1989   Social History:  reports that she has never smoked. She has never used smokeless tobacco. She reports that she does not currently use alcohol. She reports that she does not currently use drugs. Family History:  Family History  Problem Relation Age of Onset   Cancer Maternal Grandfather        colon and stomach   COPD Father    Asthma Father    Heart disease Mother    Kidney disease Mother      HOME MEDICATIONS: Allergies as of 04/01/2024       Reactions   Erythromycin Nausea And Vomiting   Nausea, vomiting   Lisinopril  Nausea  And Vomiting        Medication List        Accurate as of April 01, 2024  9:14 AM. If you have any questions, ask your nurse or doctor.          STOP taking these medications    Basaglar  KwikPen 100 UNIT/ML Stopped by: Donell Butts, MD   Insulin  Pen Needle 32G X 4 MM Misc Stopped by: Donell Butts, MD       TAKE these medications    amLODipine  5 MG tablet Commonly known as: NORVASC  Take 1 tablet (5 mg total) by mouth daily.   atenolol  100 MG tablet Commonly known as: TENORMIN  Take 1 tablet (100 mg total) by mouth daily.   atorvastatin  10 MG tablet Commonly known as: LIPITOR Take 1 tablet (10 mg total) by mouth daily.   glipiZIDE  5 MG tablet Commonly known as: GLUCOTROL  Take 1 tablet (5 mg total) by mouth daily before breakfast.   ibuprofen  800 MG tablet Commonly known as: ADVIL  Take 1 tablet (800 mg total) by mouth every 8 (eight) hours as needed for headache.   imipramine  25 MG tablet Commonly known as: TOFRANIL  TAKE 4 TABLETS BY MOUTH AT BEDTIME   metFORMIN  500 MG 24 hr tablet Commonly known as: GLUCOPHAGE -XR Take 2 tablets (1,000 mg total) by mouth 2 (two) times daily with a meal.   omeprazole  40 MG capsule Commonly known as: PRILOSEC Take one capsule po in the morning and at bedtime   Ozempic  (2 MG/DOSE) 8 MG/3ML Sopn Generic drug: Semaglutide  (2 MG/DOSE) 2 mg by Other route once a week. What changed:  See the new instructions. Another medication with the same name was removed. Continue taking this medication, and follow the directions you see here. Changed by: Donell Butts, MD   progesterone  200 MG capsule Commonly known as: Prometrium  Take 1 capsule (200 mg total) by mouth daily.   rizatriptan  10 MG tablet Commonly known as: MAXALT  Take 1 tablet (10 mg total) by mouth daily as needed (for migraine pain).         OBJECTIVE:   Vital Signs: BP 136/88   Pulse 76   Ht 5' 2 (1.575 m)   Wt 206 lb (93.4 kg)    SpO2 97%   BMI 37.68 kg/m   Wt Readings from Last 3 Encounters:  04/01/24 206 lb (93.4 kg)  10/02/23 201 lb (91.2 kg)  08/21/23 205 lb (93 kg)     Exam: General: Pt appears well and is in NAD  Lungs: Clear with good BS bilat   Heart: RRR   Extremities: No pretibial edema.   Neuro: MS is good with appropriate affect, pt is alert and Ox3    DM foot exam: 04/01/2024  The skin of the feet is intact without sores or ulcerations. The pedal pulses are 2+ on right and  2+ on left. The sensation is intact to a screening 5.07, 10 gram monofilament bilaterally    DATA REVIEWED:  Lab Results  Component Value Date   HGBA1C 5.9 (A) 04/01/2024   HGBA1C 5.8 (A) 10/02/2023   HGBA1C 6.6 (A) 04/04/2023    Latest Reference Range & Units 10/02/23 08:43  Total CHOL/HDL Ratio <5.0 (calc) 2.6  Cholesterol <200 mg/dL 848  HDL Cholesterol > OR = 50 mg/dL 58  LDL Cholesterol (Calc) mg/dL (calc) 67  MICROALB/CREAT RATIO <30 mg/g creat 72 (H)  Non-HDL Cholesterol (Calc) <130 mg/dL (calc) 93  Triglycerides <150 mg/dL 816 (H)    Latest Reference Range & Units 10/02/23 08:43  Microalb, Ur mg/dL 82.1  MICROALB/CREAT RATIO <30 mg/g creat 72 (H)  Creatinine, Urine 20 - 275 mg/dL 752   Old records , labs and images have been reviewed.    ASSESSMENT / PLAN / RECOMMENDATIONS:   1) Type 2 Diabetes Mellitus,Optimally  controlled, With neuropathic  complications - Most recent A1c of 5.9%. Goal A1c < 7.0 %.    -Her A1c remains optimal, patient has been noted with hypoglycemia, I will discontinue Basaglar  as she is already on 10 units -Her GI symptoms have been attributed to eosinophilic esophagitis rather than Ozempic  - No changes to glipizide  or metformin   MEDICATIONS: -Continue Ozempic  2 mg weekly -Continue Metformin  500 mg 2 tabs BID -Continue glipizide  5 mg, 1 tablet before breakfast - Stop Basaglar  10 units daily    EDUCATION / INSTRUCTIONS: BG monitoring instructions: Patient is  instructed to check her blood sugars 1 times a day , fasting  Call [facility] if: BG persistently < 70  I reviewed the Rule of 15 for the treatment of hypoglycemia in detail with the patient. Literature supplied.   2.Mixed Dyslipidemia    - She was started on Atorvastatin  03/2019 due to elevated Triglycerides 220 mg/dL  -Lipid panel shows optimal LDL, with continued elevation of triglycerides   Medication Continue Atorvastatin  10 mg daily   3.  Microalbuminuria:  - Slight elevation in MA/CR ratio - She is not on an ACEi nor an ARB - Will continue to monitor at this time, if this does not resolve we will add either an ACE inhibitor or an ARB  F/U in 6 months    Signed electronically by: Stefano Redgie Butts, MD  [facility]  [facility] [address] [place], [place] [address] Phone: [phone] FAX: [phone]   CC: Streater, Jayce G, DO [address] [place] [address] Phone: [phone]  Fax: [phone]  Return to Endocrinology clinic as below: Future Appointments  Date Time Provider Department Center  09/30/2024 10:10 AM Mekhi Sonn, Donell Redgie, MD [id number] None      "

## 2024-04-01 NOTE — Patient Instructions (Signed)
-   Stop basaglar   - Continue Ozempic  2 mg weekly  - Continue Metformin  500 mg, 2 tablets Twice daily  - Continue  Glipizide  5 mg , 1 tablet before first meal of the day    HOW TO TREAT LOW BLOOD SUGARS (Blood sugar LESS THAN 70 MG/DL) Please follow the RULE OF 15 for the treatment of hypoglycemia treatment (when your (blood sugars are less than 70 mg/dL)   STEP 1: Take 15 grams of carbohydrates when your blood sugar is low, which includes:  3-4 GLUCOSE TABS  OR 3-4 OZ OF JUICE OR REGULAR SODA OR ONE TUBE OF GLUCOSE GEL    STEP 2: RECHECK blood sugar in 15 MINUTES STEP 3: If your blood sugar is still low at the 15 minute recheck --> then, go back to STEP 1 and treat AGAIN with another 15 grams of carbohydrates.

## 2024-09-30 ENCOUNTER — Ambulatory Visit: Admitting: Internal Medicine
